# Patient Record
Sex: Male | Born: 1940 | Race: White | Hispanic: No | Smoking: Former smoker
Health system: Southern US, Community
[De-identification: ages and names within clinical notes are randomized; demographics above are authoritative.]

## PROBLEM LIST (undated history)

## (undated) DIAGNOSIS — C679 Malignant neoplasm of bladder, unspecified: Secondary | ICD-10-CM

## (undated) DIAGNOSIS — I10 Essential (primary) hypertension: Secondary | ICD-10-CM

## (undated) HISTORY — PX: BACK SURGERY: SHX140

## (undated) HISTORY — PX: CATARACT EXTRACTION: SUR2

## (undated) HISTORY — PX: HERNIA REPAIR: SHX51

## (undated) HISTORY — PX: TONSILLECTOMY: SUR1361

---

## 2011-06-16 DIAGNOSIS — C4449 Other specified malignant neoplasm of skin of scalp and neck: Secondary | ICD-10-CM | POA: Insufficient documentation

## 2012-08-08 DIAGNOSIS — M4712 Other spondylosis with myelopathy, cervical region: Secondary | ICD-10-CM | POA: Insufficient documentation

## 2017-05-18 DIAGNOSIS — N183 Chronic kidney disease, stage 3 unspecified: Secondary | ICD-10-CM | POA: Insufficient documentation

## 2017-05-18 DIAGNOSIS — Z0000 Encounter for general adult medical examination without abnormal findings: Secondary | ICD-10-CM | POA: Insufficient documentation

## 2017-05-18 DIAGNOSIS — E7800 Pure hypercholesterolemia, unspecified: Secondary | ICD-10-CM | POA: Insufficient documentation

## 2017-05-18 DIAGNOSIS — I10 Essential (primary) hypertension: Secondary | ICD-10-CM | POA: Insufficient documentation

## 2017-11-01 DIAGNOSIS — R7303 Prediabetes: Secondary | ICD-10-CM | POA: Insufficient documentation

## 2017-11-01 DIAGNOSIS — R739 Hyperglycemia, unspecified: Secondary | ICD-10-CM | POA: Insufficient documentation

## 2018-04-03 DIAGNOSIS — C4499 Other specified malignant neoplasm of skin, unspecified: Secondary | ICD-10-CM

## 2018-04-03 HISTORY — DX: Other specified malignant neoplasm of skin, unspecified: C44.99

## 2018-10-02 DIAGNOSIS — C4491 Basal cell carcinoma of skin, unspecified: Secondary | ICD-10-CM

## 2018-10-02 HISTORY — DX: Basal cell carcinoma of skin, unspecified: C44.91

## 2018-11-14 ENCOUNTER — Other Ambulatory Visit: Payer: Self-pay | Admitting: Physician Assistant

## 2018-11-14 DIAGNOSIS — R310 Gross hematuria: Secondary | ICD-10-CM

## 2018-11-23 ENCOUNTER — Ambulatory Visit
Admission: RE | Admit: 2018-11-23 | Discharge: 2018-11-23 | Disposition: A | Discharge status: Home / Self Care | Payer: Medicare Other | Source: Ambulatory Visit | Attending: Physician Assistant | Admitting: Physician Assistant

## 2018-11-23 DIAGNOSIS — R310 Gross hematuria: Secondary | ICD-10-CM | POA: Insufficient documentation

## 2018-11-23 HISTORY — DX: Essential (primary) hypertension: I10

## 2018-11-23 LAB — POCT I-STAT CREATININE: Creatinine, Ser: 0.9 mg/dL (ref 0.61–1.24)

## 2018-11-23 MED ORDER — IOPAMIDOL (ISOVUE-300) INJECTION 61%
125.0000 mL | Freq: Once | INTRAVENOUS | Status: AC | PRN
  Administered 2018-11-23: 125 mL via INTRAVENOUS

## 2018-12-01 ENCOUNTER — Encounter

## 2018-12-01 ENCOUNTER — Ambulatory Visit (INDEPENDENT_AMBULATORY_CARE_PROVIDER_SITE_OTHER): Payer: Medicare Other | Admitting: Urology

## 2018-12-01 ENCOUNTER — Ambulatory Visit
Admission: RE | Admit: 2018-12-01 | Discharge: 2018-12-01 | Disposition: A | Discharge status: Home / Self Care | Payer: Medicare Other | Source: Ambulatory Visit | Attending: Urology | Admitting: Urology

## 2018-12-01 ENCOUNTER — Other Ambulatory Visit: Payer: Self-pay

## 2018-12-01 ENCOUNTER — Ambulatory Visit
Admission: RE | Admit: 2018-12-01 | Discharge: 2018-12-01 | Disposition: A | Discharge status: Home / Self Care | Payer: Medicare Other | Attending: Urology | Admitting: Urology

## 2018-12-01 ENCOUNTER — Encounter: Payer: Self-pay | Admitting: Urology

## 2018-12-01 VITALS — BP 163/78 | HR 82 | Ht 68.0 in | Wt 191.0 lb

## 2018-12-01 DIAGNOSIS — D494 Neoplasm of unspecified behavior of bladder: Secondary | ICD-10-CM | POA: Diagnosis not present

## 2018-12-01 DIAGNOSIS — N3289 Other specified disorders of bladder: Secondary | ICD-10-CM

## 2018-12-01 LAB — URINALYSIS, COMPLETE
Bilirubin, UA: NEGATIVE
Glucose, UA: NEGATIVE
Ketones, UA: NEGATIVE
Nitrite, UA: NEGATIVE
Specific Gravity, UA: 1.025 (ref 1.005–1.030)
Urobilinogen, Ur: 1 mg/dL (ref 0.2–1.0)
pH, UA: 6.5 (ref 5.0–7.5)

## 2018-12-01 LAB — MICROSCOPIC EXAMINATION
Epithelial Cells (non renal): NONE SEEN /hpf (ref 0–10)
RBC, UA: >30 /hpf — ABNORMAL HIGH (ref 0–2)

## 2018-12-01 NOTE — Progress Notes (Signed)
12/01/2018 3:53 PM   [name] [birth year] [id number]  Referring provider: Kirk Ruths, MD [facility] [place],  [id number]  CC: Hematuria, bladder mass  HPI: I saw [name] in urology clinic today in consultation for hematuria and bladder mass from [place], [place]. he is a very healthy and active 78 year old male with past medical history notable for hypertension and hypercholesterolemia, and past surgical history notable for right inguinal hernia repair with mesh, who presents with intermittent gross hematuria since mid November.  He reports it worsened acutely in mid December which caused him to present to his primary at which time he was having eraser sized blood clots in the urine, however his urine has returned to light pink in color.  He reports some increased urinary frequency.  He has a 60-pack-year smoking history, and quit 25 years ago.  Denies any carcinogenic exposures.  He is retired, widowed, and lives alone in [place].  He denies any weight loss, chest pain, or shortness of breath.  There are no aggravating or alleviating factors.  Severity is moderate to severe.  CT urogram was performed 11/23/2018 which showed a large 7 cm anterior bladder mass.  There was no hydronephrosis or evidence of metastatic disease.  PSA 10/2018 was 1.64.  There is no family history of prostate or bladder cancer.   PMH: Past Medical History:  Diagnosis Date  . Hypertension     Surgical History: Past Surgical History:  Procedure Laterality Date  . BACK SURGERY    . CATARACT EXTRACTION    . HERNIA REPAIR    . TONSILLECTOMY      Allergies: No Known Allergies  Family History: History reviewed. No pertinent family history.  Social History:  reports that he quit smoking about 25 years ago. His smoking use included cigarettes. He quit after 35.00 years of use. He has never used smokeless tobacco. He reports current alcohol use. He  reports that he does not use drugs.  ROS: Please see flowsheet from today's date for complete review of systems.  Physical Exam: BP (!) 163/78   Pulse 82   Ht 5\' 8"  (1.727 m)   Wt 191 lb (86.6 kg)   BMI 29.04 kg/m    Constitutional:  Alert and oriented, No acute distress. Cardiovascular: Regular rate and rhythm Respiratory: Clear to auscultation bilaterally GI: Abdomen is soft, nontender, nondistended, no abdominal masses GU: No CVA tenderness Lymph: No cervical or inguinal lymphadenopathy. Skin: No rashes, bruises or suspicious lesions. Neurologic: Grossly intact, no focal deficits, moving all 4 extremities. Psychiatric: Normal mood and affect.  Laboratory Data: Reviewed  Pertinent Imaging: I have personally reviewed the CT urogram dated 11/23/2018.  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  There is no hydronephrosis, lymphadenopathy, or evidence of metastatic disease.  Chest x-ray today with no evidence of metastatic disease.  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  We had a long discussion today about these findings.  We discussed the next stage and diagnosis is a TURBT for pathology and to remove the mass.  We discussed the risks of surgery including bleeding, infection, need for temporary catheter placement up to 1 to 2 weeks, and bladder injury or perforation.  We discussed the next steps  in treatment will be based on pathology results with staging.  We discussed the difference between non-muscle invasive and muscle invasive bladder cancer, and typically endoscopic management plus or minus BCG or intravesical chemotherapy for non-muscle invasive disease, versus radical cystoprostatectomy with possible neoadjuvant chemotherapy or tri-modal therapy with  TURBT/chemotherapy/radiation for muscle invasive disease.  Schedule TURBT, plan to admit overnight  Billey Co, MD  [facility] [address], [place], [place] [address] [phone]

## 2018-12-01 NOTE — Patient Instructions (Signed)
Transurethral Resection of Bladder Tumor, Care After This sheet gives you information about how to care for yourself after your procedure. Your health care provider may also give you more specific instructions. If you have problems or questions, contact your health care provider. What can I expect after the procedure? After the procedure, it is common to have:  A small amount of blood in your urine for up to 2 weeks.  Soreness or mild pain from your catheter. After your catheter is removed, you may have mild soreness, especially when urinating.  Pain in your lower abdomen. Follow these instructions at home: Medicines   Take over-the-counter and prescription medicines only as told by your health care provider.  If you were prescribed an antibiotic medicine, take it as told by your health care provider. Do not stop taking the antibiotic even if you start to feel better.  Do not drive for 24 hours if you were given a sedative during your procedure.  Ask your health care provider if the medicine prescribed to you: ? Requires you to avoid driving or using heavy machinery. ? Can cause constipation. You may need to take these actions to prevent or treat constipation:  Take over-the-counter or prescription medicines.  Eat foods that are high in fiber, such as beans, whole grains, and fresh fruits and vegetables.  Limit foods that are high in fat and processed sugars, such as fried or sweet foods. Activity  Return to your normal activities as told by your health care provider. Ask your health care provider what activities are safe for you.  Do not lift anything that is heavier than 10 lb (4.5 kg), or the limit that you are told, until your health care provider says that it is safe.  Avoid intense physical activity for as long as told by your health care provider.  Rest as told by your health care provider.  Avoid sitting for a long time without moving. Get up to take short walks every  1-2 hours. This is important to improve blood flow and breathing. Ask for help if you feel weak or unsteady. General instructions   Do not drink alcohol for as long as told by your health care provider. This is especially important if you are taking prescription pain medicines.  Do not take baths, swim, or use a hot tub until your health care provider approves. Ask your health care provider if you may take showers. You may only be allowed to take sponge baths.  If you have a catheter, follow instructions from your health care provider about caring for your catheter and your drainage bag.  Drink enough fluid to keep your urine pale yellow.  Wear compression stockings as told by your health care provider. These stockings help to prevent blood clots and reduce swelling in your legs.  Keep all follow-up visits as told by your health care provider. This is important. ? You will need to be followed closely with regular checks of your bladder and urethra (cystoscopies) to make sure that the cancer does not come back. Contact a health care provider if:  You have pain that gets worse or does not improve with medicine.  You have blood in your urine for more than 2 weeks.  You have cloudy or bad-smelling urine.  You become constipated. Signs of constipation may include having: ? Fewer than three bowel movements in a week. ? Difficulty having a bowel movement. ? Stools that are dry, hard, or larger than normal.  You have  a fever. Get help right away if:  You have: ? Severe pain. ? Bright red blood in your urine. ? Blood clots in your urine. ? A lot of blood in your urine.  Your catheter has been removed and you are not able to urinate.  You have a catheter in place and the catheter is not draining urine. Summary  After your procedure, it is common to have a small amount of blood in your urine, soreness or mild pain from your catheter, and pain in your lower abdomen.  Take  over-the-counter and prescription medicines only as told by your health care provider.  Rest as told by your health care provider. Follow your health care provider's instructions about returning to normal activities. Ask what activities are safe for you.  If you have a catheter, follow instructions from your health care provider about caring for your catheter and your drainage bag.  Get help right away if you cannot urinate, you have severe pain, or you have bright red blood or blood clots in your urine. This information is not intended to replace advice given to you by your health care provider. Make sure you discuss any questions you have with your health care provider. Document Released: 10/27/2015 Document Revised: 06/15/2018 Document Reviewed: 06/15/2018 Elsevier Interactive Patient Education  2019 [employer] Transurethral Resection of Bladder Tumor  Transurethral resection of a bladder tumor is the removal (resection) of a cancerous growth (tumor) on the inside wall of the bladder. The bladder is the organ that holds urine. The tumor is removed through the tube that carries urine out of the body (urethra). In a transurethral resection, a thin telescope with a light, a tiny camera, and an electric cutting edge (resectoscope) is passed through the urethra. In men, the opening of the urethra is at the end of the penis. In women, it is just above the opening of the vagina. Tell a health care provider about:  Any allergies you have.  All medicines you are taking, including vitamins, herbs, eye drops, creams, and over-the-counter medicines.  Any problems you or family members have had with anesthetic medicines.  Any blood disorders you have.  Any surgeries you have had.  Any medical conditions you have.  Any recent urinary tract infections you have had.  Whether you are pregnant or may be pregnant. What are the risks? Generally, this is a safe procedure. However, problems may  occur, including:  Infection.  Bleeding.  Allergic reactions to medicines.  Damage to nearby structures or organs, such as: ? The urethra. ? The tubes that drain urine from the kidneys into the bladder (ureters).  Pain and burning during urination.  Difficulty urinating due to partial blockage of the urethra.  Inability to urinate (urinary retention). What happens before the procedure? Staying hydrated Follow instructions from your health care provider about hydration, which may include:  Up to 2 hours before the procedure - you may continue to drink clear liquids, such as water, clear fruit juice, black coffee, and plain tea.  Eating and drinking restrictions Follow instructions from your health care provider about eating and drinking, which may include:  8 hours before the procedure - stop eating heavy meals or foods, such as meat, fried foods, or fatty foods.  6 hours before the procedure - stop eating light meals or foods, such as toast or cereal.  6 hours before the procedure - stop drinking milk or drinks that contain milk.  2 hours before the procedure - stop   drinking clear liquids. Medicines Ask your health care provider about:  Changing or stopping your regular medicines. This is especially important if you are taking diabetes medicines or blood thinners.  Taking medicines such as aspirin and ibuprofen. These medicines can thin your blood. Do not take these medicines unless your health care provider tells you to take them.  Taking over-the-counter medicines, vitamins, herbs, and supplements. Tests You may have exams or tests, including:  Physical exam.  Blood tests.  Urine tests.  Electrocardiogram (ECG). This test measures the electrical activity of the heart. General instructions  Plan to have someone take you home from the hospital or clinic.  Ask your health care provider how your surgical site will be marked or identified.  Ask your health care  provider what steps will be taken to help prevent infection. These may include: ? Washing skin with a germ-killing soap. ? Taking antibiotic medicine. What happens during the procedure?  An IV will be inserted into one of your veins.  You will be given one or more of the following: ? A medicine to help you relax (sedative). ? A medicine to make you fall asleep (general anesthetic). ? A medicine that is injected into your spine to numb the area below and slightly above the injection site (spinal anesthetic).  Your legs will be placed in foot rests (stirrups) so that your legs are apart and your knees are bent.  The resectoscope will be passed through your urethra and into your bladder.  The part of your bladder that is affected by the tumor will be resected using the cutting edge of the resectoscope.  The resectoscope will be removed.  A thin, flexible tube (catheter) will be passed through your urethra and into your bladder. The catheter will drain urine into a bag outside of your body. ? Fluid may be passed through the catheter to keep the catheter open. The procedure may vary among health care providers and hospitals. What happens after the procedure?  Your blood pressure, heart rate, breathing rate, and blood oxygen level will be monitored until you leave the hospital or clinic.  You may continue to receive fluids and medicines through an IV.  You will have some pain. You will be given pain medicine to relieve pain.  You will have a catheter to drain your urine. ? You will have blood in your urine. Your catheter may be kept in until your urine is clear. ? The amount of urine will be monitored. If necessary, your bladder may be rinsed out (irrigated) by passing fluid through your catheter.  You will be encouraged to walk around as soon as possible.  You may have to wear compression stockings. These stockings help to prevent blood clots and reduce swelling in your legs.  Do  not drive for 24 hours if you were given a sedative during your procedure. Summary  Transurethral resection of a bladder tumor is the removal (resection) of a cancerous growth (tumor) on the inside wall of the bladder.  To do this procedure, your health care provider uses a thin telescope with a light, a tiny camera, and an electric cutting edge (resectoscope).  Follow your health care provider's instructions. You may need to stop or change certain medicines, and you may be told to stop eating and drinking several hours before the procedure.  Your blood pressure, heart rate, breathing rate, and blood oxygen level will be monitored until you leave the hospital or clinic.  You may have to wear   compression stockings. These stockings help to prevent blood clots and reduce swelling in your legs. This information is not intended to replace advice given to you by your health care provider. Make sure you discuss any questions you have with your health care provider. Document Released: 09/11/2009 Document Revised: 06/16/2018 Document Reviewed: 06/16/2018 Elsevier Interactive Patient Education  2019 Elsevier Inc.  

## 2018-12-04 ENCOUNTER — Other Ambulatory Visit: Payer: Self-pay | Admitting: Radiology

## 2018-12-04 ENCOUNTER — Encounter
Admission: RE | Admit: 2018-12-04 | Discharge: 2018-12-04 | Disposition: A | Discharge status: Home / Self Care | Payer: Medicare Other | Source: Ambulatory Visit | Attending: Urology | Admitting: Urology

## 2018-12-04 ENCOUNTER — Other Ambulatory Visit: Payer: Medicare Other

## 2018-12-04 ENCOUNTER — Other Ambulatory Visit: Payer: Self-pay

## 2018-12-04 DIAGNOSIS — D494 Neoplasm of unspecified behavior of bladder: Secondary | ICD-10-CM

## 2018-12-04 LAB — URINALYSIS, COMPLETE
Bilirubin, UA: NEGATIVE
GLUCOSE, UA: NEGATIVE
Ketones, UA: NEGATIVE
Nitrite, UA: NEGATIVE
Protein, UA: NEGATIVE
Specific Gravity, UA: 1.02 (ref 1.005–1.030)
Urobilinogen, Ur: 1 mg/dL (ref 0.2–1.0)
pH, UA: 7 (ref 5.0–7.5)

## 2018-12-04 LAB — MICROSCOPIC EXAMINATION: RBC, UA: >30 /hpf — ABNORMAL HIGH (ref 0–2)

## 2018-12-04 NOTE — Patient Instructions (Signed)
Your procedure is scheduled on: 12/08/18 Report to Day Surgery. [facility] SECOND FLOOR To find out your arrival time please call [phone] between 1PM - 3PM on 12/07/18.  Remember: Instructions that are not followed completely may result in serious medical risk,  up to and including death, or upon the discretion of your surgeon and anesthesiologist your  surgery may need to be rescheduled.     _X__ 1. Do not eat food after midnight the night before your procedure.                 No gum chewing or hard candies. You may drink clear liquids up to 2 hours                 before you are scheduled to arrive for your surgery- DO not drink clear                 liquids within 2 hours of the start of your surgery.                 Clear Liquids include:  water, apple juice without pulp, clear carbohydrate                 drink such as Clearfast of Gatorade, Black Coffee or Tea (Do not add                 anything to coffee or tea).  __X__2.  On the morning of surgery brush your teeth with toothpaste and water, you                may rinse your mouth with mouthwash if you wish.  Do not swallow any toothpaste of mouthwash.     _X__ 3.  No Alcohol for 24 hours before or after surgery.   _X__ 4.  Do Not Smoke or use e-cigarettes For 24 Hours Prior to Your Surgery.                 Do not use any chewable tobacco products for at least 6 hours prior to                 surgery.  ____  5.  Bring all medications with you on the day of surgery if instructed.   _X_  6.  Notify your doctor if there is any change in your medical condition      (cold, fever, infections).     Do not wear jewelry, make-up, hairpins, clips or nail polish. Do not wear lotions, powders, or perfumes. You may wear deodorant. Do not shave 48 hours prior to surgery. Men may shave face and neck. Do not bring valuables to the hospital.    [facility] is not responsible for any belongings or  valuables.  Contacts, dentures or bridgework may not be worn into surgery. Leave your suitcase in the car. After surgery it may be brought to your room. For patients admitted to the hospital, discharge time is determined by your treatment team.   Patients discharged the day of surgery will not be allowed to drive home.   _X___ Take these medicines the morning of surgery with A SIP OF WATER:    1. NONE  2.   3.   4.  5.  6.  ____ Fleet Enema (as directed)   ____ Use CHG Soap as directed  ____ Use inhalers on the day of surgery  ____ Stop metformin 2 days prior to surgery  ____ Take 1/2 of usual insulin dose the night before surgery. No insulin the morning          of surgery.   _X___ Stop Coumadin/Plavix/aspirin on  ASPIRIN ALREADY ON HOLD FOR SURGERY  ____ Stop Anti-inflammatories on   ____ Stop supplements until after surgery.    ____ Bring C-Pap to the hospital.

## 2018-12-05 ENCOUNTER — Encounter
Admission: RE | Admit: 2018-12-05 | Discharge: 2018-12-05 | Disposition: A | Discharge status: Home / Self Care | Payer: Medicare Other | Source: Ambulatory Visit | Attending: Urology | Admitting: Urology

## 2018-12-05 DIAGNOSIS — M4712 Other spondylosis with myelopathy, cervical region: Secondary | ICD-10-CM | POA: Diagnosis not present

## 2018-12-05 DIAGNOSIS — E7800 Pure hypercholesterolemia, unspecified: Secondary | ICD-10-CM | POA: Diagnosis not present

## 2018-12-05 DIAGNOSIS — Z7982 Long term (current) use of aspirin: Secondary | ICD-10-CM | POA: Diagnosis not present

## 2018-12-05 DIAGNOSIS — I10 Essential (primary) hypertension: Secondary | ICD-10-CM

## 2018-12-05 DIAGNOSIS — R9431 Abnormal electrocardiogram [ECG] [EKG]: Secondary | ICD-10-CM

## 2018-12-05 DIAGNOSIS — C679 Malignant neoplasm of bladder, unspecified: Secondary | ICD-10-CM | POA: Diagnosis present

## 2018-12-05 DIAGNOSIS — C673 Malignant neoplasm of anterior wall of bladder: Secondary | ICD-10-CM | POA: Diagnosis not present

## 2018-12-05 DIAGNOSIS — R739 Hyperglycemia, unspecified: Secondary | ICD-10-CM | POA: Diagnosis not present

## 2018-12-05 DIAGNOSIS — R310 Gross hematuria: Secondary | ICD-10-CM | POA: Diagnosis not present

## 2018-12-05 DIAGNOSIS — Z79899 Other long term (current) drug therapy: Secondary | ICD-10-CM | POA: Diagnosis not present

## 2018-12-05 NOTE — Pre-Procedure Instructions (Signed)
REQUEST FOR CLEARANCE / EKG CALLED AND FAXED TO DR Ouida Sills. ALSO MESSAGED QAMY AT DR Diamantina Providence

## 2018-12-06 LAB — CULTURE, URINE COMPREHENSIVE

## 2018-12-07 ENCOUNTER — Inpatient Hospital Stay: Admission: RE | Admit: 2018-12-07 | Payer: Medicare Other | Source: Ambulatory Visit

## 2018-12-07 MED ORDER — CEFAZOLIN SODIUM-DEXTROSE 2-4 GM/100ML-% IV SOLN
2.0000 g | Freq: Once | INTRAVENOUS | Status: AC
  Administered 2018-12-08: 2000 mg via INTRAVENOUS

## 2018-12-07 NOTE — Pre-Procedure Instructions (Signed)
REQUESTED DR ANDERSON'S OFFICE FAX CLREANCE TO DR Diamantina Providence AND TO [facility] INSTEAD OF [facility]. BEING SEEN LATE AFTERNOON

## 2018-12-08 ENCOUNTER — Ambulatory Visit: Payer: Medicare Other | Admitting: Anesthesiology

## 2018-12-08 ENCOUNTER — Encounter: Payer: Self-pay | Admitting: *Deleted

## 2018-12-08 ENCOUNTER — Observation Stay
Admission: RE | Admit: 2018-12-08 | Discharge: 2018-12-09 | Disposition: A | Discharge status: Home / Self Care | Payer: Medicare Other | Source: Ambulatory Visit | Attending: Urology | Admitting: Urology

## 2018-12-08 ENCOUNTER — Other Ambulatory Visit: Payer: Self-pay

## 2018-12-08 ENCOUNTER — Encounter
Admission: RE | Disposition: A | Discharge status: Home / Self Care | Payer: Self-pay | Source: Ambulatory Visit | Attending: Urology

## 2018-12-08 DIAGNOSIS — R310 Gross hematuria: Secondary | ICD-10-CM | POA: Insufficient documentation

## 2018-12-08 DIAGNOSIS — C673 Malignant neoplasm of anterior wall of bladder: Principal | ICD-10-CM | POA: Insufficient documentation

## 2018-12-08 DIAGNOSIS — Z7982 Long term (current) use of aspirin: Secondary | ICD-10-CM | POA: Insufficient documentation

## 2018-12-08 DIAGNOSIS — C679 Malignant neoplasm of bladder, unspecified: Secondary | ICD-10-CM | POA: Diagnosis present

## 2018-12-08 DIAGNOSIS — M4712 Other spondylosis with myelopathy, cervical region: Secondary | ICD-10-CM | POA: Insufficient documentation

## 2018-12-08 DIAGNOSIS — D494 Neoplasm of unspecified behavior of bladder: Secondary | ICD-10-CM

## 2018-12-08 DIAGNOSIS — I10 Essential (primary) hypertension: Secondary | ICD-10-CM | POA: Insufficient documentation

## 2018-12-08 DIAGNOSIS — R739 Hyperglycemia, unspecified: Secondary | ICD-10-CM | POA: Insufficient documentation

## 2018-12-08 DIAGNOSIS — E7800 Pure hypercholesterolemia, unspecified: Secondary | ICD-10-CM | POA: Insufficient documentation

## 2018-12-08 DIAGNOSIS — Z79899 Other long term (current) drug therapy: Secondary | ICD-10-CM | POA: Insufficient documentation

## 2018-12-08 HISTORY — PX: TRANSURETHRAL RESECTION OF BLADDER TUMOR: SHX2575

## 2018-12-08 LAB — TYPE AND SCREEN
ABO/RH(D): A POS
Antibody Screen: NEGATIVE

## 2018-12-08 LAB — ABO/RH: ABO/RH(D): A POS

## 2018-12-08 SURGERY — TURBT (TRANSURETHRAL RESECTION OF BLADDER TUMOR)
Anesthesia: General

## 2018-12-08 MED ORDER — LISINOPRIL-HYDROCHLOROTHIAZIDE 20-12.5 MG PO TABS: 1.0000 | ORAL_TABLET | Freq: Every day | ORAL | Status: DC

## 2018-12-08 MED ORDER — HYDROMORPHONE HCL 1 MG/ML IJ SOLN
INTRAMUSCULAR | Status: DC | PRN
  Administered 2018-12-08: 0.5 mg via INTRAVENOUS

## 2018-12-08 MED ORDER — HYDROCHLOROTHIAZIDE 12.5 MG PO CAPS
12.5000 mg | ORAL_CAPSULE | Freq: Every day | ORAL | Status: DC
  Administered 2018-12-08 – 2018-12-09 (×2): 12.5 mg via ORAL
  Filled 2018-12-08 (×2): qty 1

## 2018-12-08 MED ORDER — SODIUM CHLORIDE 0.9% FLUSH: 3.0000 mL | INTRAVENOUS | Status: DC | PRN

## 2018-12-08 MED ORDER — FENTANYL CITRATE (PF) 100 MCG/2ML IJ SOLN
INTRAMUSCULAR | Status: AC
  Filled 2018-12-08: qty 2

## 2018-12-08 MED ORDER — FENTANYL CITRATE (PF) 100 MCG/2ML IJ SOLN: 25.0000 ug | INTRAMUSCULAR | Status: DC | PRN

## 2018-12-08 MED ORDER — ONDANSETRON HCL 4 MG/2ML IJ SOLN: 4.0000 mg | INTRAMUSCULAR | Status: DC | PRN

## 2018-12-08 MED ORDER — SODIUM CHLORIDE 0.9% FLUSH
3.0000 mL | Freq: Two times a day (BID) | INTRAVENOUS | Status: DC
  Administered 2018-12-08: 3 mL via INTRAVENOUS

## 2018-12-08 MED ORDER — CEFAZOLIN SODIUM-DEXTROSE 2-4 GM/100ML-% IV SOLN
INTRAVENOUS | Status: AC
  Filled 2018-12-08: qty 100

## 2018-12-08 MED ORDER — BELLADONNA ALKALOIDS-OPIUM 16.2-60 MG RE SUPP
1.0000 | Freq: Four times a day (QID) | RECTAL | Status: DC | PRN
  Administered 2018-12-08: 1 via RECTAL

## 2018-12-08 MED ORDER — FENTANYL CITRATE (PF) 100 MCG/2ML IJ SOLN
INTRAMUSCULAR | Status: DC | PRN
  Administered 2018-12-08: 50 ug via INTRAVENOUS

## 2018-12-08 MED ORDER — LISINOPRIL 20 MG PO TABS
20.0000 mg | ORAL_TABLET | Freq: Every day | ORAL | Status: DC
  Administered 2018-12-08 – 2018-12-09 (×2): 20 mg via ORAL
  Filled 2018-12-08 (×2): qty 1

## 2018-12-08 MED ORDER — SENNOSIDES-DOCUSATE SODIUM 8.6-50 MG PO TABS: 1.0000 | ORAL_TABLET | Freq: Every evening | ORAL | Status: DC | PRN

## 2018-12-08 MED ORDER — GLYCOPYRROLATE 0.2 MG/ML IJ SOLN
INTRAMUSCULAR | Status: DC | PRN
  Administered 2018-12-08: 0.1 mg via INTRAVENOUS

## 2018-12-08 MED ORDER — ROCURONIUM BROMIDE 100 MG/10ML IV SOLN
INTRAVENOUS | Status: DC | PRN
  Administered 2018-12-08: 20 mg via INTRAVENOUS
  Administered 2018-12-08: 50 mg via INTRAVENOUS
  Administered 2018-12-08: 10 mg via INTRAVENOUS

## 2018-12-08 MED ORDER — LIDOCAINE HCL (PF) 2 % IJ SOLN
INTRAMUSCULAR | Status: AC
  Filled 2018-12-08: qty 10

## 2018-12-08 MED ORDER — SULFAMETHOXAZOLE-TRIMETHOPRIM 800-160 MG PO TABS: 1.0000 | ORAL_TABLET | Freq: Every day | ORAL | 0 refills | Status: DC

## 2018-12-08 MED ORDER — HYDROCODONE-ACETAMINOPHEN 5-325 MG PO TABS: 1.0000 | ORAL_TABLET | ORAL | 0 refills | Status: AC | PRN

## 2018-12-08 MED ORDER — FAMOTIDINE 20 MG PO TABS
20.0000 mg | ORAL_TABLET | Freq: Once | ORAL | Status: AC
  Administered 2018-12-08: 20 mg via ORAL

## 2018-12-08 MED ORDER — SUGAMMADEX SODIUM 500 MG/5ML IV SOLN
INTRAVENOUS | Status: DC | PRN
  Administered 2018-12-08: 340 mg via INTRAVENOUS

## 2018-12-08 MED ORDER — ALBUMIN HUMAN 5 % IV SOLN
INTRAVENOUS | Status: AC
  Filled 2018-12-08: qty 500

## 2018-12-08 MED ORDER — BELLADONNA ALKALOIDS-OPIUM 16.2-60 MG RE SUPP
RECTAL | Status: AC
  Administered 2018-12-08: 1 via RECTAL
  Filled 2018-12-08: qty 1

## 2018-12-08 MED ORDER — DEXAMETHASONE SODIUM PHOSPHATE 10 MG/ML IJ SOLN
INTRAMUSCULAR | Status: DC | PRN
  Administered 2018-12-08: 10 mg via INTRAVENOUS

## 2018-12-08 MED ORDER — SODIUM CHLORIDE 0.9 % IV SOLN: 250.0000 mL | INTRAVENOUS | Status: DC | PRN

## 2018-12-08 MED ORDER — EPHEDRINE SULFATE 50 MG/ML IJ SOLN
INTRAMUSCULAR | Status: AC
  Filled 2018-12-08: qty 1

## 2018-12-08 MED ORDER — HYDROMORPHONE HCL 1 MG/ML IJ SOLN
INTRAMUSCULAR | Status: AC
  Filled 2018-12-08: qty 1

## 2018-12-08 MED ORDER — PROMETHAZINE HCL 25 MG/ML IJ SOLN: 6.2500 mg | INTRAMUSCULAR | Status: DC | PRN

## 2018-12-08 MED ORDER — OXYCODONE HCL 5 MG PO TABS: 5.0000 mg | ORAL_TABLET | Freq: Once | ORAL | Status: DC | PRN

## 2018-12-08 MED ORDER — MIDAZOLAM HCL 2 MG/2ML IJ SOLN
INTRAMUSCULAR | Status: AC
  Filled 2018-12-08: qty 2

## 2018-12-08 MED ORDER — FAMOTIDINE 20 MG PO TABS
ORAL_TABLET | ORAL | Status: AC
  Administered 2018-12-08: 20 mg via ORAL
  Filled 2018-12-08: qty 1

## 2018-12-08 MED ORDER — EPHEDRINE SULFATE 50 MG/ML IJ SOLN
INTRAMUSCULAR | Status: DC | PRN
  Administered 2018-12-08 (×5): 5 mg via INTRAVENOUS

## 2018-12-08 MED ORDER — ROCURONIUM BROMIDE 50 MG/5ML IV SOLN
INTRAVENOUS | Status: AC
  Filled 2018-12-08: qty 1

## 2018-12-08 MED ORDER — MIDAZOLAM HCL 2 MG/2ML IJ SOLN
INTRAMUSCULAR | Status: DC | PRN
  Administered 2018-12-08: 2 mg via INTRAVENOUS

## 2018-12-08 MED ORDER — ACETAMINOPHEN 325 MG PO TABS: 650.0000 mg | ORAL_TABLET | ORAL | Status: DC | PRN

## 2018-12-08 MED ORDER — LACTATED RINGERS IV SOLN
INTRAVENOUS | Status: DC
  Administered 2018-12-08: 13:00:00 via INTRAVENOUS

## 2018-12-08 MED ORDER — ONDANSETRON HCL 4 MG/2ML IJ SOLN
INTRAMUSCULAR | Status: AC
  Filled 2018-12-08: qty 2

## 2018-12-08 MED ORDER — DEXAMETHASONE SODIUM PHOSPHATE 10 MG/ML IJ SOLN
INTRAMUSCULAR | Status: AC
  Filled 2018-12-08: qty 1

## 2018-12-08 MED ORDER — HYDROCODONE-ACETAMINOPHEN 5-325 MG PO TABS
1.0000 | ORAL_TABLET | ORAL | Status: DC | PRN
  Administered 2018-12-08 (×2): 2 via ORAL
  Administered 2018-12-09 (×2): 1 via ORAL
  Filled 2018-12-08: qty 2
  Filled 2018-12-08: qty 1
  Filled 2018-12-08 (×2): qty 2

## 2018-12-08 MED ORDER — ONDANSETRON HCL 4 MG/2ML IJ SOLN
INTRAMUSCULAR | Status: DC | PRN
  Administered 2018-12-08: 4 mg via INTRAVENOUS

## 2018-12-08 MED ORDER — HYDROMORPHONE HCL 1 MG/ML IJ SOLN: 0.5000 mg | INTRAMUSCULAR | Status: DC | PRN

## 2018-12-08 MED ORDER — PROPOFOL 10 MG/ML IV BOLUS
INTRAVENOUS | Status: DC | PRN
  Administered 2018-12-08: 150 mg via INTRAVENOUS

## 2018-12-08 MED ORDER — ATORVASTATIN CALCIUM 20 MG PO TABS
20.0000 mg | ORAL_TABLET | Freq: Every day | ORAL | Status: DC
  Administered 2018-12-08: 20 mg via ORAL
  Filled 2018-12-08 (×2): qty 1

## 2018-12-08 MED ORDER — MEPERIDINE HCL 50 MG/ML IJ SOLN: 6.2500 mg | INTRAMUSCULAR | Status: DC | PRN

## 2018-12-08 MED ORDER — OXYCODONE HCL 5 MG/5ML PO SOLN: 5.0000 mg | Freq: Once | ORAL | Status: DC | PRN

## 2018-12-08 MED ORDER — KETAMINE HCL 50 MG/ML IJ SOLN
INTRAMUSCULAR | Status: AC
  Filled 2018-12-08: qty 10

## 2018-12-08 MED ORDER — LIDOCAINE HCL (CARDIAC) PF 100 MG/5ML IV SOSY
PREFILLED_SYRINGE | INTRAVENOUS | Status: DC | PRN
  Administered 2018-12-08: 100 mg via INTRAVENOUS

## 2018-12-08 SURGICAL SUPPLY — 33 items
BAG DRAIN CYSTO-URO LG1000N (MISCELLANEOUS) ×3 IMPLANT
BAG URINE DRAINAGE (UROLOGICAL SUPPLIES) ×3 IMPLANT
BRUSH SCRUB EZ  4% CHG (MISCELLANEOUS) ×2
BRUSH SCRUB EZ 4% CHG (MISCELLANEOUS) ×1 IMPLANT
CATH FOL 2WAY LX 20X30 (CATHETERS) ×2 IMPLANT
CATH FOLEY 2WAY  5CC 16FR (CATHETERS)
CATH URTH 16FR FL 2W BLN LF (CATHETERS) ×1 IMPLANT
CRADLE LAMINECT ARM (MISCELLANEOUS) ×3 IMPLANT
DRAPE UTILITY 15X26 TOWEL STRL (DRAPES) ×3 IMPLANT
DRSG TELFA 4X3 1S NADH ST (GAUZE/BANDAGES/DRESSINGS) ×3 IMPLANT
ELECT BIVAP BIPO 22/24 DONUT (ELECTROSURGICAL) ×3
ELECT LOOP 22F BIPOLAR SML (ELECTROSURGICAL)
ELECT REM PT RETURN 9FT ADLT (ELECTROSURGICAL)
ELECTRD BIVAP BIPO 22/24 DONUT (ELECTROSURGICAL) IMPLANT
ELECTRODE LOOP 22F BIPOLAR SML (ELECTROSURGICAL) IMPLANT
ELECTRODE REM PT RTRN 9FT ADLT (ELECTROSURGICAL) IMPLANT
GLOVE BIOGEL PI IND STRL 7.5 (GLOVE) ×1 IMPLANT
GLOVE BIOGEL PI INDICATOR 7.5 (GLOVE) ×4
GOWN STRL REUS W/ TWL LRG LVL3 (GOWN DISPOSABLE) ×1 IMPLANT
GOWN STRL REUS W/ TWL XL LVL3 (GOWN DISPOSABLE) ×1 IMPLANT
GOWN STRL REUS W/TWL LRG LVL3 (GOWN DISPOSABLE) ×2
GOWN STRL REUS W/TWL XL LVL3 (GOWN DISPOSABLE) ×2
HOLDER FOLEY CATH W/STRAP (MISCELLANEOUS) ×2 IMPLANT
IV NS IRRIG 3000ML ARTHROMATIC (IV SOLUTION) ×20 IMPLANT
KIT TURNOVER CYSTO (KITS) ×3 IMPLANT
LOOP CUT BIPOLAR 24F LRG (ELECTROSURGICAL) ×2 IMPLANT
NDL SAFETY ECLIPSE 18X1.5 (NEEDLE) ×1 IMPLANT
NEEDLE HYPO 18GX1.5 SHARP (NEEDLE)
PACK CYSTO AR (MISCELLANEOUS) ×3 IMPLANT
SET IRRIG Y TYPE TUR BLADDER L (SET/KITS/TRAYS/PACK) ×3 IMPLANT
SURGILUBE 2OZ TUBE FLIPTOP (MISCELLANEOUS) ×3 IMPLANT
SYRINGE IRR TOOMEY STRL 70CC (SYRINGE) ×3 IMPLANT
WATER STERILE IRR 1000ML POUR (IV SOLUTION) ×3 IMPLANT

## 2018-12-08 NOTE — Anesthesia Procedure Notes (Signed)
Procedure Name: Intubation Date/Time: 12/08/2018 12:37 PM Performed by: Kelton Pillar, CRNA Pre-anesthesia Checklist: Patient identified, Emergency Drugs available, Suction available and Patient being monitored Patient Re-evaluated:Patient Re-evaluated prior to induction Oxygen Delivery Method: Circle system utilized Preoxygenation: Pre-oxygenation with 100% oxygen Induction Type: IV induction Ventilation: Oral airway inserted - appropriate to patient size and Two handed mask ventilation required Laryngoscope Size: McGraph and 3 Grade View: Grade I Tube type: Oral Tube size: 7.0 mm Number of attempts: 1 Airway Equipment and Method: Stylet Placement Confirmation: ETT inserted through vocal cords under direct vision,  positive ETCO2,  CO2 detector and breath sounds checked- equal and bilateral Secured at: 22 cm Tube secured with: Tape Dental Injury: Teeth and Oropharynx as per pre-operative assessment  Difficulty Due To: Difficult Airway- due to reduced neck mobility

## 2018-12-08 NOTE — Transfer of Care (Signed)
Immediate Anesthesia Transfer of Care Note  Patient: Philip [name]  Procedure(s) Performed: TRANSURETHRAL RESECTION OF BLADDER TUMOR (TURBT) (N/A )  Patient Location: PACU  Anesthesia Type:General  Level of Consciousness: awake, alert , oriented and drowsy  Airway & Oxygen Therapy: Patient connected to nasal cannula oxygen  Post-op Assessment: Report given to RN and Post -op Vital signs reviewed and stable  Post vital signs: Reviewed and stable  Last Vitals:  Vitals Value Taken Time  BP 144/73 12/08/2018  2:23 PM  Temp 36.3 C 12/08/2018  2:23 PM  Pulse 74 12/08/2018  2:28 PM  Resp 16 12/08/2018  2:28 PM  SpO2 100 % 12/08/2018  2:28 PM  Vitals shown include unvalidated device data.  Last Pain:  Vitals:   12/08/18 1423  TempSrc:   PainSc: 0-No pain         Complications: No apparent anesthesia complications

## 2018-12-08 NOTE — H&P (Signed)
UROLOGY H&P UPDATE  Agree with prior H&P dated one 12/01/2018.  78 year old healthy male who presented with gross hematuria and was found to have a large 8 cm anterior wall bladder mass on CT, with no evidence of metastatic disease.  Chest x-ray was also negative for metastatic disease.  Cardiac: RRR Lungs: CTA bilaterally  Laterality: N/A Procedure: Transurethral resection of bladder tumor  Urinalysis: Urine culture 1/6 no growth  Informed consent obtained, we specifically discussed the risks of bleeding, infection, need for temporary and possible prolonged Foley catheter placement, blood transfusion, bladder perforation, and need for additional procedures.  We specifically discussed the need for tissue pathology to evaluate grade and stage.  We again reviewed the differences between non-muscle invasive and muscle invasive bladder cancer, and different management pathways.  Our goal today is to completely resect the tumor, however with its location at the anterior wall and dome this may not be safely possible.  Billey Co, MD 12/08/2018

## 2018-12-08 NOTE — Anesthesia Preprocedure Evaluation (Signed)
Anesthesia Evaluation  Patient identified by MRN, date of birth, ID band Patient awake    Reviewed: Allergy & Precautions, NPO status , Patient's Chart, lab work & pertinent test results  History of Anesthesia Complications Negative for: history of anesthetic complications  Airway Mallampati: III  TM Distance: >3 FB Neck ROM: Full    Dental  (+) Partial Upper   Pulmonary neg sleep apnea, neg COPD, former smoker,    breath sounds clear to auscultation- rhonchi (-) wheezing      Cardiovascular hypertension, Pt. on medications (-) CAD, (-) Past MI, (-) Cardiac Stents and (-) CABG  Rhythm:Regular Rate:Normal - Systolic murmurs and - Diastolic murmurs    Neuro/Psych neg Seizures negative neurological ROS  negative psych ROS   GI/Hepatic negative GI ROS, Neg liver ROS,   Endo/Other  negative endocrine ROSneg diabetes  Renal/GU negative Renal ROS     Musculoskeletal negative musculoskeletal ROS (+)   Abdominal (+) - obese,   Peds  Hematology negative hematology ROS (+)   Anesthesia Other Findings Past Medical History: No date: Hypertension   Reproductive/Obstetrics                             Anesthesia Physical Anesthesia Plan  ASA: II  Anesthesia Plan: General   Post-op Pain Management:    Induction: Intravenous  PONV Risk Score and Plan: 1 and Ondansetron  Airway Management Planned: Oral ETT  Additional Equipment:   Intra-op Plan:   Post-operative Plan: Extubation in OR  Informed Consent: I have reviewed the patients History and Physical, chart, labs and discussed the procedure including the risks, benefits and alternatives for the proposed anesthesia with the patient or authorized representative who has indicated his/her understanding and acceptance.   Dental advisory given  Plan Discussed with: CRNA and Anesthesiologist  Anesthesia Plan Comments:          Anesthesia Quick Evaluation

## 2018-12-08 NOTE — Op Note (Signed)
Date of procedure: 12/08/18  Preoperative diagnosis:  1. Bladder tumor  Postoperative diagnosis:  1. Same  Procedure: 1. TURBT large, greater than 5 cm  Surgeon: Nickolas Madrid, MD  Anesthesia: General  Complications: None  Intraoperative findings:  1.  Normal urethra, moderate size prostate, ureteral orifices extremely close to bladder neck 2.  Large anterior wall bladder tumor projecting into bladder lumen, few small satellite lesions at right lateral wall 3.  All visible tumor resected down to muscle 4.  Excellent hemostasis  EBL: 50 cc  Specimens:  1.  Bladder tumor superficial resection 2.  Bladder tumor deep resection 3.  Deep cold cup muscle biopsy  Drains: 20 French two-way Foley  Indication: Philip Carducci is a 78 y.o. patient with 2 months of gross hematuria found to have a large 7 cm anterior wall bladder tumor on CT urogram.  He presents today for TURBT for tissue diagnosis and management.  We discussed the risks and benefits at length including bleeding, infection, bladder perforation, need for temporary or prolonged Foley placement, need for additional procedures, and further management based on tumor grade and stage.  We discussed at length preoperatively the differences between non-muscle invasive and muscle invasive bladder cancer and the different treatment pathways.  Description of procedure:  The patient was taken to the operating room and general anesthesia was induced.  The patient was placed in the dorsal lithotomy position, prepped and draped in the usual sterile fashion, and preoperative antibiotics were administered. A preoperative time-out was performed.   A 21 French rigid cystoscope was used to intubate the urethra.  A normal-appearing urethra was followed proximally into the bladder.  The prostate was moderate in size with lateral lobe hypertrophy.  The ureteral orifices were extremely close to the bladder neck.  There was a large 7 cm bladder tumor on  the anterior wall that projected into the bladder lumen.  This was papillary and irregular, and hypervascular.  There were also 2 small satellite tumors at the right bladder wall less than 1 cm in size.  The visual obturator was used to guide the bipolar resectoscope into the bladder.  The large resecting loop was used to methodically resect the tumor in systematic fashion.  When we had remove the bulk of the tumor, all chips were irrigated free and sent as superficial bladder tumor.  Using the resecting loop, we then resected down to the muscle, and crossing fibers could be seen.  At no point was there concern for any significant perforation.  The second specimen was sent as deep bladder tumor resection.  The cold cup biopsy forceps were then used to take deep biopsies at the anterior wall.  The bipolar button was then used to achieve excellent hemostasis in the tumor bed.  There was no visible tumor present at the conclusion of the case.  With the bladder completely decompressed, there was no active bleeding noted and urine was faint pink.  A 20 French Foley catheter passed easily into the bladder with return of clear fluid.  15 cc were placed in the balloon.  The catheter irrigated easily, and urine was clear at the conclusion of the case.  Disposition: Stable to PACU  Plan: Admit overnight, maintain Foley catheter Follow-up in clinic in 7 to 10 days for Foley removal and discuss pathology  Nickolas Madrid, MD

## 2018-12-08 NOTE — Anesthesia Post-op Follow-up Note (Signed)
Anesthesia QCDR form completed.        

## 2018-12-09 DIAGNOSIS — D494 Neoplasm of unspecified behavior of bladder: Secondary | ICD-10-CM | POA: Diagnosis not present

## 2018-12-09 DIAGNOSIS — C673 Malignant neoplasm of anterior wall of bladder: Secondary | ICD-10-CM | POA: Diagnosis not present

## 2018-12-09 NOTE — Progress Notes (Signed)
Patient and daughter were educated on care of urinary catheter at home, including how to switch from leg to overnight bag, how to empty, how to clean, and signs and symptoms of infection. Printed information was also provided. Patient was switched to a smaller urinary leg bag and was provided with a larger bag for overnight use. Patient and daughter both verbalized understanding. Patient was able to repeat information as well as provide return demonstration.

## 2018-12-09 NOTE — Discharge Summary (Signed)
Date of admission: 12/08/2018  Date of discharge: 12/09/2018  Admission diagnosis: Bladder tumor  Discharge diagnosis: Bladder tumor  Secondary diagnoses:  Patient Active Problem List   Diagnosis Date Noted  . Bladder tumor 12/08/2018  . Hyperglycemia 11/01/2017  . Essential hypertension 05/18/2017  . Healthcare maintenance 05/18/2017  . Hypercholesterolemia 05/18/2017  . 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 Briefly, Philip [name] is a 78 y.o. year old patient with large bladder tumor status post TURBT admitted for overnight observation.   Hospital Course: Patient tolerated the procedure well.  He was then transferred to the floor after an uneventful PACU stay.  His hospital course was uncomplicated.  On POD#1 he had met discharge criteria: was eating a regular diet, was up and ambulating independently,  pain was well controlled, and was ready to for discharge.  Catheter teaching was performed prior to discharge.  Exam: Physical Exam Constitutional:      Comments: Accompanied by daughter.  HENT:     Head: Normocephalic.  Abdominal:     General: Abdomen is flat. There is no distension.     Palpations: Abdomen is soft.  Genitourinary:    Comments: Foley catheter in place draining pink-tinged urine, no clots.  Secured to left thigh. Musculoskeletal:     Left lower leg: No edema.  Skin:    General: Skin is warm and dry.  Neurological:     General: No focal deficit present.     Mental Status: He is alert and oriented to person, place, and time.  Psychiatric:        Mood and Affect: Mood normal.     Disposition: Home  Discharge instruction: Routine Foley care  Discharge medications:  Allergies as of 12/09/2018   No Known Allergies     Medication List    STOP taking these medications   aspirin EC 81 MG tablet     TAKE these medications   atorvastatin 20 MG  tablet Commonly known as:  LIPITOR Take 20 mg by mouth daily.   CENTRUM WOMEN Tabs Take 1 tablet by mouth daily.   HYDROcodone-acetaminophen 5-325 MG tablet Commonly known as:  NORCO/VICODIN Take 1 tablet by mouth every 4 (four) hours as needed for up to 3 days for moderate pain.   lisinopril-hydrochlorothiazide 20-12.5 MG tablet Commonly known as:  PRINZIDE,ZESTORETIC Take 1 tablet by mouth daily.   sulfamethoxazole-trimethoprim 800-160 MG tablet Commonly known as:  BACTRIM DS,SEPTRA DS Take 1 tablet by mouth daily. Take daily to prevent infection while foley is in place   VITAMIN D PO Take 2,000 Units by mouth once a week.       Followup:  Follow-up Information    Billey Co, MD.   Specialty:  Urology Why:  10 days for VT and Foley removal as scheduled Contact information: [place] [address] [phone]

## 2018-12-09 NOTE — Care Management Obs Status (Signed)
[facility] NOTIFICATION   Patient Details  Name: Philip [name] MRN: [id number] Date of Birth: Nov 19, 1941   Medicare Observation Status Notification Given:  Yes    Nithila Sumners A Idil Maslanka, RN 12/09/2018, 12:41 PM

## 2018-12-11 ENCOUNTER — Telehealth: Payer: Self-pay | Admitting: Urology

## 2018-12-11 ENCOUNTER — Encounter: Payer: Self-pay | Admitting: Urology

## 2018-12-11 NOTE — Telephone Encounter (Signed)
Pt's daughter has a few post op questions:  Pt has catheter, there is a small amount of constant blood coming out of head of penis.  She want to know if this is normal.  The only pain pt is having is bladder spasms that is controlled by Tylenol.  She wants to know if this is normal and when/if it should go away.

## 2018-12-11 NOTE — Telephone Encounter (Signed)
Spoke with patient's daughter and confirmed that it is ok with having a catheter that some blood may be noted as long as it is draining well. She states that it is but patient is not having spasms controled with tylenol. She was instructed to stop by the office and pick up samples of myrbetriq 50mg  . Samples placed at the front

## 2018-12-12 LAB — SURGICAL PATHOLOGY

## 2018-12-18 ENCOUNTER — Encounter: Payer: Self-pay | Admitting: Urology

## 2018-12-18 ENCOUNTER — Ambulatory Visit (INDEPENDENT_AMBULATORY_CARE_PROVIDER_SITE_OTHER): Payer: Medicare Other | Admitting: Urology

## 2018-12-18 VITALS — BP 110/56 | HR 86 | Ht 68.0 in | Wt 188.0 lb

## 2018-12-18 DIAGNOSIS — C679 Malignant neoplasm of bladder, unspecified: Secondary | ICD-10-CM

## 2018-12-18 NOTE — Progress Notes (Signed)
Catheter Removal  Patient is present today for a catheter removal.  49ml of water was drained from the balloon. A 20FR foley cath was removed from the bladder no complications were noted . Patient tolerated well.  Preformed by: Alva Garnet

## 2018-12-18 NOTE — Anesthesia Postprocedure Evaluation (Signed)
Anesthesia Post Note  Patient: Travus Oren  Procedure(s) Performed: TRANSURETHRAL RESECTION OF BLADDER TUMOR (TURBT) (N/A )  Patient location during evaluation: PACU Anesthesia Type: General Level of consciousness: awake and alert Pain management: pain level controlled Vital Signs Assessment: post-procedure vital signs reviewed and stable Respiratory status: spontaneous breathing, nonlabored ventilation, respiratory function stable and patient connected to nasal cannula oxygen Cardiovascular status: blood pressure returned to baseline and stable Postop Assessment: no apparent nausea or vomiting Anesthetic complications: no     Last Vitals:  Vitals:   12/08/18 2246 12/09/18 0533  BP: (!) 102/48 123/60  Pulse: 80 74  Resp: 19 19  Temp: 36.6 C 36.4 C  SpO2: 95% 96%    Last Pain:  Vitals:   12/09/18 1225  TempSrc:   PainSc: 2                  Alphonsus Sias

## 2018-12-18 NOTE — Progress Notes (Signed)
   12/18/2018 10:18 AM   [name] Jun 25, 1941 [id number]  Reason for visit: Discuss TURBT pathology  I saw Mr. Jirak back in urology clinic to discuss pathology results from his recent TURBT.  To briefly summarize, he is an extremely healthy and active 78 year old male with a past surgical history notable for right inguinal hernia repair with mesh.  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  His Foley has been clear yellow and was removed in clinic today.  He overall is doing well and denies any gross hematuria, fevers, chills, or abdominal pain.  His CT urogram and chest x-ray on showed no evidence of metastatic disease, and his renal function is normal with a creatinine of 0.9.  He is here with his daughter today to discuss pathology results.  We had a very long conversation about his diagnosis of new muscle invasive bladder cancer.  We discussed options for treatment including standard of care being neoadjuvant chemotherapy followed by radical cystoprostatectomy with likely ileal conduit urinary diversion, versus tri-modal therapy with a bladder sparing approach.  With his high volume disease with satellite lesions and overall good health, I recommended pursuing a more aggressive approach with neoadjuvant chemotherapy and cystectomy.  We discussed this is typically a 5 to 10-day hospitalization with risks of bleeding, infection, injury to surrounding structures, ileus, prolonged recovery, and a readmission rate of approximately 30%.  We also discussed this is done both open and robotically, and is dependent on surgeon preference and experience, but have similar oncologic outcomes.  Finally, we discussed that prognosis is dependent on numerous factors including response to chemotherapy and most importantly final staging after cystectomy.  He would like to  pursue neoadjuvant chemotherapy here at [facility], and consult to medical oncology has been placed He will pursue cystectomy and diversion at [facility], and referral was placed  A total of 30 minutes were spent face-to-face with the patient, greater than 50% was spent in patient education, counseling, and coordination of care regarding new diagnosis of muscle invasive bladder cancer and treatment strategies.  Billey Co, [facility] [address] [place], [place] [address] [phone]

## 2018-12-18 NOTE — Patient Instructions (Signed)
Bladder Cancer  Bladder cancer is an abnormal growth of tissue in the bladder. The bladder is the balloon-like sac in the pelvis. It collects and stores urine that comes from the kidneys through the ureters. The bladder wall is made of layers. If cancer spreads into these layers and through the wall of the bladder, it becomes more difficult to treat. What are the causes? The cause of this condition is not known. What increases the risk? The following factors may make you more likely to develop this condition:  Smoking.  Workplace risks (occupational exposures), such as rubber, leather, textile, dyes, chemicals, and paint.  Being white.  Your age. Most people with bladder cancer are over the age of 55.  Being male.  Having chronic bladder inflammation.  Having a personal history of bladder cancer.  Having a family history of bladder cancer (heredity).  Having had chemotherapy or radiation therapy to the pelvis.  Having been exposed to arsenic. What are the signs or symptoms? Initial symptoms of this condition include:  Blood in the urine.  Painful urination.  Frequent bladder or urine infections.  Increase in urgency and frequency of urination. Advanced symptoms of this condition include:  Not being able to urinate.  Low back pain on one side.  Loss of appetite.  Weight loss.  Fatigue.  Swelling in the feet.  Bone pain. How is this diagnosed? This condition is diagnosed based on your medical history, a physical exam, urine tests, lab tests, imaging tests, and your symptoms. You may also have other tests or procedures done, such as:  A narrow tube being inserted into your bladder through your urethra (cystoscopy) in order to view the lining of your bladder for tumors.  A biopsy to sample the tumor to see if cancer is present. If cancer is present, it will then be staged to determine its severity and extent. Staging is an assessment of:  The size of the  tumor.  Whether the cancer has spread.  Where the cancer has spread. It is important to know how deeply into the bladder wall cancer has grown and whether cancer has spread to any other parts of your body. Staging may require blood tests or imaging tests, such as a CT scan, MRI, bone scan, or chest X-ray. How is this treated? Based on the stage of cancer, one treatment or a combination of treatments may be recommended. The most common forms of treatment are:  Surgery to remove the cancer. Procedures that may be done include transurethral resection and cystectomy.  Radiation therapy. This is high-energy X-rays or other particles. This is often used in combination with chemotherapy.  Chemotherapy. During this treatment, medicines are used to kill cancer cells.  Immunotherapy. This uses medicines to help your own immune system destroy cancer cells. Follow these instructions at home:  Take over-the-counter and prescription medicines only as told by your health care provider.  Maintain a healthy diet. Some of your treatments might affect your appetite.  Consider joining a support group. This may help you learn to cope with the stress of having bladder cancer.  Tell your cancer care team if you develop side effects. They may be able to recommend ways to relieve them.  Keep all follow-up visits as told by your health care provider. This is important. Where to find more information  American Cancer Society: www.cancer.org  National Cancer Institute (NCI): www.cancer.gov Contact a health care provider if:  You have symptoms of a urinary tract infection. These include: ?   Fever. ? Chills. ? Weakness. ? Muscle aches. ? Abdominal pain. ? Frequent and intense urge to urinate. ? Burning feeling in the bladder or urethra during urination. Get help right away if:  There is blood in your urine.  You cannot urinate.  You have severe pain or other symptoms that do not go  away. Summary  Bladder cancer is an abnormal growth of tissue in the bladder.  This condition is diagnosed based on your medical history, a physical exam, urine tests, lab tests, imaging tests, and your symptoms.  Based on the stage of cancer, surgery, chemotherapy, or a combination of treatments may be recommended.  Consider joining a support group. This may help you learn to cope with the stress of having bladder cancer. This information is not intended to replace advice given to you by your health care provider. Make sure you discuss any questions you have with your health care provider. Document Released: 11/18/2003 Document Revised: 10/19/2016 Document Reviewed: 10/19/2016 Elsevier Interactive Patient Education  2019 Elsevier Inc.  Radical Cystectomy Radical cystectomy is a surgical procedure to remove the bladder. The bladder is the organ in the lower belly that holds urine until it is passed out of the body (urination). You may have a radical cystectomy if you have bladder cancer that has spread into your bladder muscle or to more than one spot in your bladder. Bladder cancer cells can also spread to lymph nodes and other organs that are near your bladder. You may need to have lymph nodes or other organs removed during a radical cystectomy. These may include:  The prostate gland and the glands that make semen (seminal vesicles) in men.  A small part of the vagina and reproductive organs, such as the ovaries, fallopian tubes, and uterus, in women. This procedure also includes a type of reconstructive surgery that allows you to urinate without a bladder (urinary diversion). Tell a health care provider about:  Any allergies you have.  All medicines you are taking, including vitamins, herbs, eye drops, creams, and over-the-counter medicines.  Any problems you or family members have had with anesthetic medicines.  Any blood disorders you have.  Any surgeries you have had.  Any  medical conditions you have, including the possibility of pregnancy. What are the risks? Generally, this is a safe procedure. However, problems may occur, including:  Infection.  Bleeding.  Injury to other organs, especially the last part of your large intestine (rectum).  Blockage in your intestine (bowel obstruction). This can lead to complications, including infection.  Blood clots that can break off and travel to other parts of the body.  Leaking of urine or obstruction of urine flow.  Pelvic nerve damage.  Sexual problems. Men may have trouble keeping an erection (erectile dysfunction). Women may lose the ability to have an orgasm.  Cancer recurrence, if the procedure does not get rid of all of the cancer. What happens before the procedure?  Ask your health care provider if you should donate some of your own blood before surgery in case you need to receive blood (transfusion) during the procedure.  Ask your health care provider about: ? Changing or stopping your regular medicines. This is especially important if you are taking diabetes medicines or blood thinners. ? Taking medicines such as aspirin and ibuprofen. These medicines can thin your blood. Do not take these medicines before your procedure if your health care provider instructs you not to.  Follow instructions from your health care provider about eating or   drinking restrictions.  Do not drink alcohol or smoke cigarettes before your procedure as told by your health care provider.  You will need to clean out your bowels before surgery (bowel prep). Follow instructions from your health care provider about how and when to do this.  Ask your health care provider how your surgical site will be marked or identified.  You may be given antibiotic medicine to help prevent infection. What happens during the procedure?  To reduce your risk of infection: ? Your health care team will wash or sanitize their hands. ? Your skin  will be washed with soap.  An IV tube will be inserted into one of your veins.  You will be given a medicine to make you fall asleep (general anesthetic).  You may have a tube passed through your nose and into your stomach (NG tube).  You may also have a narrow tube (catheter) inserted into your bladder to drain urine during and after surgery.  Your surgeon will make an incision into your belly and down through the muscles to locate your bladder.  The tissues and organs that are attached to your bladder will be cut. These include: ? The two tubes that connect your kidneys to your bladder (ureters). ? The tube that leads out of your body from your bladder (urethra). ? Blood vessels.  Men may also have the prostate gland and seminal vesicles removed.  Women may also have the uterus, fallopian tubes, ovaries, and upper part of the vagina removed.  Lymph nodes that are near your bladder will be removed.  Your surgeon will do the type of urinary diversion that is best for you. You may have: ? An incontinent diversion. In this procedure, a short piece of intestine will be removed and connected to the ureters. This will create a tunnel directly out of your body (ileal conduit). Then, one end of the conduit will be pulled through your belly wall and attached to the outside of your skin. This will make an opening (stoma) to drain urine. ? A continent diversion. In this procedure, a pouch to collect urine will be made from part of your intestine. Your ureters will be connected to one end of the pouch. There will be a valve in the other end. This end will be pulled through your belly wall and attached to the outside of your skin. You will drain urine several times a day using a tube (catheter) that is passed through the stoma. ? A neobladder. This procedure will make a new bladder from part of your intestine. Your ureters will be attached to the new bladder. The neobladder will be sewn to your  urethra. This will allow urine to flow through your urethra like it did before surgery.  After making the urinary diversion, your health care provider will close your belly incision with stitches (sutures) or staples.  A bandage (dressing) will be placed over your incision.  If you have a stoma, a bag may be placed over it to collect urine (urostomy bag).  If you have a neobladder, a catheter will be placed through your urethra to drain urine from your new bladder.  You may have a small plastic tube (surgical drain) placed so that fluid can drain away from the surgery sitewhile it heals. The procedure may vary among health care providers and hospitals. What happens after the procedure?  Your blood pressure, heart rate, breathing rate and blood oxygen level will be monitored often until the medicines you were   given have worn off.  You will be given pain medicine as needed. You may also get antibiotics and fluids through your IV tube.  You will be urged to get up and walk around as soon as you can.  Your IV tube and NG tube can be taken out after you can eat and drink on your own.  If you have a neobladder, you may be sent home with a catheter. Your health care provider will give you instructions for caring for your catheter at home.  You will be shown how to care for the type of urinary diversion that you have. Make sure that you understand these instructions. Follow them carefully.  You may be sent home with a surgical drain. Your health care provider will give you instructions for caring for your drain at home. This information is not intended to replace advice given to you by your health care provider. Make sure you discuss any questions you have with your health care provider. Document Released: 04/01/2015 Document Revised: 04/22/2016 Document Reviewed: 01/20/2015 Elsevier Interactive Patient Education  2019 Elsevier Inc.   

## 2018-12-19 ENCOUNTER — Telehealth: Payer: Self-pay | Admitting: Radiology

## 2018-12-19 ENCOUNTER — Ambulatory Visit: Payer: Self-pay | Admitting: Urology

## 2018-12-19 NOTE — Telephone Encounter (Signed)
Patient reports swelling of the glans after foley catheter removal yesterday. Discussed this with Fonnie Jarvis who removed the catheter who states swelling was present at his appointment. Relayed Sarah's advise to patient that it is likely caused by irritation from the catheter & will go down with time. Patient denies difficulty urinating. Advised patient to call back if swelling doesn't improve or if urination becomes difficult. Questions answered. Patient expresses understanding of conversation.

## 2018-12-21 ENCOUNTER — Ambulatory Visit: Payer: Medicare Other | Admitting: Oncology

## 2018-12-24 NOTE — Progress Notes (Signed)
[place]  [phone] [phone]) [phone]  ID: Philip [name] OB: [birth year]  MR#: [id number]  VZC#:[id number]  Patient Care Team: Kirk Ruths, MD as PCP - General (Internal Medicine)  CHIEF COMPLAINT: Stage II invasive bladder cancer.  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  He is referred to clinic for consideration of neoadjuvant chemotherapy which will then be followed with cystectomy at [facility].  Currently, he feels well and is asymptomatic.  He has no neurologic complaints.  He denies any recent fevers or illnesses.  He has a good appetite and denies weight loss.  He has no chest pain or shortness of breath.  He denies any nausea, vomiting, constipation, or diarrhea.  He has no urinary complaints.  Patient feels that his baseline offers no specific complaints today.  REVIEW OF SYSTEMS:   Review of Systems  Constitutional: Negative.  Negative for fever, malaise/fatigue and weight loss.  Respiratory: Negative.  Negative for cough, hemoptysis and shortness of breath.   Cardiovascular: Negative.  Negative for chest pain and leg swelling.  Gastrointestinal: Negative.  Negative for abdominal pain.  Genitourinary: Negative.  Negative for dysuria and hematuria.  Musculoskeletal: Negative.   Skin: Negative.  Negative for rash.  Neurological: Negative.  Negative for focal weakness, weakness and headaches.  Psychiatric/Behavioral: Negative.  The patient is not nervous/anxious.     As per HPI. Otherwise, a complete review of systems is negative.  PAST MEDICAL HISTORY: Past Medical History:  Diagnosis Date  . Hypertension     PAST SURGICAL HISTORY: Past Surgical History:  Procedure Laterality Date  . BACK SURGERY    . CATARACT EXTRACTION    . HERNIA REPAIR    . TONSILLECTOMY    . TRANSURETHRAL RESECTION OF BLADDER TUMOR N/A 12/08/2018   Procedure: TRANSURETHRAL RESECTION OF  BLADDER TUMOR (TURBT);  Surgeon: Billey Co, MD;  Location: [facility] ORS;  Service: Urology;  Laterality: N/A;    FAMILY HISTORY: No family history on file.  ADVANCED DIRECTIVES (Y/N):  N  HEALTH MAINTENANCE: Social History   Tobacco Use  . Smoking status: Former Smoker    Years: 35.00    Types: Cigarettes    Last attempt to quit: 11/29/1993    Years since quitting: 25.0  . Smokeless tobacco: Never Used  Substance Use Topics  . Alcohol use: Yes    Comment: a couple of drinks a day all the above  . Drug use: Never     Colonoscopy:  PAP:  Bone density:  Lipid panel:  No Known Allergies  Current Outpatient Medications  Medication Sig Dispense Refill  . aspirin EC 81 MG tablet Take by mouth.    Marland Kitchen atorvastatin (LIPITOR) 20 MG tablet Take 20 mg by mouth daily.    Marland Kitchen lisinopril-hydrochlorothiazide (PRINZIDE,ZESTORETIC) 20-12.5 MG tablet Take 1 tablet by mouth daily.     . Multiple Vitamins-Minerals (CENTRUM WOMEN) TABS Take 1 tablet by mouth daily.    Marland Kitchen VITAMIN D PO Take 2,000 Units by mouth once a week.     . Vitamin D, Ergocalciferol, (DRISDOL) 1.25 MG (50000 UT) CAPS capsule Take by mouth.     No current facility-administered medications for this visit.     OBJECTIVE: Vitals:   12/25/18 1456  BP: (!) 155/73  Pulse: 70  Temp: (!) 97.1 F (36.2 C)     Body mass index is 28.94 kg/m.    ECOG FS:0 - Asymptomatic  General: Well-developed, well-nourished, no  acute distress. Eyes: Pink conjunctiva, anicteric sclera. HEENT: Normocephalic, moist mucous membranes, clear oropharnyx. Lungs: Clear to auscultation bilaterally. Heart: Regular rate and rhythm. No rubs, murmurs, or gallops. Abdomen: Soft, nontender, nondistended. No organomegaly noted, normoactive bowel sounds. Musculoskeletal: No edema, cyanosis, or clubbing. Neuro: Alert, answering all questions appropriately. Cranial nerves grossly intact. Skin: No rashes or petechiae noted. Psych: Normal  affect. Lymphatics: No cervical, calvicular, axillary or inguinal LAD.   LAB RESULTS:  Lab Results  Component Value Date   CREATININE 0.90 11/23/2018    No results found for: WBC, NEUTROABS, HGB, HCT, MCV, PLT   STUDIES: Chest 1 View  Result Date: 12/01/2018 CLINICAL DATA:  Bladder mass. EXAM: CHEST  1 VIEW COMPARISON:  None. FINDINGS: The heart size and mediastinal contours are within normal limits. Both lungs are clear. The visualized skeletal structures are unremarkable. IMPRESSION: No active disease. Electronically Signed   By: Marijo Conception, M.D.   On: 12/01/2018 15:45    ASSESSMENT: Stage II invasive bladder cancer  PLAN:   1.  Stage II invasive bladder cancer: Pathology and imaging results reviewed independently.  We will get a PET scan to complete the staging work-up.  Patient will also require port placement prior to initiating treatment.  Patient is agreed to neoadjuvant chemotherapy using cisplatin and gemcitabine for approximately 3 months followed by cystectomy which by report is going to be completed at [facility].  He received cisplatin and gemcitabine on day 1 followed by gemcitabine only on day 8 and day 15.  This will be a 28-day cycle.  Patient will return to clinic on January 11, 2021 initiate cycle 1, day 1 of cisplatin and gemcitabine.  I spent a total of 60 minutes face-to-face with the patient of which greater than 50% of the visit was spent in counseling and coordination of care as detailed above.   Patient expressed understanding and was in agreement with this plan. He also understands that He can call clinic at any time with any questions, concerns, or complaints.   Cancer Staging Bladder cancer [facility]) Staging form: Urinary Bladder, AJCC 8th Edition - Clinical stage from 12/25/2018: Stage II (cT2, cN0, cM0) - Signed by Lloyd Huger, MD on 12/25/2018   Lloyd Huger, MD   12/26/2018 12:46 PM

## 2018-12-25 ENCOUNTER — Other Ambulatory Visit: Payer: Self-pay

## 2018-12-25 ENCOUNTER — Inpatient Hospital Stay: Payer: Medicare Other | Attending: Oncology | Admitting: Oncology

## 2018-12-25 VITALS — BP 155/73 | HR 70 | Temp 97.1°F | Ht 68.0 in | Wt 190.3 lb

## 2018-12-25 DIAGNOSIS — I10 Essential (primary) hypertension: Secondary | ICD-10-CM | POA: Insufficient documentation

## 2018-12-25 DIAGNOSIS — C679 Malignant neoplasm of bladder, unspecified: Secondary | ICD-10-CM | POA: Diagnosis present

## 2018-12-25 DIAGNOSIS — Z79899 Other long term (current) drug therapy: Secondary | ICD-10-CM | POA: Diagnosis not present

## 2018-12-25 DIAGNOSIS — C678 Malignant neoplasm of overlapping sites of bladder: Secondary | ICD-10-CM

## 2018-12-25 DIAGNOSIS — Z7982 Long term (current) use of aspirin: Secondary | ICD-10-CM | POA: Diagnosis not present

## 2018-12-25 DIAGNOSIS — Z87891 Personal history of nicotine dependence: Secondary | ICD-10-CM

## 2018-12-25 NOTE — Progress Notes (Signed)
Patient is here today to establish care. Patient stated that he started to notice blood in his urine. He then had a CT Scan and saw that he had a tumor in his bladder. Patient saw the urologist and had part of the tumor removed and now he was told that he would need treatment to treat it. Patient declined any pain at this time.

## 2018-12-25 NOTE — Progress Notes (Signed)
START ON PATHWAY REGIMEN - Bladder     A cycle is every 21 days:     Gemcitabine      Cisplatin   **Always confirm dose/schedule in your pharmacy ordering system**  Patient Characteristics: Pre Cystectomy, Clinical T2-T4a, N0-1, M0, Cystectomy Eligible, Cisplatin-Based Chemotherapy Indicated (CrCl ? 50 mL/min and Minimal or No Symptoms) AJCC M Category: M0 AJCC N Category: N0 AJCC T Category: T2 Current evidence of distant metastases<= No AJCC 8 Stage Grouping: II Intent of Therapy: Curative Intent, Discussed with Patient 

## 2018-12-26 ENCOUNTER — Telehealth (INDEPENDENT_AMBULATORY_CARE_PROVIDER_SITE_OTHER): Payer: Self-pay

## 2018-12-26 ENCOUNTER — Encounter (INDEPENDENT_AMBULATORY_CARE_PROVIDER_SITE_OTHER): Payer: Self-pay

## 2018-12-26 MED ORDER — ONDANSETRON HCL 8 MG PO TABS: 8.0000 mg | ORAL_TABLET | Freq: Two times a day (BID) | ORAL | 2 refills | Status: DC | PRN

## 2018-12-26 MED ORDER — PROCHLORPERAZINE MALEATE 10 MG PO TABS: 10.0000 mg | ORAL_TABLET | Freq: Four times a day (QID) | ORAL | 2 refills | Status: DC | PRN

## 2018-12-26 MED ORDER — LIDOCAINE-PRILOCAINE 2.5-2.5 % EX CREA: TOPICAL_CREAM | CUTANEOUS | 3 refills | Status: DC

## 2018-12-26 NOTE — Telephone Encounter (Signed)
Spoke with the patient and gave him his pre-procedure instructions as well as the day and time of his procedure with Dr. Lucky Cowboy. Patient is scheduled for 01/01/2019 with a 9:30 am arrival time. This information will be mailed out to the patient.

## 2018-12-29 ENCOUNTER — Other Ambulatory Visit (INDEPENDENT_AMBULATORY_CARE_PROVIDER_SITE_OTHER): Payer: Self-pay | Admitting: Nurse Practitioner

## 2018-12-31 MED ORDER — DEXTROSE 5 % IV SOLN
2.0000 g | Freq: Once | INTRAVENOUS | Status: AC
  Administered 2019-01-01: 2 g via INTRAVENOUS
  Filled 2018-12-31: qty 20

## 2019-01-01 ENCOUNTER — Other Ambulatory Visit: Payer: Self-pay

## 2019-01-01 ENCOUNTER — Encounter
Admission: RE | Disposition: A | Discharge status: Home / Self Care | Payer: Self-pay | Source: Home / Self Care | Attending: Vascular Surgery

## 2019-01-01 ENCOUNTER — Ambulatory Visit
Admission: RE | Admit: 2019-01-01 | Discharge: 2019-01-01 | Disposition: A | Discharge status: Home / Self Care | Payer: Medicare Other | Attending: Vascular Surgery | Admitting: Vascular Surgery

## 2019-01-01 DIAGNOSIS — C679 Malignant neoplasm of bladder, unspecified: Secondary | ICD-10-CM | POA: Insufficient documentation

## 2019-01-01 HISTORY — PX: PORTA CATH INSERTION: CATH118285

## 2019-01-01 SURGERY — PORTA CATH INSERTION
Anesthesia: Moderate Sedation

## 2019-01-01 MED ORDER — SODIUM CHLORIDE 0.9 % IV SOLN
INTRAVENOUS | Status: DC
  Administered 2019-01-01: 10:00:00 via INTRAVENOUS

## 2019-01-01 MED ORDER — LIDOCAINE-EPINEPHRINE (PF) 1 %-1:200000 IJ SOLN
INTRAMUSCULAR | Status: AC
  Filled 2019-01-01: qty 30

## 2019-01-01 MED ORDER — MIDAZOLAM HCL 5 MG/5ML IJ SOLN
INTRAMUSCULAR | Status: AC
  Filled 2019-01-01: qty 5

## 2019-01-01 MED ORDER — FENTANYL CITRATE (PF) 100 MCG/2ML IJ SOLN
INTRAMUSCULAR | Status: AC
  Filled 2019-01-01: qty 2

## 2019-01-01 MED ORDER — FENTANYL CITRATE (PF) 100 MCG/2ML IJ SOLN
INTRAMUSCULAR | Status: DC | PRN
  Administered 2019-01-01: 50 ug via INTRAVENOUS

## 2019-01-01 MED ORDER — FAMOTIDINE 20 MG PO TABS: 40.0000 mg | ORAL_TABLET | Freq: Once | ORAL | Status: DC | PRN

## 2019-01-01 MED ORDER — ONDANSETRON HCL 4 MG/2ML IJ SOLN: 4.0000 mg | Freq: Four times a day (QID) | INTRAMUSCULAR | Status: DC | PRN

## 2019-01-01 MED ORDER — MIDAZOLAM HCL 2 MG/ML PO SYRP: 8.0000 mg | ORAL_SOLUTION | Freq: Once | ORAL | Status: DC | PRN

## 2019-01-01 MED ORDER — HYDROMORPHONE HCL 1 MG/ML IJ SOLN: 1.0000 mg | Freq: Once | INTRAMUSCULAR | Status: DC | PRN

## 2019-01-01 MED ORDER — HEPARIN (PORCINE) IN NACL 1000-0.9 UT/500ML-% IV SOLN
INTRAVENOUS | Status: AC
  Filled 2019-01-01: qty 500

## 2019-01-01 MED ORDER — DIPHENHYDRAMINE HCL 50 MG/ML IJ SOLN: 50.0000 mg | Freq: Once | INTRAMUSCULAR | Status: DC | PRN

## 2019-01-01 MED ORDER — METHYLPREDNISOLONE SODIUM SUCC 125 MG IJ SOLR: 125.0000 mg | Freq: Once | INTRAMUSCULAR | Status: DC | PRN

## 2019-01-01 MED ORDER — SODIUM CHLORIDE 0.9 % IV SOLN
Freq: Once | INTRAVENOUS | Status: AC
  Administered 2019-01-01: 13:00:00
  Filled 2019-01-01: qty 80

## 2019-01-01 MED ORDER — MIDAZOLAM HCL 2 MG/2ML IJ SOLN
INTRAMUSCULAR | Status: DC | PRN
  Administered 2019-01-01: 2 mg via INTRAVENOUS

## 2019-01-01 SURGICAL SUPPLY — 8 items
KIT PORT POWER 8FR ISP CVUE (Port) ×3 IMPLANT
PACK ANGIOGRAPHY (CUSTOM PROCEDURE TRAY) ×3 IMPLANT
PAD GROUND ADULT SPLIT (MISCELLANEOUS) ×3 IMPLANT
PENCIL ELECTRO HAND CTR (MISCELLANEOUS) ×3 IMPLANT
SUT MNCRL AB 4-0 PS2 18 (SUTURE) ×3 IMPLANT
SUT PROLENE 0 CT 1 30 (SUTURE) ×3 IMPLANT
SUT VIC AB 3-0 SH 27 (SUTURE) ×2
SUT VIC AB 3-0 SH 27X BRD (SUTURE) ×1 IMPLANT

## 2019-01-01 NOTE — H&P (Signed)
 [facility] History & Physical Update  The patient was interviewed and re-examined.  The patient's previous History and Physical has been reviewed and is unchanged.  There is no change in the plan of care. We plan to proceed with the scheduled procedure.  Leotis Pain, MD  01/01/2019, 12:06 PM

## 2019-01-01 NOTE — Op Note (Signed)
      [facility]       Operative Note  Date: 01/01/2019  Preoperative diagnosis:  1. Bladder cancer  Postoperative diagnosis:  Same as above  Procedures: #1. Ultrasound guidance for vascular access to the right internal jugular vein. #2. Fluoroscopic guidance for placement of catheter. #3. Placement of CT compatible Port-A-Cath, right internal jugular vein.  Surgeon: Leotis Pain, MD.   Anesthesia: Local with moderate conscious sedation for approximately 15  minutes using 2 mg of Versed and 50 mcg of Fentanyl  Fluoroscopy time: less than 1 minute  Contrast used: 0  Estimated blood loss: 5 cc  Indication for the procedure:  The patient is a 78 y.o.male with bladder cancer.  The patient needs a Port-A-Cath for durable venous access, chemotherapy, lab draws, and CT scans. We are asked to place this. Risks and benefits were discussed and informed consent was obtained.  Description of procedure: The patient was brought to the vascular and interventional radiology suite.  Moderate conscious sedation was administered throughout the procedure during a face to face encounter with the patient with my supervision of the RN administering medicines and monitoring the patient's vital signs, pulse oximetry, telemetry and mental status throughout from the start of the procedure until the patient was taken to the recovery room. The right neck chest and shoulder were sterilely prepped and draped, and a sterile surgical field was created. Ultrasound was used to help visualize a patent right internal jugular vein. This was then accessed under direct ultrasound guidance without difficulty with the Seldinger needle and a permanent image was recorded. A J-wire was placed. After skin nick and dilatation, the peel-away sheath was then placed over the wire. I then anesthetized an area under the clavicle approximately 1-2 fingerbreadths. A transverse incision was created and an inferior pocket was  created with electrocautery and blunt dissection. The port was then brought onto the field, placed into the pocket and secured to the chest wall with 2 Prolene sutures. The catheter was connected to the port and tunneled from the subclavicular incision to the access site. Fluoroscopic guidance was then used to cut the catheter to an appropriate length. The catheter was then placed through the peel-away sheath and the peel-away sheath was removed. The catheter tip was parked in excellent location under fluorocoscopic guidance in the SVC just above the right atrium. The pocket was then irrigated with antibiotic impregnated saline and the wound was closed with a running 3-0 Vicryl and a 4-0 Monocryl. The access incision was closed with a single 4-0 Monocryl. The Huber needle was used to withdraw blood and flush the port with heparinized saline. Dermabond was then placed as a dressing. The patient tolerated the procedure well and was taken to the recovery room in stable condition.   Leotis Pain 01/01/2019 1:01 PM   This note was created with Dragon Medical transcription system. Any errors in dictation are purely unintentional.

## 2019-01-02 ENCOUNTER — Ambulatory Visit
Admission: RE | Admit: 2019-01-02 | Discharge: 2019-01-02 | Disposition: A | Discharge status: Home / Self Care | Payer: Medicare Other | Source: Ambulatory Visit | Attending: Oncology | Admitting: Oncology

## 2019-01-02 DIAGNOSIS — C678 Malignant neoplasm of overlapping sites of bladder: Secondary | ICD-10-CM | POA: Insufficient documentation

## 2019-01-02 LAB — GLUCOSE, CAPILLARY: Glucose-Capillary: 101 mg/dL — ABNORMAL HIGH (ref 70–99)

## 2019-01-02 MED ORDER — FLUDEOXYGLUCOSE F - 18 (FDG) INJECTION
9.8000 | Freq: Once | INTRAVENOUS | Status: AC | PRN
  Administered 2019-01-02: 10.87 via INTRAVENOUS

## 2019-01-03 NOTE — Patient Instructions (Signed)
Cisplatin injection  What is this medicine?  CISPLATIN (SIS pla tin) is a chemotherapy drug. It targets fast dividing cells, like cancer cells, and causes these cells to die. This medicine is used to treat many types of cancer like bladder, ovarian, and testicular cancers.  This medicine may be used for other purposes; ask your health care provider or pharmacist if you have questions.  COMMON BRAND NAME(S): Platinol, Platinol -AQ  What should I tell my health care provider before I take this medicine?  They need to know if you have any of these conditions:  -blood disorders  -hearing problems  -kidney disease  -recent or ongoing radiation therapy  -an unusual or allergic reaction to cisplatin, carboplatin, other chemotherapy, other medicines, foods, dyes, or preservatives  -pregnant or trying to get pregnant  -breast-feeding  How should I use this medicine?  This drug is given as an infusion into a vein. It is administered in a hospital or clinic by a specially trained health care professional.  Talk to your pediatrician regarding the use of this medicine in children. Special care may be needed.  Overdosage: If you think you have taken too much of this medicine contact a poison control center or emergency room at once.  NOTE: This medicine is only for you. Do not share this medicine with others.  What if I miss a dose?  It is important not to miss a dose. Call your doctor or health care professional if you are unable to keep an appointment.  What may interact with this medicine?  -dofetilide  -foscarnet  -medicines for seizures  -medicines to increase blood counts like filgrastim, pegfilgrastim, sargramostim  -probenecid  -pyridoxine used with altretamine  -rituximab  -some antibiotics like amikacin, gentamicin, neomycin, polymyxin B, streptomycin, tobramycin  -sulfinpyrazone  -vaccines  -zalcitabine  Talk to your doctor or health care professional before taking any of these  medicines:  -acetaminophen  -aspirin  -ibuprofen  -ketoprofen  -naproxen  This list may not describe all possible interactions. Give your health care provider a list of all the medicines, herbs, non-prescription drugs, or dietary supplements you use. Also tell them if you smoke, drink alcohol, or use illegal drugs. Some items may interact with your medicine.  What should I watch for while using this medicine?  Your condition will be monitored carefully while you are receiving this medicine. You will need important blood work done while you are taking this medicine.  This drug may make you feel generally unwell. This is not uncommon, as chemotherapy can affect healthy cells as well as cancer cells. Report any side effects. Continue your course of treatment even though you feel ill unless your doctor tells you to stop.  In some cases, you may be given additional medicines to help with side effects. Follow all directions for their use.  Call your doctor or health care professional for advice if you get a fever, chills or sore throat, or other symptoms of a cold or flu. Do not treat yourself. This drug decreases your body's ability to fight infections. Try to avoid being around people who are sick.  This medicine may increase your risk to bruise or bleed. Call your doctor or health care professional if you notice any unusual bleeding.  Be careful brushing and flossing your teeth or using a toothpick because you may get an infection or bleed more easily. If you have any dental work done, tell your dentist you are receiving this medicine.  Avoid taking products   that contain aspirin, acetaminophen, ibuprofen, naproxen, or ketoprofen unless instructed by your doctor. These medicines may hide a fever.  Do not become pregnant while taking this medicine. Women should inform their doctor if they wish to become pregnant or think they might be pregnant. There is a potential for serious side effects to an unborn child. Talk to  your health care professional or pharmacist for more information. Do not breast-feed an infant while taking this medicine.  Drink fluids as directed while you are taking this medicine. This will help protect your kidneys.  Call your doctor or health care professional if you get diarrhea. Do not treat yourself.  What side effects may I notice from receiving this medicine?  Side effects that you should report to your doctor or health care professional as soon as possible:  -allergic reactions like skin rash, itching or hives, swelling of the face, lips, or tongue  -signs of infection - fever or chills, cough, sore throat, pain or difficulty passing urine  -signs of decreased platelets or bleeding - bruising, pinpoint red spots on the skin, black, tarry stools, nosebleeds  -signs of decreased red blood cells - unusually weak or tired, fainting spells, lightheadedness  -breathing problems  -changes in hearing  -gout pain  -low blood counts - This drug may decrease the number of white blood cells, red blood cells and platelets. You may be at increased risk for infections and bleeding.  -nausea and vomiting  -pain, swelling, redness or irritation at the injection site  -pain, tingling, numbness in the hands or feet  -problems with balance, movement  -trouble passing urine or change in the amount of urine  Side effects that usually do not require medical attention (report to your doctor or health care professional if they continue or are bothersome):  -changes in vision  -loss of appetite  -metallic taste in the mouth or changes in taste  This list may not describe all possible side effects. Call your doctor for medical advice about side effects. You may report side effects to FDA at 1-800-FDA-1088.  Where should I keep my medicine?  This drug is given in a hospital or clinic and will not be stored at home.  NOTE: This sheet is a summary. It may not cover all possible information. If you have questions about this medicine,  talk to your doctor, pharmacist, or health care provider.   2019 Elsevier/Gold Standard (2008-02-20 14:40:54)    Gemcitabine injection  What is this medicine?  GEMCITABINE (jem SYE ta been) is a chemotherapy drug. This medicine is used to treat many types of cancer like breast cancer, lung cancer, pancreatic cancer, and ovarian cancer.  This medicine may be used for other purposes; ask your health care provider or pharmacist if you have questions.  COMMON BRAND NAME(S): Gemzar, Infugem  What should I tell my health care provider before I take this medicine?  They need to know if you have any of these conditions:  -blood disorders  -infection  -kidney disease  -liver disease  -lung or breathing disease, like asthma  -recent or ongoing radiation therapy  -an unusual or allergic reaction to gemcitabine, other chemotherapy, other medicines, foods, dyes, or preservatives  -pregnant or trying to get pregnant  -breast-feeding  How should I use this medicine?  This drug is given as an infusion into a vein. It is administered in a hospital or clinic by a specially trained health care professional.  Talk to your pediatrician regarding the use of   this medicine in children. Special care may be needed.  Overdosage: If you think you have taken too much of this medicine contact a poison control center or emergency room at once.  NOTE: This medicine is only for you. Do not share this medicine with others.  What if I miss a dose?  It is important not to miss your dose. Call your doctor or health care professional if you are unable to keep an appointment.  What may interact with this medicine?  -medicines to increase blood counts like filgrastim, pegfilgrastim, sargramostim  -some other chemotherapy drugs like cisplatin  -vaccines  Talk to your doctor or health care professional before taking any of these medicines:  -acetaminophen  -aspirin  -ibuprofen  -ketoprofen  -naproxen  This list may not describe all possible interactions.  Give your health care provider a list of all the medicines, herbs, non-prescription drugs, or dietary supplements you use. Also tell them if you smoke, drink alcohol, or use illegal drugs. Some items may interact with your medicine.  What should I watch for while using this medicine?  Visit your doctor for checks on your progress. This drug may make you feel generally unwell. This is not uncommon, as chemotherapy can affect healthy cells as well as cancer cells. Report any side effects. Continue your course of treatment even though you feel ill unless your doctor tells you to stop.  In some cases, you may be given additional medicines to help with side effects. Follow all directions for their use.  Call your doctor or health care professional for advice if you get a fever, chills or sore throat, or other symptoms of a cold or flu. Do not treat yourself. This drug decreases your body's ability to fight infections. Try to avoid being around people who are sick.  This medicine may increase your risk to bruise or bleed. Call your doctor or health care professional if you notice any unusual bleeding.  Be careful brushing and flossing your teeth or using a toothpick because you may get an infection or bleed more easily. If you have any dental work done, tell your dentist you are receiving this medicine.  Avoid taking products that contain aspirin, acetaminophen, ibuprofen, naproxen, or ketoprofen unless instructed by your doctor. These medicines may hide a fever.  Do not become pregnant while taking this medicine or for 6 months after stopping it. Women should inform their doctor if they wish to become pregnant or think they might be pregnant. Men should not father a child while taking this medicine and for 3 months after stopping it. There is a potential for serious side effects to an unborn child. Talk to your health care professional or pharmacist for more information. Do not breast-feed an infant while taking this  medicine or for at least 1 week after stopping it.  Men should inform their doctors if they wish to father a child. This medicine may lower sperm counts. Talk with your doctor or health care professional if you are concerned about your fertility.  What side effects may I notice from receiving this medicine?  Side effects that you should report to your doctor or health care professional as soon as possible:  -allergic reactions like skin rash, itching or hives, swelling of the face, lips, or tongue  -breathing problems  -pain, redness, or irritation at site where injected  -signs and symptoms of a dangerous change in heartbeat or heart rhythm like chest pain; dizziness; fast or irregular heartbeat; palpitations; feeling   faint or lightheaded, falls; breathing problems  -signs of decreased platelets or bleeding - bruising, pinpoint red spots on the skin, black, tarry stools, blood in the urine  -signs of decreased red blood cells - unusually weak or tired, feeling faint or lightheaded, falls  -signs of infection - fever or chills, cough, sore throat, pain or difficulty passing urine  -signs and symptoms of kidney injury like trouble passing urine or change in the amount of urine  -signs and symptoms of liver injury like dark yellow or brown urine; general ill feeling or flu-like symptoms; light-colored stools; loss of appetite; nausea; right upper belly pain; unusually weak or tired; yellowing of the eyes or skin  -swelling of ankles, feet, hands  Side effects that usually do not require medical attention (report to your doctor or health care professional if they continue or are bothersome):  -constipation  -diarrhea  -hair loss  -loss of appetite  -nausea  -rash  -vomiting  This list may not describe all possible side effects. Call your doctor for medical advice about side effects. You may report side effects to FDA at 1-800-FDA-1088.  Where should I keep my medicine?  This drug is given in a hospital or clinic and  will not be stored at home.  NOTE: This sheet is a summary. It may not cover all possible information. If you have questions about this medicine, talk to your doctor, pharmacist, or health care provider.   2019 Elsevier/Gold Standard (2018-02-08 18:06:11)

## 2019-01-04 ENCOUNTER — Inpatient Hospital Stay: Payer: Medicare Other | Attending: Oncology

## 2019-01-04 DIAGNOSIS — Z7982 Long term (current) use of aspirin: Secondary | ICD-10-CM | POA: Insufficient documentation

## 2019-01-04 DIAGNOSIS — Z87891 Personal history of nicotine dependence: Secondary | ICD-10-CM | POA: Insufficient documentation

## 2019-01-04 DIAGNOSIS — N400 Enlarged prostate without lower urinary tract symptoms: Secondary | ICD-10-CM | POA: Insufficient documentation

## 2019-01-04 DIAGNOSIS — D72819 Decreased white blood cell count, unspecified: Secondary | ICD-10-CM | POA: Insufficient documentation

## 2019-01-04 DIAGNOSIS — I10 Essential (primary) hypertension: Secondary | ICD-10-CM | POA: Insufficient documentation

## 2019-01-04 DIAGNOSIS — Z79899 Other long term (current) drug therapy: Secondary | ICD-10-CM | POA: Insufficient documentation

## 2019-01-04 DIAGNOSIS — Z5111 Encounter for antineoplastic chemotherapy: Secondary | ICD-10-CM | POA: Insufficient documentation

## 2019-01-04 DIAGNOSIS — E871 Hypo-osmolality and hyponatremia: Secondary | ICD-10-CM | POA: Insufficient documentation

## 2019-01-04 DIAGNOSIS — C679 Malignant neoplasm of bladder, unspecified: Secondary | ICD-10-CM | POA: Insufficient documentation

## 2019-01-04 DIAGNOSIS — D696 Thrombocytopenia, unspecified: Secondary | ICD-10-CM | POA: Insufficient documentation

## 2019-01-05 NOTE — Progress Notes (Signed)
[name]  [phone] Fax:(336) [phone]  ID: [name] OB: [birth year]  MR#: [id number]  TIW#:[id number]  Patient Care Team: Kirk Ruths, MD as PCP - General (Internal Medicine)  CHIEF COMPLAINT: Stage II invasive bladder cancer.  INTERVAL HISTORY: Patient returns to clinic today for further evaluation and initiation of cycle 1, day 1 of neoadjuvant cisplatin plus gemcitabine.  He continues to feel well and remains asymptomatic. He has no neurologic complaints.  He denies any recent fevers or illnesses.  He has a good appetite and denies weight loss.  He has no chest pain or shortness of breath.  He denies any nausea, vomiting, constipation, or diarrhea.  He has no urinary complaints.  Patient feels that his baseline offers no specific complaints today.  REVIEW OF SYSTEMS:   Review of Systems  Constitutional: Negative.  Negative for fever, malaise/fatigue and weight loss.  Respiratory: Negative.  Negative for cough, hemoptysis and shortness of breath.   Cardiovascular: Negative.  Negative for chest pain and leg swelling.  Gastrointestinal: Negative.  Negative for abdominal pain.  Genitourinary: Negative.  Negative for dysuria and hematuria.  Musculoskeletal: Negative.   Skin: Negative.  Negative for rash.  Neurological: Negative.  Negative for focal weakness, weakness and headaches.  Psychiatric/Behavioral: Negative.  The patient is not nervous/anxious.     As per HPI. Otherwise, a complete review of systems is negative.  PAST MEDICAL HISTORY: Past Medical History:  Diagnosis Date  . Hypertension     PAST SURGICAL HISTORY: Past Surgical History:  Procedure Laterality Date  . BACK SURGERY    . CATARACT EXTRACTION    . HERNIA REPAIR    . PORTA CATH INSERTION N/A 01/01/2019   Procedure: PORTA CATH INSERTION;  Surgeon: Algernon Huxley, MD;  Location: [facility];  Service: Cardiovascular;  Laterality: N/A;  . TONSILLECTOMY    .  TRANSURETHRAL RESECTION OF BLADDER TUMOR N/A 12/08/2018   Procedure: TRANSURETHRAL RESECTION OF BLADDER TUMOR (TURBT);  Surgeon: Billey Co, MD;  Location: [facility];  Service: Urology;  Laterality: N/A;    FAMILY HISTORY: No family history on file.  ADVANCED DIRECTIVES (Y/N):  N  HEALTH MAINTENANCE: Social History   Tobacco Use  . Smoking status: Former Smoker    Years: 35.00    Types: Cigarettes    Last attempt to quit: 11/29/1993    Years since quitting: 25.1  . Smokeless tobacco: Never Used  Substance Use Topics  . Alcohol use: Yes    Comment: a couple of drinks a day all the above  . Drug use: Never     Colonoscopy:  PAP:  Bone density:  Lipid panel:  No Known Allergies  Current Outpatient Medications  Medication Sig Dispense Refill  . aspirin EC 81 MG tablet Take by mouth.    Marland Kitchen atorvastatin (LIPITOR) 20 MG tablet Take 20 mg by mouth daily.    Marland Kitchen lidocaine-prilocaine (EMLA) cream Apply to affected area once    . lisinopril-hydrochlorothiazide (PRINZIDE,ZESTORETIC) 20-12.5 MG tablet Take 1 tablet by mouth daily.     . Multiple Vitamins-Minerals (CENTRUM WOMEN) TABS Take 1 tablet by mouth daily.    . ondansetron (ZOFRAN) 8 MG tablet Take 1 tablet (8 mg total) by mouth 2 (two) times daily as needed. Start on the third day after cisplatin chemotherapy. 30 tablet 2  . prochlorperazine (COMPAZINE) 10 MG tablet Take 1 tablet (10 mg total) by mouth every 6 (six) hours as needed (Nausea or vomiting). 60 tablet 2  .  sodium fluoride (DENTAGEL) 1.1 % GEL dental gel 1 ampule.    Marland Kitchen VITAMIN D PO Take 2,000 Units by mouth once a week.     . Vitamin D, Ergocalciferol, (DRISDOL) 1.25 MG (50000 UT) CAPS capsule Take by mouth.     No current facility-administered medications for this visit.     OBJECTIVE: Vitals:   01/11/19 0928  BP: (!) 155/69  Pulse: 80  Temp: (!) 97.3 F (36.3 C)     Body mass index is 28.94 kg/m.    ECOG FS:0 - Asymptomatic  General: Well-developed,  well-nourished, no acute distress. Eyes: Pink conjunctiva, anicteric sclera. HEENT: Normocephalic, moist mucous membranes. Lungs: Clear to auscultation bilaterally. Heart: Regular rate and rhythm. No rubs, murmurs, or gallops. Abdomen: Soft, nontender, nondistended. No organomegaly noted, normoactive bowel sounds. Musculoskeletal: No edema, cyanosis, or clubbing. Neuro: Alert, answering all questions appropriately. Cranial nerves grossly intact. Skin: No rashes or petechiae noted. Psych: Normal affect.  LAB RESULTS:  Lab Results  Component Value Date   NA 131 (L) 01/11/2019   K 4.2 01/11/2019   CL 96 (L) 01/11/2019   CO2 28 01/11/2019   GLUCOSE 109 (H) 01/11/2019   BUN 19 01/11/2019   CREATININE 0.74 01/11/2019   CALCIUM 9.3 01/11/2019   PROT 7.3 01/11/2019   ALBUMIN 4.0 01/11/2019   AST 24 01/11/2019   ALT 21 01/11/2019   ALKPHOS 63 01/11/2019   BILITOT 0.8 01/11/2019   GFRNONAA >60 01/11/2019   GFRAA >60 01/11/2019    Lab Results  Component Value Date   WBC 5.9 01/11/2019   NEUTROABS 3.3 01/11/2019   HGB 12.8 (L) 01/11/2019   HCT 38.1 (L) 01/11/2019   MCV 96.0 01/11/2019   PLT 251 01/11/2019     STUDIES: Nm Pet Image Initial (pi) Skull Base To Thigh  Result Date: 01/02/2019 CLINICAL DATA:  Initial treatment strategy for bladder carcinoma. EXAM: NUCLEAR MEDICINE PET SKULL BASE TO THIGH TECHNIQUE: 10.9 mCi F-18 FDG was injected intravenously. Full-ring PET imaging was performed from the skull base to thigh after the radiotracer. CT data was obtained and used for attenuation correction and anatomic localization. Fasting blood glucose: 101 mg/dl COMPARISON:  CT 11/23/2018 FINDINGS: Mediastinal blood pool activity: SUV max 1.71 NECK: No hypermetabolic lymph nodes in the neck. Incidental CT findings: none CHEST: No hypermetabolic mediastinal or hilar nodes. No suspicious pulmonary nodules on the CT scan. Incidental CT findings: Port in the anterior chest wall with tip in  distal SVC. ABDOMEN/PELVIS: No abnormal hypermetabolic activity within the liver, pancreas, adrenal glands, or spleen. No hypermetabolic lymph nodes in the abdomen or pelvis. Bladder not evaluated adequately with excreted FDG within the urinary bladder. Incidental CT findings: Prostate enlarged. Atherosclerotic calcification of the aorta. SKELETON: No focal hypermetabolic activity to suggest skeletal metastasis. Incidental CT findings: none IMPRESSION: No evidence of bladder cancer metastasis by FDG PET imaging. Electronically Signed   By: Suzy Bouchard M.D.   On: 01/02/2019 12:37    ASSESSMENT: Stage II invasive bladder cancer  PLAN:   1.  Stage II invasive bladder cancer: Pathology and imaging results reviewed independently.  PET scan results from January 02, 2019 reviewed independently and reported as above with no obvious metastatic disease.  Patient will receive neoadjuvant cisplatin and gemcitabine on day 1 with gemcitabine only on day 8.  This will be a 21-day cycle.  Proceed with cycle 1, day 1 of cisplatin and gemcitabine.  Return to clinic in 1 week for further evaluation and consideration of cycle  1, day 8 which will be gemcitabine only.  Plan to do a total of 4 cycles.    I spent a total of 30 minutes face-to-face with the patient of which greater than 50% of the visit was spent in counseling and coordination of care as detailed above.    Patient expressed understanding and was in agreement with this plan. He also understands that He can call clinic at any time with any questions, concerns, or complaints.   Cancer Staging Bladder cancer [facility]) Staging form: Urinary Bladder, AJCC 8th Edition - Clinical stage from 12/25/2018: Stage II (cT2, cN0, cM0) - Signed by Lloyd Huger, MD on 12/25/2018   Lloyd Huger, MD   01/12/2019 9:48 AM

## 2019-01-08 ENCOUNTER — Other Ambulatory Visit: Payer: Self-pay | Admitting: Oncology

## 2019-01-11 ENCOUNTER — Other Ambulatory Visit: Payer: Self-pay

## 2019-01-11 ENCOUNTER — Inpatient Hospital Stay: Payer: Medicare Other

## 2019-01-11 ENCOUNTER — Inpatient Hospital Stay: Payer: Medicare Other | Attending: Oncology

## 2019-01-11 ENCOUNTER — Inpatient Hospital Stay (HOSPITAL_BASED_OUTPATIENT_CLINIC_OR_DEPARTMENT_OTHER): Payer: Medicare Other | Admitting: Oncology

## 2019-01-11 VITALS — BP 155/69 | HR 80 | Temp 97.3°F | Ht 68.0 in | Wt 190.3 lb

## 2019-01-11 DIAGNOSIS — D72819 Decreased white blood cell count, unspecified: Secondary | ICD-10-CM | POA: Diagnosis not present

## 2019-01-11 DIAGNOSIS — Z7982 Long term (current) use of aspirin: Secondary | ICD-10-CM | POA: Diagnosis not present

## 2019-01-11 DIAGNOSIS — C678 Malignant neoplasm of overlapping sites of bladder: Secondary | ICD-10-CM

## 2019-01-11 DIAGNOSIS — N400 Enlarged prostate without lower urinary tract symptoms: Secondary | ICD-10-CM | POA: Diagnosis not present

## 2019-01-11 DIAGNOSIS — Z87891 Personal history of nicotine dependence: Secondary | ICD-10-CM | POA: Diagnosis not present

## 2019-01-11 DIAGNOSIS — C679 Malignant neoplasm of bladder, unspecified: Secondary | ICD-10-CM

## 2019-01-11 DIAGNOSIS — I10 Essential (primary) hypertension: Secondary | ICD-10-CM

## 2019-01-11 DIAGNOSIS — E871 Hypo-osmolality and hyponatremia: Secondary | ICD-10-CM | POA: Diagnosis not present

## 2019-01-11 DIAGNOSIS — Z79899 Other long term (current) drug therapy: Secondary | ICD-10-CM | POA: Diagnosis not present

## 2019-01-11 DIAGNOSIS — Z5111 Encounter for antineoplastic chemotherapy: Secondary | ICD-10-CM | POA: Diagnosis present

## 2019-01-11 DIAGNOSIS — D696 Thrombocytopenia, unspecified: Secondary | ICD-10-CM | POA: Diagnosis not present

## 2019-01-11 LAB — COMPREHENSIVE METABOLIC PANEL
ALBUMIN: 4 g/dL (ref 3.5–5.0)
ALT: 21 U/L (ref 0–44)
AST: 24 U/L (ref 15–41)
Alkaline Phosphatase: 63 U/L (ref 38–126)
Anion gap: 7 (ref 5–15)
BILIRUBIN TOTAL: 0.8 mg/dL (ref 0.3–1.2)
BUN: 19 mg/dL (ref 8–23)
CO2: 28 mmol/L (ref 22–32)
Calcium: 9.3 mg/dL (ref 8.9–10.3)
Chloride: 96 mmol/L — ABNORMAL LOW (ref 98–111)
Creatinine, Ser: 0.74 mg/dL (ref 0.61–1.24)
GFR calc Af Amer: >60 mL/min (ref >60)
GFR calc non Af Amer: >60 mL/min (ref >60)
GLUCOSE: 109 mg/dL — AB (ref 70–99)
Potassium: 4.2 mmol/L (ref 3.5–5.1)
Sodium: 131 mmol/L — ABNORMAL LOW (ref 135–145)
TOTAL PROTEIN: 7.3 g/dL (ref 6.5–8.1)

## 2019-01-11 LAB — CBC WITH DIFFERENTIAL/PLATELET
Abs Immature Granulocytes: 0.05 10*3/uL (ref 0.00–0.07)
Basophils Absolute: 0 10*3/uL (ref 0.0–0.1)
Basophils Relative: 1 %
Eosinophils Absolute: 0.4 10*3/uL (ref 0.0–0.5)
Eosinophils Relative: 6 %
HCT: 38.1 % — ABNORMAL LOW (ref 39.0–52.0)
Hemoglobin: 12.8 g/dL — ABNORMAL LOW (ref 13.0–17.0)
Immature Granulocytes: 1 %
Lymphocytes Relative: 25 %
Lymphs Abs: 1.5 10*3/uL (ref 0.7–4.0)
MCH: 32.2 pg (ref 26.0–34.0)
MCHC: 33.6 g/dL (ref 30.0–36.0)
MCV: 96 fL (ref 80.0–100.0)
Monocytes Absolute: 0.7 10*3/uL (ref 0.1–1.0)
Monocytes Relative: 11 %
Neutro Abs: 3.3 10*3/uL (ref 1.7–7.7)
Neutrophils Relative %: 56 %
PLATELETS: 251 10*3/uL (ref 150–400)
RBC: 3.97 MIL/uL — ABNORMAL LOW (ref 4.22–5.81)
RDW: 13.2 % (ref 11.5–15.5)
WBC: 5.9 10*3/uL (ref 4.0–10.5)
nRBC: 0 % (ref 0.0–0.2)

## 2019-01-11 MED ORDER — SODIUM CHLORIDE 0.9% FLUSH
10.0000 mL | Freq: Once | INTRAVENOUS | Status: AC
  Administered 2019-01-11: 10 mL via INTRAVENOUS
  Filled 2019-01-11: qty 10

## 2019-01-11 MED ORDER — SODIUM CHLORIDE 0.9 % IV SOLN
Freq: Once | INTRAVENOUS | Status: AC
  Administered 2019-01-11: 13:00:00 via INTRAVENOUS
  Filled 2019-01-11: qty 5

## 2019-01-11 MED ORDER — POTASSIUM CHLORIDE 2 MEQ/ML IV SOLN
Freq: Once | INTRAVENOUS | Status: AC
  Administered 2019-01-11: 11:00:00 via INTRAVENOUS
  Filled 2019-01-11: qty 10

## 2019-01-11 MED ORDER — SODIUM CHLORIDE 0.9 % IV SOLN
70.0000 mg/m2 | Freq: Once | INTRAVENOUS | Status: AC
  Administered 2019-01-11: 142 mg via INTRAVENOUS
  Filled 2019-01-11: qty 50

## 2019-01-11 MED ORDER — SODIUM CHLORIDE 0.9 % IV SOLN
Freq: Once | INTRAVENOUS | Status: AC
  Filled 2019-01-11: qty 250

## 2019-01-11 MED ORDER — SODIUM CHLORIDE 0.9 % IV SOLN
2000.0000 mg | Freq: Once | INTRAVENOUS | Status: AC
  Administered 2019-01-11: 2000 mg via INTRAVENOUS
  Filled 2019-01-11: qty 52.6

## 2019-01-11 MED ORDER — HEPARIN SOD (PORK) LOCK FLUSH 100 UNIT/ML IV SOLN
500.0000 [IU] | Freq: Once | INTRAVENOUS | Status: AC
  Administered 2019-01-11: 500 [IU] via INTRAVENOUS
  Filled 2019-01-11: qty 5

## 2019-01-11 MED ORDER — PALONOSETRON HCL INJECTION 0.25 MG/5ML
0.2500 mg | Freq: Once | INTRAVENOUS | Status: AC
  Administered 2019-01-11: 0.25 mg via INTRAVENOUS
  Filled 2019-01-11: qty 5

## 2019-01-11 MED ORDER — SODIUM CHLORIDE 0.9 % IV SOLN
Freq: Once | INTRAVENOUS | Status: AC
  Administered 2019-01-11: 10:00:00 via INTRAVENOUS
  Filled 2019-01-11: qty 250

## 2019-01-11 NOTE — Progress Notes (Signed)
Patient is here today to follow up on his malignant neoplasm of overlapping sites of bladder. Patient stated that he is doing well with no complaints.

## 2019-01-14 NOTE — Progress Notes (Signed)
[place]  Telephone:(336) [phone] [phone]) [phone]  ID: Philip [name] OB: Mar 31, 1941  MR#: [id number]  FXT#:[id number]  Patient Care Team: Kirk Ruths, MD as PCP - General (Internal Medicine)  CHIEF COMPLAINT: Stage II invasive bladder cancer.  INTERVAL HISTORY: Patient returns to clinic today for further evaluation and consideration of cycle 1, day 8 of neoadjuvant cisplatin and gemcitabine.  Gemcitabine only.  Patient tolerated his first infusion well without significant side effects.  He continues to feel well and remains asymptomatic. He has no neurologic complaints.  He denies any recent fevers or illnesses.  He has a good appetite and denies weight loss.  He has no chest pain or shortness of breath.  He denies any nausea, vomiting, constipation, or diarrhea.  He has no urinary complaints.  Patient offers no specific complaints today.  REVIEW OF SYSTEMS:   Review of Systems  Constitutional: Negative.  Negative for fever, malaise/fatigue and weight loss.  Respiratory: Negative.  Negative for cough, hemoptysis and shortness of breath.   Cardiovascular: Negative.  Negative for chest pain and leg swelling.  Gastrointestinal: Positive for constipation. Negative for abdominal pain.  Genitourinary: Negative.  Negative for dysuria and hematuria.  Musculoskeletal: Negative.  Negative for back pain.  Skin: Negative.  Negative for rash.  Neurological: Negative.  Negative for focal weakness, weakness and headaches.  Psychiatric/Behavioral: Negative.  The patient is not nervous/anxious.     As per HPI. Otherwise, a complete review of systems is negative.  PAST MEDICAL HISTORY: Past Medical History:  Diagnosis Date  . Hypertension     PAST SURGICAL HISTORY: Past Surgical History:  Procedure Laterality Date  . BACK SURGERY    . CATARACT EXTRACTION    . HERNIA REPAIR    . PORTA CATH INSERTION N/A 01/01/2019   Procedure: PORTA CATH INSERTION;  Surgeon: Algernon Huxley, MD;  Location: [place];  Service: Cardiovascular;  Laterality: N/A;  . TONSILLECTOMY    . TRANSURETHRAL RESECTION OF BLADDER TUMOR N/A 12/08/2018   Procedure: TRANSURETHRAL RESECTION OF BLADDER TUMOR (TURBT);  Surgeon: Billey Co, MD;  Location: [facility];  Service: Urology;  Laterality: N/A;    FAMILY HISTORY: No family history on file.  ADVANCED DIRECTIVES (Y/N):  N  HEALTH MAINTENANCE: Social History   Tobacco Use  . Smoking status: Former Smoker    Years: 35.00    Types: Cigarettes    Last attempt to quit: 11/29/1993    Years since quitting: 25.1  . Smokeless tobacco: Never Used  Substance Use Topics  . Alcohol use: Yes    Comment: a couple of drinks a day all the above  . Drug use: Never     Colonoscopy:  PAP:  Bone density:  Lipid panel:  No Known Allergies  Current Outpatient Medications  Medication Sig Dispense Refill  . aspirin EC 81 MG tablet Take by mouth.    Marland Kitchen atorvastatin (LIPITOR) 20 MG tablet Take 20 mg by mouth daily.    Marland Kitchen lidocaine-prilocaine (EMLA) cream Apply to affected area once    . lisinopril-hydrochlorothiazide (PRINZIDE,ZESTORETIC) 20-12.5 MG tablet Take 1 tablet by mouth daily.     . Multiple Vitamins-Minerals (CENTRUM WOMEN) TABS Take 1 tablet by mouth daily.    . ondansetron (ZOFRAN) 8 MG tablet Take 1 tablet (8 mg total) by mouth 2 (two) times daily as needed. Start on the third day after cisplatin chemotherapy. 30 tablet 2  . prochlorperazine (COMPAZINE) 10 MG tablet Take 1 tablet (10 mg  total) by mouth every 6 (six) hours as needed (Nausea or vomiting). 60 tablet 2  . sodium fluoride (DENTAGEL) 1.1 % GEL dental gel 1 ampule.    Marland Kitchen VITAMIN D PO Take 2,000 Units by mouth once a week.     . Vitamin D, Ergocalciferol, (DRISDOL) 1.25 MG (50000 UT) CAPS capsule Take by mouth.     No current facility-administered medications for this visit.    Facility-Administered Medications Ordered in Other Visits  Medication Dose  Route Frequency Provider Last Rate Last Dose  . prochlorperazine (COMPAZINE) tablet 10 mg  10 mg Oral Once Lloyd Huger, MD      . sodium chloride flush (NS) 0.9 % injection 10 mL  10 mL Intracatheter PRN Lloyd Huger, MD        OBJECTIVE: Vitals:   01/18/19 0858  BP: 132/77  Pulse: 74  Temp: 97.6 F (36.4 C)     Body mass index is 28.54 kg/m.    ECOG FS:0 - Asymptomatic  General: Well-developed, well-nourished, no acute distress. Eyes: Pink conjunctiva, anicteric sclera. HEENT: Normocephalic, moist mucous membranes. Lungs: Clear to auscultation bilaterally. Heart: Regular rate and rhythm. No rubs, murmurs, or gallops. Abdomen: Soft, nontender, nondistended. No organomegaly noted, normoactive bowel sounds. Musculoskeletal: No edema, cyanosis, or clubbing. Neuro: Alert, answering all questions appropriately. Cranial nerves grossly intact. Skin: No rashes or petechiae noted. Psych: Normal affect.  LAB RESULTS:  Lab Results  Component Value Date   NA 129 (L) 01/18/2019   K 4.0 01/18/2019   CL 94 (L) 01/18/2019   CO2 28 01/18/2019   GLUCOSE 96 01/18/2019   BUN 21 01/18/2019   CREATININE 0.88 01/18/2019   CALCIUM 9.0 01/18/2019   PROT 7.0 01/18/2019   ALBUMIN 3.9 01/18/2019   AST 26 01/18/2019   ALT 31 01/18/2019   ALKPHOS 56 01/18/2019   BILITOT 0.9 01/18/2019   GFRNONAA >60 01/18/2019   GFRAA >60 01/18/2019    Lab Results  Component Value Date   WBC 3.1 (L) 01/18/2019   NEUTROABS 1.8 01/18/2019   HGB 12.5 (L) 01/18/2019   HCT 35.8 (L) 01/18/2019   MCV 95.2 01/18/2019   PLT 139 (L) 01/18/2019     STUDIES: Nm Pet Image Initial (pi) Skull Base To Thigh  Result Date: 01/02/2019 CLINICAL DATA:  Initial treatment strategy for bladder carcinoma. EXAM: NUCLEAR MEDICINE PET SKULL BASE TO THIGH TECHNIQUE: 10.9 mCi F-18 FDG was injected intravenously. Full-ring PET imaging was performed from the skull base to thigh after the radiotracer. CT data was  obtained and used for attenuation correction and anatomic localization. Fasting blood glucose: 101 mg/dl COMPARISON:  CT 11/23/2018 FINDINGS: Mediastinal blood pool activity: SUV max 1.71 NECK: No hypermetabolic lymph nodes in the neck. Incidental CT findings: none CHEST: No hypermetabolic mediastinal or hilar nodes. No suspicious pulmonary nodules on the CT scan. Incidental CT findings: Port in the anterior chest wall with tip in distal SVC. ABDOMEN/PELVIS: No abnormal hypermetabolic activity within the liver, pancreas, adrenal glands, or spleen. No hypermetabolic lymph nodes in the abdomen or pelvis. Bladder not evaluated adequately with excreted FDG within the urinary bladder. Incidental CT findings: Prostate enlarged. Atherosclerotic calcification of the aorta. SKELETON: No focal hypermetabolic activity to suggest skeletal metastasis. Incidental CT findings: none IMPRESSION: No evidence of bladder cancer metastasis by FDG PET imaging. Electronically Signed   By: Suzy Bouchard M.D.   On: 01/02/2019 12:37    ASSESSMENT: Stage II invasive bladder cancer  PLAN:   1.  Stage  II invasive bladder cancer: Pathology and imaging results reviewed independently.  PET scan results from January 02, 2019 reviewed independently with no obvious metastatic disease.  Patient will receive neoadjuvant cisplatin and gemcitabine on day 1 with gemcitabine only on day 8.  This will be a 21-day cycle.  Proceed with cycle 1, day 8 of treatment today which is gemcitabine only.  Return to clinic in 2 weeks for further evaluation and consideration of cycle 2, day 1. Plan to do a total of 4 cycles. 2.  Leukopenia: Mild, monitor. 3.  Thrombocytopenia: Mild, monitor. 4.  Hyponatremia: Monitor.  Patient expressed understanding and was in agreement with this plan. He also understands that He can call clinic at any time with any questions, concerns, or complaints.   Cancer Staging Bladder cancer [facility]) Staging form: Urinary  Bladder, AJCC 8th Edition - Clinical stage from 12/25/2018: Stage II (cT2, cN0, cM0) - Signed by Lloyd Huger, MD on 12/25/2018   Lloyd Huger, MD   01/18/2019 10:56 AM

## 2019-01-18 ENCOUNTER — Other Ambulatory Visit: Payer: Self-pay

## 2019-01-18 ENCOUNTER — Inpatient Hospital Stay: Payer: Medicare Other

## 2019-01-18 ENCOUNTER — Inpatient Hospital Stay (HOSPITAL_BASED_OUTPATIENT_CLINIC_OR_DEPARTMENT_OTHER): Payer: Medicare Other | Admitting: Oncology

## 2019-01-18 VITALS — BP 132/77 | HR 74 | Temp 97.6°F | Ht 68.0 in | Wt 187.7 lb

## 2019-01-18 DIAGNOSIS — C679 Malignant neoplasm of bladder, unspecified: Secondary | ICD-10-CM

## 2019-01-18 DIAGNOSIS — C678 Malignant neoplasm of overlapping sites of bladder: Secondary | ICD-10-CM

## 2019-01-18 DIAGNOSIS — Z7982 Long term (current) use of aspirin: Secondary | ICD-10-CM

## 2019-01-18 DIAGNOSIS — N400 Enlarged prostate without lower urinary tract symptoms: Secondary | ICD-10-CM

## 2019-01-18 DIAGNOSIS — Z87891 Personal history of nicotine dependence: Secondary | ICD-10-CM

## 2019-01-18 DIAGNOSIS — Z5111 Encounter for antineoplastic chemotherapy: Secondary | ICD-10-CM | POA: Diagnosis not present

## 2019-01-18 DIAGNOSIS — Z79899 Other long term (current) drug therapy: Secondary | ICD-10-CM

## 2019-01-18 DIAGNOSIS — I10 Essential (primary) hypertension: Secondary | ICD-10-CM

## 2019-01-18 LAB — COMPREHENSIVE METABOLIC PANEL
ALT: 31 U/L (ref 0–44)
ANION GAP: 7 (ref 5–15)
AST: 26 U/L (ref 15–41)
Albumin: 3.9 g/dL (ref 3.5–5.0)
Alkaline Phosphatase: 56 U/L (ref 38–126)
BUN: 21 mg/dL (ref 8–23)
CO2: 28 mmol/L (ref 22–32)
Calcium: 9 mg/dL (ref 8.9–10.3)
Chloride: 94 mmol/L — ABNORMAL LOW (ref 98–111)
Creatinine, Ser: 0.88 mg/dL (ref 0.61–1.24)
GFR calc Af Amer: >60 mL/min (ref >60)
GFR calc non Af Amer: >60 mL/min (ref >60)
Glucose, Bld: 96 mg/dL (ref 70–99)
Potassium: 4 mmol/L (ref 3.5–5.1)
Sodium: 129 mmol/L — ABNORMAL LOW (ref 135–145)
TOTAL PROTEIN: 7 g/dL (ref 6.5–8.1)
Total Bilirubin: 0.9 mg/dL (ref 0.3–1.2)

## 2019-01-18 LAB — CBC WITH DIFFERENTIAL/PLATELET
Abs Immature Granulocytes: 0.03 10*3/uL (ref 0.00–0.07)
Basophils Absolute: 0 10*3/uL (ref 0.0–0.1)
Basophils Relative: 1 %
Eosinophils Absolute: 0.1 10*3/uL (ref 0.0–0.5)
Eosinophils Relative: 3 %
HCT: 35.8 % — ABNORMAL LOW (ref 39.0–52.0)
Hemoglobin: 12.5 g/dL — ABNORMAL LOW (ref 13.0–17.0)
Immature Granulocytes: 1 %
Lymphocytes Relative: 32 %
Lymphs Abs: 1 10*3/uL (ref 0.7–4.0)
MCH: 33.2 pg (ref 26.0–34.0)
MCHC: 34.9 g/dL (ref 30.0–36.0)
MCV: 95.2 fL (ref 80.0–100.0)
Monocytes Absolute: 0.2 10*3/uL (ref 0.1–1.0)
Monocytes Relative: 5 %
NEUTROS ABS: 1.8 10*3/uL (ref 1.7–7.7)
Neutrophils Relative %: 58 %
Platelets: 139 10*3/uL — ABNORMAL LOW (ref 150–400)
RBC: 3.76 MIL/uL — ABNORMAL LOW (ref 4.22–5.81)
RDW: 12.7 % (ref 11.5–15.5)
WBC: 3.1 10*3/uL — ABNORMAL LOW (ref 4.0–10.5)
nRBC: 0 % (ref 0.0–0.2)

## 2019-01-18 MED ORDER — SODIUM CHLORIDE 0.9% FLUSH
10.0000 mL | INTRAVENOUS | Status: DC | PRN
  Filled 2019-01-18: qty 10

## 2019-01-18 MED ORDER — PROCHLORPERAZINE MALEATE 10 MG PO TABS
10.0000 mg | ORAL_TABLET | Freq: Once | ORAL | Status: DC
  Filled 2019-01-18: qty 1

## 2019-01-18 MED ORDER — SODIUM CHLORIDE 0.9 % IV SOLN
Freq: Once | INTRAVENOUS | Status: AC
  Administered 2019-01-18: 10:00:00 via INTRAVENOUS
  Filled 2019-01-18: qty 250

## 2019-01-18 MED ORDER — HEPARIN SOD (PORK) LOCK FLUSH 100 UNIT/ML IV SOLN
500.0000 [IU] | Freq: Once | INTRAVENOUS | Status: AC | PRN
  Administered 2019-01-18: 500 [IU]
  Filled 2019-01-18 (×2): qty 5

## 2019-01-18 MED ORDER — SODIUM CHLORIDE 0.9 % IV SOLN
2000.0000 mg | Freq: Once | INTRAVENOUS | Status: AC
  Administered 2019-01-18: 2000 mg via INTRAVENOUS
  Filled 2019-01-18: qty 52.6

## 2019-01-18 NOTE — Progress Notes (Signed)
Patient is here today to follow up on his bladder cancer. Patient denied fever, chills, nausea, vomiting, diarrhea, poor appetite. However, he's been suffering from constipation but takes daily stool softeners that tend to help with his bowel movements.

## 2019-01-19 ENCOUNTER — Telehealth: Payer: Self-pay | Admitting: Urology

## 2019-01-19 NOTE — Telephone Encounter (Signed)
Patient called the office this morning to talk to Dr. Diamantina Providence.  He stated that he has some questions that he would like to ask.    I explained that Dr. Diamantina Providence is in the OR this morning.  He is requesting a call back this afternoon after 2pm.

## 2019-01-29 NOTE — Progress Notes (Signed)
[place]  [phone]) [phone] Fax:(336) [phone]  ID: Philip [name] OB: [birth year]  MR#: [id number]  PXT#:[id number]  Patient Care Team: Kirk Ruths, MD as PCP - General (Internal Medicine)  CHIEF COMPLAINT: Stage II invasive bladder cancer.  INTERVAL HISTORY: Patient returns to clinic today for further evaluation and consideration of cycle 2, day 1 of cisplatin and gemcitabine.  He continues to tolerate his treatments well without significant side effects.  He currently feels well and is asymptomatic. He has no neurologic complaints.  He denies any recent fevers or illnesses.  He has a good appetite and denies weight loss.  He has no chest pain or shortness of breath.  He denies any nausea, vomiting, constipation, or diarrhea.  He has no urinary complaints.  Patient feels at his baseline offers no specific complaints today.  REVIEW OF SYSTEMS:   Review of Systems  Constitutional: Negative.  Negative for fever, malaise/fatigue and weight loss.  Respiratory: Negative.  Negative for cough, hemoptysis and shortness of breath.   Cardiovascular: Negative.  Negative for chest pain and leg swelling.  Gastrointestinal: Negative.  Negative for abdominal pain and constipation.  Genitourinary: Negative.  Negative for dysuria and hematuria.  Musculoskeletal: Negative.  Negative for back pain.  Skin: Negative.  Negative for rash.  Neurological: Negative.  Negative for focal weakness, weakness and headaches.  Psychiatric/Behavioral: Negative.  The patient is not nervous/anxious.     As per HPI. Otherwise, a complete review of systems is negative.  PAST MEDICAL HISTORY: Past Medical History:  Diagnosis Date  . Hypertension     PAST SURGICAL HISTORY: Past Surgical History:  Procedure Laterality Date  . BACK SURGERY    . CATARACT EXTRACTION    . HERNIA REPAIR    . PORTA CATH INSERTION N/A 01/01/2019   Procedure: PORTA CATH INSERTION;  Surgeon: Algernon Huxley, MD;   Location: [place] [facility];  Service: Cardiovascular;  Laterality: N/A;  . TONSILLECTOMY    . TRANSURETHRAL RESECTION OF BLADDER TUMOR N/A 12/08/2018   Procedure: TRANSURETHRAL RESECTION OF BLADDER TUMOR (TURBT);  Surgeon: Billey Co, MD;  Location: [facility];  Service: Urology;  Laterality: N/A;    FAMILY HISTORY: History reviewed. No pertinent family history.  ADVANCED DIRECTIVES (Y/N):  N  HEALTH MAINTENANCE: Social History   Tobacco Use  . Smoking status: Former Smoker    Years: 35.00    Types: Cigarettes    Last attempt to quit: 11/29/1993    Years since quitting: 25.1  . Smokeless tobacco: Never Used  Substance Use Topics  . Alcohol use: Yes    Comment: a couple of drinks a day all the above  . Drug use: Never     Colonoscopy:  PAP:  Bone density:  Lipid panel:  No Known Allergies  Current Outpatient Medications  Medication Sig Dispense Refill  . aspirin EC 81 MG tablet Take by mouth.    Marland Kitchen atorvastatin (LIPITOR) 20 MG tablet Take 20 mg by mouth daily.    Marland Kitchen lidocaine-prilocaine (EMLA) cream Apply to affected area once    . lisinopril-hydrochlorothiazide (PRINZIDE,ZESTORETIC) 20-12.5 MG tablet Take 1 tablet by mouth daily.     . Multiple Vitamins-Minerals (CENTRUM WOMEN) TABS Take 1 tablet by mouth daily.    . ondansetron (ZOFRAN) 8 MG tablet Take 1 tablet (8 mg total) by mouth 2 (two) times daily as needed. Start on the third day after cisplatin chemotherapy. 30 tablet 2  . prochlorperazine (COMPAZINE) 10 MG tablet Take 1 tablet (  10 mg total) by mouth every 6 (six) hours as needed (Nausea or vomiting). 60 tablet 2  . sodium fluoride (DENTAGEL) 1.1 % GEL dental gel 1 ampule.    Marland Kitchen VITAMIN D PO Take 2,000 Units by mouth once a week.     . Vitamin D, Ergocalciferol, (DRISDOL) 1.25 MG (50000 UT) CAPS capsule Take by mouth.     No current facility-administered medications for this visit.     OBJECTIVE: Vitals:   02/01/19 0903  BP: (!) 151/63  Pulse: 81    Temp: 98.6 F (37 C)     Body mass index is 29.86 kg/m.    ECOG FS:0 - Asymptomatic  General: Well-developed, well-nourished, no acute distress. Eyes: Pink conjunctiva, anicteric sclera. HEENT: Normocephalic, moist mucous membranes. Lungs: Clear to auscultation bilaterally. Heart: Regular rate and rhythm. No rubs, murmurs, or gallops. Abdomen: Soft, nontender, nondistended. No organomegaly noted, normoactive bowel sounds. Musculoskeletal: No edema, cyanosis, or clubbing. Neuro: Alert, answering all questions appropriately. Cranial nerves grossly intact. Skin: No rashes or petechiae noted. Psych: Normal affect.  LAB RESULTS:  Lab Results  Component Value Date   NA 130 (L) 02/01/2019   K 4.1 02/01/2019   CL 97 (L) 02/01/2019   CO2 26 02/01/2019   GLUCOSE 104 (H) 02/01/2019   BUN 20 02/01/2019   CREATININE 0.78 02/01/2019   CALCIUM 8.9 02/01/2019   PROT 6.8 02/01/2019   ALBUMIN 3.8 02/01/2019   AST 20 02/01/2019   ALT 20 02/01/2019   ALKPHOS 65 02/01/2019   BILITOT 0.5 02/01/2019   GFRNONAA >60 02/01/2019   GFRAA >60 02/01/2019    Lab Results  Component Value Date   WBC 4.0 02/01/2019   NEUTROABS 1.5 (L) 02/01/2019   HGB 11.0 (L) 02/01/2019   HCT 32.2 (L) 02/01/2019   MCV 96.7 02/01/2019   PLT 309 02/01/2019     STUDIES: No results found.  ASSESSMENT: Stage II invasive bladder cancer  PLAN:   1.  Stage II invasive bladder cancer: Pathology and imaging results reviewed independently.  PET scan results from January 02, 2019 reviewed independently with no obvious metastatic disease.  Patient will receive neoadjuvant cisplatin and gemcitabine on day 1 with gemcitabine only on day 8.  This will be a 21-day cycle.  Proceed with cycle 2, day 1 of treatment today.  Return to clinic in 1 week for further evaluation and consideration of cycle 2, day 8. Plan to do a total of 4 cycles. 2.  Leukopenia: Resolved. 3.  Thrombocytopenia: Resolved. 4.  Anemia: Patient's  hemoglobin has trended down slightly to 11.0, monitor. 5.  Hyponatremia: Chronic and unchanged.  Patient expressed understanding and was in agreement with this plan. He also understands that He can call clinic at any time with any questions, concerns, or complaints.   Cancer Staging Bladder cancer [facility]) Staging form: Urinary Bladder, AJCC 8th Edition - Clinical stage from 12/25/2018: Stage II (cT2, cN0, cM0) - Signed by Lloyd Huger, MD on 12/25/2018   Lloyd Huger, MD   02/02/2019 9:45 AM

## 2019-02-01 ENCOUNTER — Inpatient Hospital Stay (HOSPITAL_BASED_OUTPATIENT_CLINIC_OR_DEPARTMENT_OTHER): Payer: Medicare Other | Admitting: Oncology

## 2019-02-01 ENCOUNTER — Other Ambulatory Visit: Payer: Medicare Other

## 2019-02-01 ENCOUNTER — Inpatient Hospital Stay: Payer: Medicare Other

## 2019-02-01 ENCOUNTER — Inpatient Hospital Stay: Payer: Medicare Other | Attending: Oncology

## 2019-02-01 ENCOUNTER — Encounter (INDEPENDENT_AMBULATORY_CARE_PROVIDER_SITE_OTHER): Payer: Self-pay

## 2019-02-01 ENCOUNTER — Encounter: Payer: Self-pay | Admitting: Oncology

## 2019-02-01 ENCOUNTER — Ambulatory Visit: Payer: Medicare Other

## 2019-02-01 ENCOUNTER — Ambulatory Visit: Payer: Medicare Other | Admitting: Oncology

## 2019-02-01 ENCOUNTER — Other Ambulatory Visit: Payer: Self-pay

## 2019-02-01 VITALS — BP 151/63 | HR 81 | Temp 98.6°F | Ht 68.0 in | Wt 196.4 lb

## 2019-02-01 DIAGNOSIS — Z79899 Other long term (current) drug therapy: Secondary | ICD-10-CM

## 2019-02-01 DIAGNOSIS — C678 Malignant neoplasm of overlapping sites of bladder: Secondary | ICD-10-CM

## 2019-02-01 DIAGNOSIS — E871 Hypo-osmolality and hyponatremia: Secondary | ICD-10-CM | POA: Insufficient documentation

## 2019-02-01 DIAGNOSIS — D649 Anemia, unspecified: Secondary | ICD-10-CM | POA: Diagnosis not present

## 2019-02-01 DIAGNOSIS — C679 Malignant neoplasm of bladder, unspecified: Secondary | ICD-10-CM

## 2019-02-01 DIAGNOSIS — Z5111 Encounter for antineoplastic chemotherapy: Secondary | ICD-10-CM | POA: Insufficient documentation

## 2019-02-01 LAB — COMPREHENSIVE METABOLIC PANEL
ALK PHOS: 65 U/L (ref 38–126)
ALT: 20 U/L (ref 0–44)
AST: 20 U/L (ref 15–41)
Albumin: 3.8 g/dL (ref 3.5–5.0)
Anion gap: 7 (ref 5–15)
BUN: 20 mg/dL (ref 8–23)
CALCIUM: 8.9 mg/dL (ref 8.9–10.3)
CO2: 26 mmol/L (ref 22–32)
Chloride: 97 mmol/L — ABNORMAL LOW (ref 98–111)
Creatinine, Ser: 0.78 mg/dL (ref 0.61–1.24)
GFR calc Af Amer: >60 mL/min (ref >60)
GFR calc non Af Amer: >60 mL/min (ref >60)
Glucose, Bld: 104 mg/dL — ABNORMAL HIGH (ref 70–99)
Potassium: 4.1 mmol/L (ref 3.5–5.1)
Sodium: 130 mmol/L — ABNORMAL LOW (ref 135–145)
TOTAL PROTEIN: 6.8 g/dL (ref 6.5–8.1)
Total Bilirubin: 0.5 mg/dL (ref 0.3–1.2)

## 2019-02-01 LAB — CBC WITH DIFFERENTIAL/PLATELET
Abs Immature Granulocytes: 0.06 10*3/uL (ref 0.00–0.07)
BASOS PCT: 1 %
Basophils Absolute: 0 10*3/uL (ref 0.0–0.1)
Eosinophils Absolute: 0.2 10*3/uL (ref 0.0–0.5)
Eosinophils Relative: 4 %
HEMATOCRIT: 32.2 % — AB (ref 39.0–52.0)
Hemoglobin: 11 g/dL — ABNORMAL LOW (ref 13.0–17.0)
Immature Granulocytes: 2 %
Lymphocytes Relative: 35 %
Lymphs Abs: 1.4 10*3/uL (ref 0.7–4.0)
MCH: 33 pg (ref 26.0–34.0)
MCHC: 34.2 g/dL (ref 30.0–36.0)
MCV: 96.7 fL (ref 80.0–100.0)
Monocytes Absolute: 0.9 10*3/uL (ref 0.1–1.0)
Monocytes Relative: 22 %
Neutro Abs: 1.5 10*3/uL — ABNORMAL LOW (ref 1.7–7.7)
Neutrophils Relative %: 36 %
Platelets: 309 10*3/uL (ref 150–400)
RBC: 3.33 MIL/uL — AB (ref 4.22–5.81)
RDW: 13.5 % (ref 11.5–15.5)
WBC: 4 10*3/uL (ref 4.0–10.5)
nRBC: 0 % (ref 0.0–0.2)

## 2019-02-01 MED ORDER — SODIUM CHLORIDE 0.9% FLUSH
10.0000 mL | Freq: Once | INTRAVENOUS | Status: AC
  Administered 2019-02-01: 10 mL via INTRAVENOUS
  Filled 2019-02-01: qty 10

## 2019-02-01 MED ORDER — SODIUM CHLORIDE 0.9 % IV SOLN
Freq: Once | INTRAVENOUS | Status: AC
  Administered 2019-02-01: 10:00:00 via INTRAVENOUS
  Filled 2019-02-01: qty 250

## 2019-02-01 MED ORDER — HEPARIN SOD (PORK) LOCK FLUSH 100 UNIT/ML IV SOLN
500.0000 [IU] | Freq: Once | INTRAVENOUS | Status: AC
  Administered 2019-02-01: 500 [IU] via INTRAVENOUS
  Filled 2019-02-01: qty 5

## 2019-02-01 MED ORDER — HEPARIN SOD (PORK) LOCK FLUSH 100 UNIT/ML IV SOLN: 500.0000 [IU] | Freq: Once | INTRAVENOUS | Status: DC | PRN

## 2019-02-01 MED ORDER — SODIUM CHLORIDE 0.9 % IV SOLN
2000.0000 mg | Freq: Once | INTRAVENOUS | Status: AC
  Administered 2019-02-01: 2000 mg via INTRAVENOUS
  Filled 2019-02-01: qty 52.6

## 2019-02-01 MED ORDER — SODIUM CHLORIDE 0.9 % IV SOLN
70.0000 mg/m2 | Freq: Once | INTRAVENOUS | Status: AC
  Administered 2019-02-01: 142 mg via INTRAVENOUS
  Filled 2019-02-01: qty 142

## 2019-02-01 MED ORDER — SODIUM CHLORIDE 0.9 % IV SOLN
Freq: Once | INTRAVENOUS | Status: AC
  Administered 2019-02-01: 12:00:00 via INTRAVENOUS
  Filled 2019-02-01: qty 5

## 2019-02-01 MED ORDER — POTASSIUM CHLORIDE 2 MEQ/ML IV SOLN
Freq: Once | INTRAVENOUS | Status: AC
  Administered 2019-02-01: 10:00:00 via INTRAVENOUS
  Filled 2019-02-01: qty 1000

## 2019-02-01 MED ORDER — PALONOSETRON HCL INJECTION 0.25 MG/5ML
0.2500 mg | Freq: Once | INTRAVENOUS | Status: AC
  Administered 2019-02-01: 0.25 mg via INTRAVENOUS
  Filled 2019-02-01: qty 5

## 2019-02-01 NOTE — Progress Notes (Signed)
Patient is here today to follow up on her malignant neoplasm of overlapping sites of bladder. Patient stated that he had been doing well with no complaints.

## 2019-02-04 NOTE — Progress Notes (Signed)
[place]  Telephone:(336) [phone] Fax:(336) [phone]  ID: Philip [name] OB: July 24, 1941  MR#: [id number]  OQH#:[id number]  Patient Care Team: Kirk Ruths, MD as PCP - General (Internal Medicine)  CHIEF COMPLAINT: Stage II invasive bladder cancer.  INTERVAL HISTORY: Patient returns to clinic today for further evaluation and consideration of cycle 2, day 8 of cisplatin and gemcitabine.  Gemcitabine only.  He continues to tolerate his treatments well without significant side effects. He currently feels well and is asymptomatic. He has no neurologic complaints.  He denies any recent fevers or illnesses.  He has a good appetite and denies weight loss.  He has no chest pain or shortness of breath.  He denies any nausea, vomiting, constipation, or diarrhea.  He has no urinary complaints.  Patient feels at his baseline offers no specific complaints today.  REVIEW OF SYSTEMS:   Review of Systems  Constitutional: Negative.  Negative for fever, malaise/fatigue and weight loss.  Respiratory: Negative.  Negative for cough, hemoptysis and shortness of breath.   Cardiovascular: Negative.  Negative for chest pain and leg swelling.  Gastrointestinal: Negative.  Negative for abdominal pain and constipation.  Genitourinary: Negative.  Negative for dysuria and hematuria.  Musculoskeletal: Negative.  Negative for back pain.  Skin: Negative.  Negative for rash.  Neurological: Negative.  Negative for focal weakness, weakness and headaches.  Psychiatric/Behavioral: Negative.  The patient is not nervous/anxious.     As per HPI. Otherwise, a complete review of systems is negative.  PAST MEDICAL HISTORY: Past Medical History:  Diagnosis Date  . Hypertension     PAST SURGICAL HISTORY: Past Surgical History:  Procedure Laterality Date  . BACK SURGERY    . CATARACT EXTRACTION    . HERNIA REPAIR    . PORTA CATH INSERTION N/A 01/01/2019   Procedure: PORTA CATH INSERTION;  Surgeon:  Algernon Huxley, MD;  Location: [place] [facility];  Service: Cardiovascular;  Laterality: N/A;  . TONSILLECTOMY    . TRANSURETHRAL RESECTION OF BLADDER TUMOR N/A 12/08/2018   Procedure: TRANSURETHRAL RESECTION OF BLADDER TUMOR (TURBT);  Surgeon: Billey Co, MD;  Location: [facility];  Service: Urology;  Laterality: N/A;    FAMILY HISTORY: History reviewed. No pertinent family history.  ADVANCED DIRECTIVES (Y/N):  N  HEALTH MAINTENANCE: Social History   Tobacco Use  . Smoking status: Former Smoker    Years: 35.00    Types: Cigarettes    Last attempt to quit: 11/29/1993    Years since quitting: 25.2  . Smokeless tobacco: Never Used  Substance Use Topics  . Alcohol use: Yes    Comment: a couple of drinks a day all the above  . Drug use: Never     Colonoscopy:  PAP:  Bone density:  Lipid panel:  No Known Allergies  Current Outpatient Medications  Medication Sig Dispense Refill  . aspirin EC 81 MG tablet Take by mouth.    Marland Kitchen atorvastatin (LIPITOR) 20 MG tablet Take 20 mg by mouth daily.    Marland Kitchen lidocaine-prilocaine (EMLA) cream Apply to affected area once    . lisinopril-hydrochlorothiazide (PRINZIDE,ZESTORETIC) 20-12.5 MG tablet Take 1 tablet by mouth daily.     . Multiple Vitamins-Minerals (CENTRUM WOMEN) TABS Take 1 tablet by mouth daily.    . ondansetron (ZOFRAN) 8 MG tablet Take 1 tablet (8 mg total) by mouth 2 (two) times daily as needed. Start on the third day after cisplatin chemotherapy. 30 tablet 2  . prochlorperazine (COMPAZINE) 10 MG tablet Take  1 tablet (10 mg total) by mouth every 6 (six) hours as needed (Nausea or vomiting). 60 tablet 2  . sodium fluoride (DENTAGEL) 1.1 % GEL dental gel 1 ampule.    Marland Kitchen VITAMIN D PO Take 2,000 Units by mouth once a week.     . Vitamin D, Ergocalciferol, (DRISDOL) 1.25 MG (50000 UT) CAPS capsule Take by mouth.     No current facility-administered medications for this visit.     OBJECTIVE: Vitals:   02/08/19 1013  BP: (!)  151/77  Pulse: 73  Resp: 18  Temp: 98.1 F (36.7 C)     Body mass index is 29.04 kg/m.    ECOG FS:0 - Asymptomatic  General: Well-developed, well-nourished, no acute distress. Eyes: Pink conjunctiva, anicteric sclera. HEENT: Normocephalic, moist mucous membranes. Lungs: Clear to auscultation bilaterally. Heart: Regular rate and rhythm. No rubs, murmurs, or gallops. Abdomen: Soft, nontender, nondistended. No organomegaly noted, normoactive bowel sounds. Musculoskeletal: No edema, cyanosis, or clubbing. Neuro: Alert, answering all questions appropriately. Cranial nerves grossly intact. Skin: No rashes or petechiae noted. Psych: Normal affect.  LAB RESULTS:  Lab Results  Component Value Date   NA 130 (L) 02/08/2019   K 3.9 02/08/2019   CL 96 (L) 02/08/2019   CO2 26 02/08/2019   GLUCOSE 113 (H) 02/08/2019   BUN 21 02/08/2019   CREATININE 1.01 02/08/2019   CALCIUM 8.7 (L) 02/08/2019   PROT 6.9 02/08/2019   ALBUMIN 3.7 02/08/2019   AST 28 02/08/2019   ALT 31 02/08/2019   ALKPHOS 64 02/08/2019   BILITOT 0.5 02/08/2019   GFRNONAA >60 02/08/2019   GFRAA >60 02/08/2019    Lab Results  Component Value Date   WBC 2.8 (L) 02/08/2019   NEUTROABS 1.1 (L) 02/08/2019   HGB 10.6 (L) 02/08/2019   HCT 30.4 (L) 02/08/2019   MCV 95.9 02/08/2019   PLT 203 02/08/2019     STUDIES: No results found.  ASSESSMENT: Stage II invasive bladder cancer  PLAN:   1.  Stage II invasive bladder cancer: Pathology and imaging results reviewed independently.  PET scan results from January 02, 2019 reviewed independently with no obvious metastatic disease.  Patient will receive neoadjuvant cisplatin and gemcitabine on day 1 with gemcitabine only on day 8.  This will be a 21-day cycle.  Proceed with cycle 2, day 8 of treatment today.  Gemcitabine only.  Return to clinic in 2 weeks for further evaluation and consideration of cycle 3, day 1.  Plan to do a total of 4 cycles. 2.  Neutropenia: Proceed  with treatment as above.  Monitor. 3.  Thrombocytopenia: Resolved. 4.  Anemia: Patient's hemoglobin is slowly trending down is now 10.6.  Monitor. 5.  Hyponatremia: Chronic and unchanged.  Sodium is 130 today.  Patient expressed understanding and was in agreement with this plan. He also understands that He can call clinic at any time with any questions, concerns, or complaints.   Cancer Staging Bladder cancer [facility]) Staging form: Urinary Bladder, AJCC 8th Edition - Clinical stage from 12/25/2018: Stage II (cT2, cN0, cM0) - Signed by Lloyd Huger, MD on 12/25/2018   Lloyd Huger, MD   02/09/2019 1:22 PM

## 2019-02-08 ENCOUNTER — Inpatient Hospital Stay: Payer: Medicare Other

## 2019-02-08 ENCOUNTER — Inpatient Hospital Stay (HOSPITAL_BASED_OUTPATIENT_CLINIC_OR_DEPARTMENT_OTHER): Payer: Medicare Other | Admitting: Oncology

## 2019-02-08 ENCOUNTER — Other Ambulatory Visit: Payer: Self-pay

## 2019-02-08 ENCOUNTER — Encounter: Payer: Self-pay | Admitting: Oncology

## 2019-02-08 VITALS — BP 151/77 | HR 73 | Temp 98.1°F | Resp 18 | Wt 191.0 lb

## 2019-02-08 DIAGNOSIS — E871 Hypo-osmolality and hyponatremia: Secondary | ICD-10-CM

## 2019-02-08 DIAGNOSIS — Z95828 Presence of other vascular implants and grafts: Secondary | ICD-10-CM

## 2019-02-08 DIAGNOSIS — C678 Malignant neoplasm of overlapping sites of bladder: Secondary | ICD-10-CM

## 2019-02-08 DIAGNOSIS — D649 Anemia, unspecified: Secondary | ICD-10-CM

## 2019-02-08 DIAGNOSIS — Z79899 Other long term (current) drug therapy: Secondary | ICD-10-CM | POA: Diagnosis not present

## 2019-02-08 DIAGNOSIS — Z5111 Encounter for antineoplastic chemotherapy: Secondary | ICD-10-CM | POA: Diagnosis not present

## 2019-02-08 DIAGNOSIS — C679 Malignant neoplasm of bladder, unspecified: Secondary | ICD-10-CM

## 2019-02-08 LAB — COMPREHENSIVE METABOLIC PANEL
ALT: 31 U/L (ref 0–44)
AST: 28 U/L (ref 15–41)
Albumin: 3.7 g/dL (ref 3.5–5.0)
Alkaline Phosphatase: 64 U/L (ref 38–126)
Anion gap: 8 (ref 5–15)
BUN: 21 mg/dL (ref 8–23)
CO2: 26 mmol/L (ref 22–32)
Calcium: 8.7 mg/dL — ABNORMAL LOW (ref 8.9–10.3)
Chloride: 96 mmol/L — ABNORMAL LOW (ref 98–111)
Creatinine, Ser: 1.01 mg/dL (ref 0.61–1.24)
GFR calc Af Amer: >60 mL/min (ref >60)
GFR calc non Af Amer: >60 mL/min (ref >60)
GLUCOSE: 113 mg/dL — AB (ref 70–99)
Potassium: 3.9 mmol/L (ref 3.5–5.1)
Sodium: 130 mmol/L — ABNORMAL LOW (ref 135–145)
Total Bilirubin: 0.5 mg/dL (ref 0.3–1.2)
Total Protein: 6.9 g/dL (ref 6.5–8.1)

## 2019-02-08 LAB — CBC WITH DIFFERENTIAL/PLATELET
ABS IMMATURE GRANULOCYTES: 0.12 10*3/uL — AB (ref 0.00–0.07)
Basophils Absolute: 0.1 10*3/uL (ref 0.0–0.1)
Basophils Relative: 2 %
Eosinophils Absolute: 0.1 10*3/uL (ref 0.0–0.5)
Eosinophils Relative: 2 %
HEMATOCRIT: 30.4 % — AB (ref 39.0–52.0)
Hemoglobin: 10.6 g/dL — ABNORMAL LOW (ref 13.0–17.0)
Immature Granulocytes: 4 %
Lymphocytes Relative: 38 %
Lymphs Abs: 1.1 10*3/uL (ref 0.7–4.0)
MCH: 33.4 pg (ref 26.0–34.0)
MCHC: 34.9 g/dL (ref 30.0–36.0)
MCV: 95.9 fL (ref 80.0–100.0)
Monocytes Absolute: 0.3 10*3/uL (ref 0.1–1.0)
Monocytes Relative: 12 %
NEUTROS ABS: 1.1 10*3/uL — AB (ref 1.7–7.7)
Neutrophils Relative %: 42 %
Platelets: 203 10*3/uL (ref 150–400)
RBC: 3.17 MIL/uL — ABNORMAL LOW (ref 4.22–5.81)
RDW: 13.3 % (ref 11.5–15.5)
WBC: 2.8 10*3/uL — ABNORMAL LOW (ref 4.0–10.5)
nRBC: 0 % (ref 0.0–0.2)

## 2019-02-08 MED ORDER — SODIUM CHLORIDE 0.9 % IV SOLN
Freq: Once | INTRAVENOUS | Status: AC
  Administered 2019-02-08: 11:00:00 via INTRAVENOUS
  Filled 2019-02-08: qty 250

## 2019-02-08 MED ORDER — HEPARIN SOD (PORK) LOCK FLUSH 100 UNIT/ML IV SOLN
500.0000 [IU] | Freq: Once | INTRAVENOUS | Status: AC | PRN
  Administered 2019-02-08: 500 [IU]
  Filled 2019-02-08: qty 5

## 2019-02-08 MED ORDER — SODIUM CHLORIDE 0.9 % IV SOLN
2000.0000 mg | Freq: Once | INTRAVENOUS | Status: AC
  Administered 2019-02-08: 2000 mg via INTRAVENOUS
  Filled 2019-02-08: qty 52.6

## 2019-02-08 MED ORDER — PROCHLORPERAZINE MALEATE 10 MG PO TABS
10.0000 mg | ORAL_TABLET | Freq: Once | ORAL | Status: AC
  Administered 2019-02-08: 10 mg via ORAL
  Filled 2019-02-08: qty 1

## 2019-02-08 MED ORDER — SODIUM CHLORIDE 0.9% FLUSH
10.0000 mL | Freq: Once | INTRAVENOUS | Status: AC
  Administered 2019-02-08: 10 mL via INTRAVENOUS
  Filled 2019-02-08: qty 10

## 2019-02-08 NOTE — Progress Notes (Signed)
Patient denies any concerns today.  

## 2019-02-14 ENCOUNTER — Telehealth: Payer: Self-pay | Admitting: *Deleted

## 2019-02-14 ENCOUNTER — Inpatient Hospital Stay: Payer: Medicare Other | Admitting: Oncology

## 2019-02-14 ENCOUNTER — Other Ambulatory Visit: Payer: Self-pay

## 2019-02-14 NOTE — Telephone Encounter (Signed)
Per Dr [name] patient to see [facility] NP, Call returned to daughter who states the vein is above his port insertion site. She will have him head this way now.

## 2019-02-14 NOTE — Progress Notes (Unsigned)
Symptom Management Consult note [facility]  [phone] [phone]  Patient Care Team: Kirk Ruths, MD as PCP - General (Internal Medicine)   Name of the patient: Philip [name]  [id number]  Jul 06, 1941   Date of visit: 02/14/2019   Diagnosis- Stage   Chief complaint/ Reason for visit- ***  Heme/Onc history: ***  Interval history- ***   Denies any neurologic complaints. Denies recent fevers or illnesses. Denies any easy bleeding or bruising. Reports good appetite and denies weight loss. Denies chest pain. Denies any nausea, vomiting, constipation, or diarrhea. Denies urinary complaints. Patient offers no further specific complaints today.   ECOG FS:{CHL ONC OA:[id number]}  Review of systems- ROS   Current treatment- ***  No Known Allergies   Past Medical History:  Diagnosis Date  . Hypertension      Past Surgical History:  Procedure Laterality Date  . BACK SURGERY    . CATARACT EXTRACTION    . HERNIA REPAIR    . PORTA CATH INSERTION N/A 01/01/2019   Procedure: PORTA CATH INSERTION;  Surgeon: Algernon Huxley, MD;  Location: [place] [facility];  Service: Cardiovascular;  Laterality: N/A;  . TONSILLECTOMY    . TRANSURETHRAL RESECTION OF BLADDER TUMOR N/A 12/08/2018   Procedure: TRANSURETHRAL RESECTION OF BLADDER TUMOR (TURBT);  Surgeon: Billey Co, MD;  Location: [facility];  Service: Urology;  Laterality: N/A;    Social History   Socioeconomic History  . Marital status: Widowed    Spouse name: Not on file  . Number of children: 4  . Years of education: Not on file  . Highest education level: Not on file  Occupational History  . Not on file  Social Needs  . Financial resource strain: Not on file  . Food insecurity:    Worry: Not on file    Inability: Not on file  . Transportation needs:    Medical: Not on file    Non-medical: Not on file  Tobacco Use  . Smoking status: Former Smoker    Years:  35.00    Types: Cigarettes    Last attempt to quit: 11/29/1993    Years since quitting: 25.2  . Smokeless tobacco: Never Used  Substance and Sexual Activity  . Alcohol use: Yes    Comment: a couple of drinks a day all the above  . Drug use: Never  . Sexual activity: Not Currently    Partners: Female  Lifestyle  . Physical activity:    Days per week: Not on file    Minutes per session: Not on file  . Stress: Not on file  Relationships  . Social connections:    Talks on phone: Not on file    Gets together: Not on file    Attends religious service: Not on file    Active member of club or organization: Not on file    Attends meetings of clubs or organizations: Not on file    Relationship status: Not on file  . Intimate partner violence:    Fear of current or ex partner: Not on file    Emotionally abused: Not on file    Physically abused: Not on file    Forced sexual activity: Not on file  Other Topics Concern  . Not on file  Social History Narrative  . Not on file    No family history on file.   Current Outpatient Medications:  .  aspirin EC 81 MG tablet, Take by mouth.,  Disp: , Rfl:  .  atorvastatin (LIPITOR) 20 MG tablet, Take 20 mg by mouth daily., Disp: , Rfl:  .  lidocaine-prilocaine (EMLA) cream, Apply to affected area once, Disp: , Rfl:  .  lisinopril-hydrochlorothiazide (PRINZIDE,ZESTORETIC) 20-12.5 MG tablet, Take 1 tablet by mouth daily. , Disp: , Rfl:  .  Multiple Vitamins-Minerals (CENTRUM WOMEN) TABS, Take 1 tablet by mouth daily., Disp: , Rfl:  .  ondansetron (ZOFRAN) 8 MG tablet, Take 1 tablet (8 mg total) by mouth 2 (two) times daily as needed. Start on the third day after cisplatin chemotherapy., Disp: 30 tablet, Rfl: 2 .  prochlorperazine (COMPAZINE) 10 MG tablet, Take 1 tablet (10 mg total) by mouth every 6 (six) hours as needed (Nausea or vomiting)., Disp: 60 tablet, Rfl: 2 .  sodium fluoride (DENTAGEL) 1.1 % GEL dental gel, 1 ampule., Disp: , Rfl:  .   VITAMIN D PO, Take 2,000 Units by mouth once a week. , Disp: , Rfl:  .  Vitamin D, Ergocalciferol, (DRISDOL) 1.25 MG (50000 UT) CAPS capsule, Take by mouth., Disp: , Rfl:   Physical exam: There were no vitals filed for this visit. Physical Exam   CMP Latest Ref Rng & Units 02/08/2019  Glucose 70 - 99 mg/dL 113(H)  BUN 8 - 23 mg/dL 21  Creatinine 0.61 - 1.24 mg/dL 1.01  Sodium 135 - 145 mmol/L 130(L)  Potassium 3.5 - 5.1 mmol/L 3.9  Chloride 98 - 111 mmol/L 96(L)  CO2 22 - 32 mmol/L 26  Calcium 8.9 - 10.3 mg/dL 8.7(L)  Total Protein 6.5 - 8.1 g/dL 6.9  Total Bilirubin 0.3 - 1.2 mg/dL 0.5  Alkaline Phos 38 - 126 U/L 64  AST 15 - 41 U/L 28  ALT 0 - 44 U/L 31   CBC Latest Ref Rng & Units 02/08/2019  WBC 4.0 - 10.5 K/uL 2.8(L)  Hemoglobin 13.0 - 17.0 g/dL 10.6(L)  Hematocrit 39.0 - 52.0 % 30.4(L)  Platelets 150 - 400 K/uL 203    No images are attached to the encounter.  No results found.   Assessment and plan- Patient is a 78 y.o. male ***   Visit Diagnosis No diagnosis found.  Patient expressed understanding and was in agreement with this plan. He also understands that He can call clinic at any time with any questions, concerns, or complaints.   Greater than 50% was spent in counseling and coordination of care with this patient including but not limited to discussion of the relevant topics above (See A&P) including, but not limited to diagnosis and management of acute and chronic medical conditions.   Thank you for allowing me to participate in the care of this very pleasant patient.    Jacquelin Hawking, NP [place] [facility] Cell - [phone] Pager- [phone] 02/14/2019 2:32 PM

## 2019-02-14 NOTE — Telephone Encounter (Signed)
Call reporting that patient vein is swollen and sore. He had treatment on 3/12. Please advise

## 2019-02-21 ENCOUNTER — Other Ambulatory Visit: Payer: Self-pay

## 2019-02-22 ENCOUNTER — Encounter: Payer: Self-pay | Admitting: Oncology

## 2019-02-22 ENCOUNTER — Inpatient Hospital Stay: Payer: Medicare Other

## 2019-02-22 ENCOUNTER — Inpatient Hospital Stay (HOSPITAL_BASED_OUTPATIENT_CLINIC_OR_DEPARTMENT_OTHER): Payer: Medicare Other | Admitting: Oncology

## 2019-02-22 ENCOUNTER — Other Ambulatory Visit: Payer: Self-pay

## 2019-02-22 VITALS — BP 128/74 | Temp 97.9°F | Resp 16 | Wt 185.6 lb

## 2019-02-22 VITALS — BP 143/73 | HR 71 | Resp 20

## 2019-02-22 DIAGNOSIS — D649 Anemia, unspecified: Secondary | ICD-10-CM

## 2019-02-22 DIAGNOSIS — E871 Hypo-osmolality and hyponatremia: Secondary | ICD-10-CM

## 2019-02-22 DIAGNOSIS — C678 Malignant neoplasm of overlapping sites of bladder: Secondary | ICD-10-CM

## 2019-02-22 DIAGNOSIS — C679 Malignant neoplasm of bladder, unspecified: Secondary | ICD-10-CM

## 2019-02-22 DIAGNOSIS — Z79899 Other long term (current) drug therapy: Secondary | ICD-10-CM

## 2019-02-22 DIAGNOSIS — Z5111 Encounter for antineoplastic chemotherapy: Secondary | ICD-10-CM | POA: Diagnosis not present

## 2019-02-22 LAB — COMPREHENSIVE METABOLIC PANEL
ALT: 17 U/L (ref 0–44)
AST: 19 U/L (ref 15–41)
Albumin: 3.3 g/dL — ABNORMAL LOW (ref 3.5–5.0)
Alkaline Phosphatase: 66 U/L (ref 38–126)
Anion gap: 7 (ref 5–15)
BILIRUBIN TOTAL: 0.5 mg/dL (ref 0.3–1.2)
BUN: 16 mg/dL (ref 8–23)
CO2: 26 mmol/L (ref 22–32)
Calcium: 8.9 mg/dL (ref 8.9–10.3)
Chloride: 98 mmol/L (ref 98–111)
Creatinine, Ser: 0.88 mg/dL (ref 0.61–1.24)
GFR calc Af Amer: >60 mL/min (ref >60)
GFR calc non Af Amer: >60 mL/min (ref >60)
Glucose, Bld: 105 mg/dL — ABNORMAL HIGH (ref 70–99)
Potassium: 4.3 mmol/L (ref 3.5–5.1)
Sodium: 131 mmol/L — ABNORMAL LOW (ref 135–145)
TOTAL PROTEIN: 6.9 g/dL (ref 6.5–8.1)

## 2019-02-22 LAB — CBC WITH DIFFERENTIAL/PLATELET
Abs Immature Granulocytes: 0.27 10*3/uL — ABNORMAL HIGH (ref 0.00–0.07)
BASOS PCT: 1 %
Basophils Absolute: 0 10*3/uL (ref 0.0–0.1)
EOS ABS: 0.1 10*3/uL (ref 0.0–0.5)
Eosinophils Relative: 2 %
HCT: 28.6 % — ABNORMAL LOW (ref 39.0–52.0)
Hemoglobin: 9.8 g/dL — ABNORMAL LOW (ref 13.0–17.0)
Immature Granulocytes: 5 %
Lymphocytes Relative: 26 %
Lymphs Abs: 1.5 10*3/uL (ref 0.7–4.0)
MCH: 32.9 pg (ref 26.0–34.0)
MCHC: 34.3 g/dL (ref 30.0–36.0)
MCV: 96 fL (ref 80.0–100.0)
Monocytes Absolute: 1.3 10*3/uL — ABNORMAL HIGH (ref 0.1–1.0)
Monocytes Relative: 23 %
Neutro Abs: 2.6 10*3/uL (ref 1.7–7.7)
Neutrophils Relative %: 43 %
Platelets: 289 10*3/uL (ref 150–400)
RBC: 2.98 MIL/uL — ABNORMAL LOW (ref 4.22–5.81)
RDW: 14.1 % (ref 11.5–15.5)
WBC: 5.9 10*3/uL (ref 4.0–10.5)
nRBC: 0 % (ref 0.0–0.2)

## 2019-02-22 MED ORDER — HEPARIN SOD (PORK) LOCK FLUSH 100 UNIT/ML IV SOLN
500.0000 [IU] | Freq: Once | INTRAVENOUS | Status: AC | PRN
  Administered 2019-02-22: 500 [IU]

## 2019-02-22 MED ORDER — SODIUM CHLORIDE 0.9 % IV SOLN
70.0000 mg/m2 | Freq: Once | INTRAVENOUS | Status: AC
  Administered 2019-02-22: 142 mg via INTRAVENOUS
  Filled 2019-02-22: qty 100

## 2019-02-22 MED ORDER — SODIUM CHLORIDE 0.9 % IV SOLN
Freq: Once | INTRAVENOUS | Status: AC
  Administered 2019-02-22: 10:00:00 via INTRAVENOUS
  Filled 2019-02-22: qty 250

## 2019-02-22 MED ORDER — POTASSIUM CHLORIDE 2 MEQ/ML IV SOLN
Freq: Once | INTRAVENOUS | Status: AC
  Administered 2019-02-22: 10:00:00 via INTRAVENOUS
  Filled 2019-02-22: qty 1000

## 2019-02-22 MED ORDER — PALONOSETRON HCL INJECTION 0.25 MG/5ML
0.2500 mg | Freq: Once | INTRAVENOUS | Status: AC
  Administered 2019-02-22: 0.25 mg via INTRAVENOUS
  Filled 2019-02-22: qty 5

## 2019-02-22 MED ORDER — SODIUM CHLORIDE 0.9 % IV SOLN
Freq: Once | INTRAVENOUS | Status: AC
  Administered 2019-02-22: 12:00:00 via INTRAVENOUS
  Filled 2019-02-22: qty 5

## 2019-02-22 MED ORDER — SODIUM CHLORIDE 0.9 % IV SOLN
2000.0000 mg | Freq: Once | INTRAVENOUS | Status: AC
  Administered 2019-02-22: 2000 mg via INTRAVENOUS
  Filled 2019-02-22: qty 52.6

## 2019-02-22 NOTE — Progress Notes (Signed)
Patient here today for follow up and chemotherapy.  Patient states no new concerns today  

## 2019-02-22 NOTE — Progress Notes (Signed)
[place]  Telephone:(336) [phone] Fax:(336) [phone]  ID: Philip [name] OB: October 14, 1941  MR#: [id number]  JHE#:[id number]  Patient Care Team: Kirk Ruths, MD as PCP - General (Internal Medicine)  CHIEF COMPLAINT: Stage II invasive bladder cancer.  INTERVAL HISTORY: Patient returns to clinic today for further evaluation and consideration of cycle 3, day 1 of cisplatin and gemcitabine.  He currently feels well and is asymptomatic.  He is tolerating his treatments well without significant side effects. He has no neurologic complaints.  He denies any recent fevers or illnesses.  He has a good appetite and denies weight loss.  He has no chest pain or shortness of breath.  He denies any nausea, vomiting, constipation, or diarrhea.  He has no urinary complaints.  Patient offers no specific complaints today.  REVIEW OF SYSTEMS:   Review of Systems  Constitutional: Negative.  Negative for fever, malaise/fatigue and weight loss.  Respiratory: Negative.  Negative for cough, hemoptysis and shortness of breath.   Cardiovascular: Negative.  Negative for chest pain and leg swelling.  Gastrointestinal: Negative.  Negative for abdominal pain and constipation.  Genitourinary: Negative.  Negative for dysuria and hematuria.  Musculoskeletal: Negative.  Negative for back pain.  Skin: Negative.  Negative for rash.  Neurological: Negative.  Negative for focal weakness, weakness and headaches.  Psychiatric/Behavioral: Negative.  The patient is not nervous/anxious.     As per HPI. Otherwise, a complete review of systems is negative.  PAST MEDICAL HISTORY: Past Medical History:  Diagnosis Date  . Hypertension     PAST SURGICAL HISTORY: Past Surgical History:  Procedure Laterality Date  . BACK SURGERY    . CATARACT EXTRACTION    . HERNIA REPAIR    . PORTA CATH INSERTION N/A 01/01/2019   Procedure: PORTA CATH INSERTION;  Surgeon: Algernon Huxley, MD;  Location: [place] [facility];  Service: Cardiovascular;  Laterality: N/A;  . TONSILLECTOMY    . TRANSURETHRAL RESECTION OF BLADDER TUMOR N/A 12/08/2018   Procedure: TRANSURETHRAL RESECTION OF BLADDER TUMOR (TURBT);  Surgeon: Billey Co, MD;  Location: [facility];  Service: Urology;  Laterality: N/A;    FAMILY HISTORY: History reviewed. No pertinent family history.  ADVANCED DIRECTIVES (Y/N):  N  HEALTH MAINTENANCE: Social History   Tobacco Use  . Smoking status: Former Smoker    Years: 35.00    Types: Cigarettes    Last attempt to quit: 11/29/1993    Years since quitting: 25.2  . Smokeless tobacco: Never Used  Substance Use Topics  . Alcohol use: Yes    Comment: a couple of drinks a day all the above  . Drug use: Never     Colonoscopy:  PAP:  Bone density:  Lipid panel:  No Known Allergies  Current Outpatient Medications  Medication Sig Dispense Refill  . aspirin EC 81 MG tablet Take by mouth.    Marland Kitchen atorvastatin (LIPITOR) 20 MG tablet Take 20 mg by mouth daily.    Marland Kitchen lidocaine-prilocaine (EMLA) cream Apply to affected area once    . lisinopril-hydrochlorothiazide (PRINZIDE,ZESTORETIC) 20-12.5 MG tablet Take 1 tablet by mouth daily.     . Multiple Vitamins-Minerals (CENTRUM WOMEN) TABS Take 1 tablet by mouth daily.    . ondansetron (ZOFRAN) 8 MG tablet Take 1 tablet (8 mg total) by mouth 2 (two) times daily as needed. Start on the third day after cisplatin chemotherapy. 30 tablet 2  . prochlorperazine (COMPAZINE) 10 MG tablet Take 1 tablet (10 mg total) by mouth  every 6 (six) hours as needed (Nausea or vomiting). 60 tablet 2  . sodium fluoride (DENTAGEL) 1.1 % GEL dental gel 1 ampule.    Marland Kitchen VITAMIN D PO Take 2,000 Units by mouth once a week.     . Vitamin D, Ergocalciferol, (DRISDOL) 1.25 MG (50000 UT) CAPS capsule Take by mouth.     No current facility-administered medications for this visit.    Facility-Administered Medications Ordered in Other Visits  Medication Dose Route Frequency Provider  Last Rate Last Dose  . CISplatin (PLATINOL) 142 mg in sodium chloride 0.9 % 500 mL chemo infusion  70 mg/m2 (Treatment Plan Recorded) Intravenous Once Lloyd Huger, MD 642 mL/hr at 02/22/19 1507 142 mg at 02/22/19 1507  . heparin lock flush 100 unit/mL  500 Units Intracatheter Once PRN Lloyd Huger, MD        OBJECTIVE: Vitals:   02/22/19 0917  BP: 128/74  Resp: 16  Temp: 97.9 F (36.6 C)     Body mass index is 28.21 kg/m.    ECOG FS:0 - Asymptomatic  General: Well-developed, well-nourished, no acute distress. Eyes: Pink conjunctiva, anicteric sclera. HEENT: Normocephalic, moist mucous membranes. Lungs: Clear to auscultation bilaterally. Heart: Regular rate and rhythm. No rubs, murmurs, or gallops. Abdomen: Soft, nontender, nondistended. No organomegaly noted, normoactive bowel sounds. Musculoskeletal: No edema, cyanosis, or clubbing. Neuro: Alert, answering all questions appropriately. Cranial nerves grossly intact. Skin: No rashes or petechiae noted. Psych: Normal affect.  LAB RESULTS:  Lab Results  Component Value Date   NA 131 (L) 02/22/2019   K 4.3 02/22/2019   CL 98 02/22/2019   CO2 26 02/22/2019   GLUCOSE 105 (H) 02/22/2019   BUN 16 02/22/2019   CREATININE 0.88 02/22/2019   CALCIUM 8.9 02/22/2019   PROT 6.9 02/22/2019   ALBUMIN 3.3 (L) 02/22/2019   AST 19 02/22/2019   ALT 17 02/22/2019   ALKPHOS 66 02/22/2019   BILITOT 0.5 02/22/2019   GFRNONAA >60 02/22/2019   GFRAA >60 02/22/2019    Lab Results  Component Value Date   WBC 5.9 02/22/2019   NEUTROABS 2.6 02/22/2019   HGB 9.8 (L) 02/22/2019   HCT 28.6 (L) 02/22/2019   MCV 96.0 02/22/2019   PLT 289 02/22/2019     STUDIES: No results found.  ASSESSMENT: Stage II invasive bladder cancer  PLAN:   1.  Stage II invasive bladder cancer: Pathology and imaging results reviewed independently.  PET scan results from January 02, 2019 reviewed independently with no obvious metastatic disease.   Patient will receive neoadjuvant cisplatin and gemcitabine on day 1 with gemcitabine only on day 8.  This will be a 21-day cycle.  Proceed with cycle 3, day 1 of treatment today.  Return to clinic in 1 week for further evaluation and consideration of cycle 3, day 8 which is gemcitabine only.  Plan to do 4 cycles and then refer back to [facility] for consideration of surgery.  Patient states he has an appointment at [facility] approximately in June. 2.  Neutropenia: Resolved, monitor.   3.  Thrombocytopenia: Resolved. 4.  Anemia: Patient's hemoglobin continues to slowly trend down is now 9.8.  Monitor. 5.  Hyponatremia: Chronic and unchanged.  Sodium is 131 today.  Patient expressed understanding and was in agreement with this plan. He also understands that He can call clinic at any time with any questions, concerns, or complaints.   Cancer Staging Bladder cancer [facility]) Staging form: Urinary Bladder, AJCC 8th Edition - Clinical stage from 12/25/2018:  Stage II (cT2, cN0, cM0) - Signed by Lloyd Huger, MD on 12/25/2018   Lloyd Huger, MD   02/22/2019 3:31 PM

## 2019-02-28 ENCOUNTER — Other Ambulatory Visit: Payer: Self-pay

## 2019-03-01 ENCOUNTER — Inpatient Hospital Stay: Payer: Medicare Other | Attending: Oncology

## 2019-03-01 ENCOUNTER — Inpatient Hospital Stay (HOSPITAL_BASED_OUTPATIENT_CLINIC_OR_DEPARTMENT_OTHER): Payer: Medicare Other | Admitting: Oncology

## 2019-03-01 ENCOUNTER — Encounter: Payer: Self-pay | Admitting: Oncology

## 2019-03-01 ENCOUNTER — Other Ambulatory Visit: Payer: Self-pay

## 2019-03-01 ENCOUNTER — Inpatient Hospital Stay: Payer: Medicare Other

## 2019-03-01 VITALS — BP 150/87 | HR 61 | Temp 97.9°F | Ht 68.0 in | Wt 186.0 lb

## 2019-03-01 DIAGNOSIS — E871 Hypo-osmolality and hyponatremia: Secondary | ICD-10-CM | POA: Insufficient documentation

## 2019-03-01 DIAGNOSIS — Z5111 Encounter for antineoplastic chemotherapy: Secondary | ICD-10-CM | POA: Insufficient documentation

## 2019-03-01 DIAGNOSIS — Z87891 Personal history of nicotine dependence: Secondary | ICD-10-CM | POA: Insufficient documentation

## 2019-03-01 DIAGNOSIS — N179 Acute kidney failure, unspecified: Secondary | ICD-10-CM | POA: Diagnosis not present

## 2019-03-01 DIAGNOSIS — T451X5A Adverse effect of antineoplastic and immunosuppressive drugs, initial encounter: Secondary | ICD-10-CM | POA: Insufficient documentation

## 2019-03-01 DIAGNOSIS — C679 Malignant neoplasm of bladder, unspecified: Secondary | ICD-10-CM

## 2019-03-01 DIAGNOSIS — Z7982 Long term (current) use of aspirin: Secondary | ICD-10-CM | POA: Insufficient documentation

## 2019-03-01 DIAGNOSIS — D649 Anemia, unspecified: Secondary | ICD-10-CM | POA: Diagnosis not present

## 2019-03-01 DIAGNOSIS — C678 Malignant neoplasm of overlapping sites of bladder: Secondary | ICD-10-CM | POA: Diagnosis not present

## 2019-03-01 DIAGNOSIS — Z79899 Other long term (current) drug therapy: Secondary | ICD-10-CM | POA: Diagnosis not present

## 2019-03-01 DIAGNOSIS — D701 Agranulocytosis secondary to cancer chemotherapy: Secondary | ICD-10-CM | POA: Diagnosis not present

## 2019-03-01 DIAGNOSIS — I10 Essential (primary) hypertension: Secondary | ICD-10-CM | POA: Insufficient documentation

## 2019-03-01 DIAGNOSIS — Z95828 Presence of other vascular implants and grafts: Secondary | ICD-10-CM

## 2019-03-01 LAB — CBC WITH DIFFERENTIAL/PLATELET
Abs Immature Granulocytes: 0.08 10*3/uL — ABNORMAL HIGH (ref 0.00–0.07)
Basophils Absolute: 0 10*3/uL (ref 0.0–0.1)
Basophils Relative: 1 %
Eosinophils Absolute: 0 10*3/uL (ref 0.0–0.5)
Eosinophils Relative: 1 %
HCT: 27.1 % — ABNORMAL LOW (ref 39.0–52.0)
Hemoglobin: 9.3 g/dL — ABNORMAL LOW (ref 13.0–17.0)
Immature Granulocytes: 3 %
Lymphocytes Relative: 35 %
Lymphs Abs: 1.1 10*3/uL (ref 0.7–4.0)
MCH: 32.9 pg (ref 26.0–34.0)
MCHC: 34.3 g/dL (ref 30.0–36.0)
MCV: 95.8 fL (ref 80.0–100.0)
Monocytes Absolute: 0.3 10*3/uL (ref 0.1–1.0)
Monocytes Relative: 10 %
Neutro Abs: 1.5 10*3/uL — ABNORMAL LOW (ref 1.7–7.7)
Neutrophils Relative %: 50 %
Platelets: 214 10*3/uL (ref 150–400)
RBC: 2.83 MIL/uL — ABNORMAL LOW (ref 4.22–5.81)
RDW: 13.6 % (ref 11.5–15.5)
WBC: 3.1 10*3/uL — ABNORMAL LOW (ref 4.0–10.5)
nRBC: 0 % (ref 0.0–0.2)

## 2019-03-01 LAB — COMPREHENSIVE METABOLIC PANEL
ALT: 23 U/L (ref 0–44)
AST: 28 U/L (ref 15–41)
Albumin: 3.4 g/dL — ABNORMAL LOW (ref 3.5–5.0)
Alkaline Phosphatase: 67 U/L (ref 38–126)
Anion gap: 8 (ref 5–15)
BUN: 24 mg/dL — ABNORMAL HIGH (ref 8–23)
CO2: 27 mmol/L (ref 22–32)
Calcium: 8.5 mg/dL — ABNORMAL LOW (ref 8.9–10.3)
Chloride: 94 mmol/L — ABNORMAL LOW (ref 98–111)
Creatinine, Ser: 1.18 mg/dL (ref 0.61–1.24)
GFR calc Af Amer: >60 mL/min (ref >60)
GFR calc non Af Amer: 58 mL/min — ABNORMAL LOW (ref >60)
Glucose, Bld: 106 mg/dL — ABNORMAL HIGH (ref 70–99)
Potassium: 3.8 mmol/L (ref 3.5–5.1)
Sodium: 129 mmol/L — ABNORMAL LOW (ref 135–145)
Total Bilirubin: 0.5 mg/dL (ref 0.3–1.2)
Total Protein: 6.8 g/dL (ref 6.5–8.1)

## 2019-03-01 MED ORDER — SODIUM CHLORIDE 0.9% FLUSH
10.0000 mL | Freq: Once | INTRAVENOUS | Status: AC
  Administered 2019-03-01: 10 mL via INTRAVENOUS
  Filled 2019-03-01: qty 10

## 2019-03-01 MED ORDER — HEPARIN SOD (PORK) LOCK FLUSH 100 UNIT/ML IV SOLN
500.0000 [IU] | Freq: Once | INTRAVENOUS | Status: AC
  Administered 2019-03-01: 11:00:00 500 [IU] via INTRAVENOUS
  Filled 2019-03-01: qty 5

## 2019-03-01 MED ORDER — SODIUM CHLORIDE 0.9 % IV SOLN
Freq: Once | INTRAVENOUS | Status: AC
  Administered 2019-03-01: 10:00:00 via INTRAVENOUS
  Filled 2019-03-01: qty 250

## 2019-03-01 MED ORDER — PROCHLORPERAZINE MALEATE 10 MG PO TABS
10.0000 mg | ORAL_TABLET | Freq: Once | ORAL | Status: AC
  Administered 2019-03-01: 10:00:00 10 mg via ORAL
  Filled 2019-03-01: qty 1

## 2019-03-01 MED ORDER — SODIUM CHLORIDE 0.9 % IV SOLN
2000.0000 mg | Freq: Once | INTRAVENOUS | Status: AC
  Administered 2019-03-01: 10:00:00 2000 mg via INTRAVENOUS
  Filled 2019-03-01: qty 52.6

## 2019-03-01 NOTE — Progress Notes (Signed)
[place]  [phone] [phone]  ID: [name] OB: 08/06/1941  MR#: [id number]  OBS#:[id number]  Patient Care Team: Kirk Ruths, MD as PCP - General (Internal Medicine)  CHIEF COMPLAINT: Stage II invasive bladder cancer.  INTERVAL HISTORY: Patient returns to clinic today for further evaluation and consideration of cycle 3, day 8 of cisplatin and gemcitabine.  Gemcitabine only.  He continues to feel well and remains asymptomatic. He is tolerating his treatments well without significant side effects. He has no neurologic complaints.  He denies any recent fevers or illnesses.  He has a good appetite and denies weight loss.  He denies any chest pain, cough, shortness of breath, or hemoptysis.  He denies any nausea, vomiting, constipation, or diarrhea.  He has no urinary complaints.  Patient feels at his baseline offers no specific complaints today.    REVIEW OF SYSTEMS:   Review of Systems  Constitutional: Negative.  Negative for fever, malaise/fatigue and weight loss.  Respiratory: Negative.  Negative for cough, hemoptysis and shortness of breath.   Cardiovascular: Negative.  Negative for chest pain and leg swelling.  Gastrointestinal: Negative.  Negative for abdominal pain and constipation.  Genitourinary: Negative.  Negative for dysuria and hematuria.  Musculoskeletal: Negative.  Negative for back pain.  Skin: Negative.  Negative for rash.  Neurological: Negative.  Negative for focal weakness, weakness and headaches.  Psychiatric/Behavioral: Negative.  The patient is not nervous/anxious.     As per HPI. Otherwise, a complete review of systems is negative.  PAST MEDICAL HISTORY: Past Medical History:  Diagnosis Date  . Hypertension     PAST SURGICAL HISTORY: Past Surgical History:  Procedure Laterality Date  . BACK SURGERY    . CATARACT EXTRACTION    . HERNIA REPAIR    . PORTA CATH INSERTION N/A 01/01/2019   Procedure: PORTA  CATH INSERTION;  Surgeon: Algernon Huxley, MD;  Location: [place] [facility];  Service: Cardiovascular;  Laterality: N/A;  . TONSILLECTOMY    . TRANSURETHRAL RESECTION OF BLADDER TUMOR N/A 12/08/2018   Procedure: TRANSURETHRAL RESECTION OF BLADDER TUMOR (TURBT);  Surgeon: Billey Co, MD;  Location: [facility];  Service: Urology;  Laterality: N/A;    FAMILY HISTORY: History reviewed. No pertinent family history.  ADVANCED DIRECTIVES (Y/N):  N  HEALTH MAINTENANCE: Social History   Tobacco Use  . Smoking status: Former Smoker    Years: 35.00    Types: Cigarettes    Last attempt to quit: 11/29/1993    Years since quitting: 25.2  . Smokeless tobacco: Never Used  Substance Use Topics  . Alcohol use: Yes    Comment: a couple of drinks a day all the above  . Drug use: Never     Colonoscopy:  PAP:  Bone density:  Lipid panel:  No Known Allergies  Current Outpatient Medications  Medication Sig Dispense Refill  . aspirin EC 81 MG tablet Take by mouth.    Marland Kitchen atorvastatin (LIPITOR) 20 MG tablet Take 20 mg by mouth daily.    Marland Kitchen lidocaine-prilocaine (EMLA) cream Apply to affected area once    . lisinopril-hydrochlorothiazide (PRINZIDE,ZESTORETIC) 20-12.5 MG tablet Take 1 tablet by mouth daily.     . Multiple Vitamins-Minerals (CENTRUM WOMEN) TABS Take 1 tablet by mouth daily.    . ondansetron (ZOFRAN) 8 MG tablet Take 1 tablet (8 mg total) by mouth 2 (two) times daily as needed. Start on the third day after cisplatin chemotherapy. 30 tablet 2  . prochlorperazine (COMPAZINE)  10 MG tablet Take 1 tablet (10 mg total) by mouth every 6 (six) hours as needed (Nausea or vomiting). 60 tablet 2  . sodium fluoride (DENTAGEL) 1.1 % GEL dental gel 1 ampule.    Marland Kitchen VITAMIN D PO Take 2,000 Units by mouth once a week.     . Vitamin D, Ergocalciferol, (DRISDOL) 1.25 MG (50000 UT) CAPS capsule Take by mouth.     No current facility-administered medications for this visit.     OBJECTIVE: Vitals:    03/01/19 0926  BP: (!) 150/87  Pulse: 61  Temp: 97.9 F (36.6 C)     Body mass index is 28.28 kg/m.    ECOG FS:0 - Asymptomatic  General: Well-developed, well-nourished, no acute distress. Eyes: Pink conjunctiva, anicteric sclera. HEENT: Normocephalic, moist mucous membranes. Lungs: Clear to auscultation bilaterally. Heart: Regular rate and rhythm. No rubs, murmurs, or gallops. Abdomen: Soft, nontender, nondistended. No organomegaly noted, normoactive bowel sounds. Musculoskeletal: No edema, cyanosis, or clubbing. Neuro: Alert, answering all questions appropriately. Cranial nerves grossly intact. Skin: No rashes or petechiae noted. Psych: Normal affect.  LAB RESULTS:  Lab Results  Component Value Date   NA 129 (L) 03/01/2019   K 3.8 03/01/2019   CL 94 (L) 03/01/2019   CO2 27 03/01/2019   GLUCOSE 106 (H) 03/01/2019   BUN 24 (H) 03/01/2019   CREATININE 1.18 03/01/2019   CALCIUM 8.5 (L) 03/01/2019   PROT 6.8 03/01/2019   ALBUMIN 3.4 (L) 03/01/2019   AST 28 03/01/2019   ALT 23 03/01/2019   ALKPHOS 67 03/01/2019   BILITOT 0.5 03/01/2019   GFRNONAA 58 (L) 03/01/2019   GFRAA >60 03/01/2019    Lab Results  Component Value Date   WBC 3.1 (L) 03/01/2019   NEUTROABS 1.5 (L) 03/01/2019   HGB 9.3 (L) 03/01/2019   HCT 27.1 (L) 03/01/2019   MCV 95.8 03/01/2019   PLT 214 03/01/2019     STUDIES: No results found.  ASSESSMENT: Stage II invasive bladder cancer  PLAN:   1.  Stage II invasive bladder cancer: Pathology and imaging results reviewed independently.  PET scan results from January 02, 2019 reviewed independently with no obvious metastatic disease.  Patient will receive neoadjuvant cisplatin and gemcitabine on day 1 with gemcitabine only on day 8.  This will be a 21-day cycle.  Proceed with cycle 3, day 8 today, gemcitabine only. Return to clinic in 2 weeks for further evaluation and consideration of cycle 4, day 1.  Plan to do 4 cycles and then refer back to [facility] for  consideration of surgery.  Patient states he has an appointment at [facility] approximately in June. 2.  Neutropenia: Mild, monitor.   3.  Thrombocytopenia: Resolved. 4.  Anemia: Patient's hemoglobin continues to slowly trend down is now 9.3.  Monitor. 5.  Hyponatremia: Chronic and unchanged.  Sodium is 129 today.  Patient expressed understanding and was in agreement with this plan. He also understands that He can call clinic at any time with any questions, concerns, or complaints.   Cancer Staging Bladder cancer [facility]) Staging form: Urinary Bladder, AJCC 8th Edition - Clinical stage from 12/25/2018: Stage II (cT2, cN0, cM0) - Signed by Lloyd Huger, MD on 12/25/2018   Lloyd Huger, MD   03/02/2019 1:02 PM

## 2019-03-01 NOTE — Progress Notes (Signed)
Patient stated that he had been doing well with no complaints. 

## 2019-03-14 ENCOUNTER — Other Ambulatory Visit: Payer: Self-pay

## 2019-03-15 ENCOUNTER — Inpatient Hospital Stay: Payer: Medicare Other

## 2019-03-15 ENCOUNTER — Encounter (INDEPENDENT_AMBULATORY_CARE_PROVIDER_SITE_OTHER): Payer: Self-pay

## 2019-03-15 ENCOUNTER — Other Ambulatory Visit: Payer: Self-pay

## 2019-03-15 ENCOUNTER — Inpatient Hospital Stay (HOSPITAL_BASED_OUTPATIENT_CLINIC_OR_DEPARTMENT_OTHER): Payer: Medicare Other | Admitting: Oncology

## 2019-03-15 ENCOUNTER — Encounter: Payer: Self-pay | Admitting: Oncology

## 2019-03-15 VITALS — BP 145/72 | HR 82 | Temp 98.1°F | Ht 68.0 in | Wt 188.0 lb

## 2019-03-15 DIAGNOSIS — D649 Anemia, unspecified: Secondary | ICD-10-CM

## 2019-03-15 DIAGNOSIS — E871 Hypo-osmolality and hyponatremia: Secondary | ICD-10-CM

## 2019-03-15 DIAGNOSIS — Z5111 Encounter for antineoplastic chemotherapy: Secondary | ICD-10-CM | POA: Diagnosis not present

## 2019-03-15 DIAGNOSIS — C678 Malignant neoplasm of overlapping sites of bladder: Secondary | ICD-10-CM

## 2019-03-15 DIAGNOSIS — Z79899 Other long term (current) drug therapy: Secondary | ICD-10-CM

## 2019-03-15 DIAGNOSIS — C679 Malignant neoplasm of bladder, unspecified: Secondary | ICD-10-CM

## 2019-03-15 LAB — CBC WITH DIFFERENTIAL/PLATELET
Abs Immature Granulocytes: 0.06 10*3/uL (ref 0.00–0.07)
Basophils Absolute: 0 10*3/uL (ref 0.0–0.1)
Basophils Relative: 1 %
Eosinophils Absolute: 0.2 10*3/uL (ref 0.0–0.5)
Eosinophils Relative: 5 %
HCT: 25.1 % — ABNORMAL LOW (ref 39.0–52.0)
Hemoglobin: 8.6 g/dL — ABNORMAL LOW (ref 13.0–17.0)
Immature Granulocytes: 1 %
Lymphocytes Relative: 31 %
Lymphs Abs: 1.3 10*3/uL (ref 0.7–4.0)
MCH: 33.9 pg (ref 26.0–34.0)
MCHC: 34.3 g/dL (ref 30.0–36.0)
MCV: 98.8 fL (ref 80.0–100.0)
Monocytes Absolute: 1 10*3/uL (ref 0.1–1.0)
Monocytes Relative: 24 %
Neutro Abs: 1.6 10*3/uL — ABNORMAL LOW (ref 1.7–7.7)
Neutrophils Relative %: 38 %
Platelets: 332 10*3/uL (ref 150–400)
RBC: 2.54 MIL/uL — ABNORMAL LOW (ref 4.22–5.81)
RDW: 16.6 % — ABNORMAL HIGH (ref 11.5–15.5)
WBC: 4.2 10*3/uL (ref 4.0–10.5)
nRBC: 0 % (ref 0.0–0.2)

## 2019-03-15 LAB — COMPREHENSIVE METABOLIC PANEL
ALT: 15 U/L (ref 0–44)
AST: 19 U/L (ref 15–41)
Albumin: 3.6 g/dL (ref 3.5–5.0)
Alkaline Phosphatase: 68 U/L (ref 38–126)
Anion gap: 5 (ref 5–15)
BUN: 21 mg/dL (ref 8–23)
CO2: 27 mmol/L (ref 22–32)
Calcium: 8.8 mg/dL — ABNORMAL LOW (ref 8.9–10.3)
Chloride: 99 mmol/L (ref 98–111)
Creatinine, Ser: 1.07 mg/dL (ref 0.61–1.24)
GFR calc Af Amer: >60 mL/min (ref >60)
GFR calc non Af Amer: >60 mL/min (ref >60)
Glucose, Bld: 102 mg/dL — ABNORMAL HIGH (ref 70–99)
Potassium: 4.1 mmol/L (ref 3.5–5.1)
Sodium: 131 mmol/L — ABNORMAL LOW (ref 135–145)
Total Bilirubin: 0.3 mg/dL (ref 0.3–1.2)
Total Protein: 6.8 g/dL (ref 6.5–8.1)

## 2019-03-15 MED ORDER — SODIUM CHLORIDE 0.9 % IV SOLN
2000.0000 mg | Freq: Once | INTRAVENOUS | Status: AC
  Administered 2019-03-15: 2000 mg via INTRAVENOUS
  Filled 2019-03-15: qty 52.6

## 2019-03-15 MED ORDER — SODIUM CHLORIDE 0.9 % IV SOLN
Freq: Once | INTRAVENOUS | Status: AC
  Administered 2019-03-15: 10:00:00 via INTRAVENOUS
  Filled 2019-03-15: qty 250

## 2019-03-15 MED ORDER — POTASSIUM CHLORIDE 2 MEQ/ML IV SOLN
Freq: Once | INTRAVENOUS | Status: AC
  Administered 2019-03-15: 10:00:00 via INTRAVENOUS
  Filled 2019-03-15: qty 10

## 2019-03-15 MED ORDER — SODIUM CHLORIDE 0.9 % IV SOLN
Freq: Once | INTRAVENOUS | Status: AC
  Administered 2019-03-15: 12:00:00 via INTRAVENOUS
  Filled 2019-03-15: qty 5

## 2019-03-15 MED ORDER — SODIUM CHLORIDE 0.9% FLUSH
10.0000 mL | INTRAVENOUS | Status: DC | PRN
  Administered 2019-03-15: 09:00:00 10 mL via INTRAVENOUS
  Filled 2019-03-15: qty 10

## 2019-03-15 MED ORDER — HEPARIN SOD (PORK) LOCK FLUSH 100 UNIT/ML IV SOLN
500.0000 [IU] | Freq: Once | INTRAVENOUS | Status: AC
  Administered 2019-03-15: 500 [IU] via INTRAVENOUS
  Filled 2019-03-15: qty 5

## 2019-03-15 MED ORDER — PALONOSETRON HCL INJECTION 0.25 MG/5ML
0.2500 mg | Freq: Once | INTRAVENOUS | Status: AC
  Administered 2019-03-15: 12:00:00 0.25 mg via INTRAVENOUS
  Filled 2019-03-15: qty 5

## 2019-03-15 MED ORDER — SODIUM CHLORIDE 0.9 % IV SOLN
70.0000 mg/m2 | Freq: Once | INTRAVENOUS | Status: AC
  Administered 2019-03-15: 14:00:00 142 mg via INTRAVENOUS
  Filled 2019-03-15: qty 100

## 2019-03-15 NOTE — Progress Notes (Signed)
Patient stated that he had been doing well with no complaints. 

## 2019-03-15 NOTE — Progress Notes (Signed)
Pt unable to urinate more than 100cc at this time. Dr. [name] aware and per Dr. [name] okay to proceed with treatment, pt to continue to measure urine output, pt aware and verbalizes understanding.

## 2019-03-15 NOTE — Progress Notes (Signed)
[place]  [phone]) [phone] [phone]) [phone]  ID: Philip [name] OB: Oct 02, 1941  MR#: [id number]  EHU#:[id number]  Patient Care Team: Kirk Ruths, MD as PCP - General (Internal Medicine)  CHIEF COMPLAINT: Stage II invasive bladder cancer.  INTERVAL HISTORY: Patient returns to clinic today for further evaluation and consideration of cycle 4, day 1 of cisplatin and gemcitabine.  He continues to tolerate his treatments well without significant side effects.  He currently feels well and is asymptomatic. He has no neurologic complaints.  He denies any recent fevers or illnesses.  He has a good appetite and denies weight loss.  He denies any chest pain, cough, shortness of breath, or hemoptysis.  He denies any nausea, vomiting, constipation, or diarrhea.  He has no urinary complaints.  Patient offers no specific complaints today.  REVIEW OF SYSTEMS:   Review of Systems  Constitutional: Negative.  Negative for fever, malaise/fatigue and weight loss.  Respiratory: Negative.  Negative for cough, hemoptysis and shortness of breath.   Cardiovascular: Negative.  Negative for chest pain and leg swelling.  Gastrointestinal: Negative.  Negative for abdominal pain and constipation.  Genitourinary: Negative.  Negative for dysuria and hematuria.  Musculoskeletal: Negative.  Negative for back pain.  Skin: Negative.  Negative for rash.  Neurological: Negative.  Negative for focal weakness, weakness and headaches.  Psychiatric/Behavioral: Negative.  The patient is not nervous/anxious.     As per HPI. Otherwise, a complete review of systems is negative.  PAST MEDICAL HISTORY: Past Medical History:  Diagnosis Date  . Hypertension     PAST SURGICAL HISTORY: Past Surgical History:  Procedure Laterality Date  . BACK SURGERY    . CATARACT EXTRACTION    . HERNIA REPAIR    . PORTA CATH INSERTION N/A 01/01/2019   Procedure: PORTA CATH INSERTION;  Surgeon: Algernon Huxley,  MD;  Location: [place] [facility];  Service: Cardiovascular;  Laterality: N/A;  . TONSILLECTOMY    . TRANSURETHRAL RESECTION OF BLADDER TUMOR N/A 12/08/2018   Procedure: TRANSURETHRAL RESECTION OF BLADDER TUMOR (TURBT);  Surgeon: Billey Co, MD;  Location: [facility];  Service: Urology;  Laterality: N/A;    FAMILY HISTORY: History reviewed. No pertinent family history.  ADVANCED DIRECTIVES (Y/N):  N  HEALTH MAINTENANCE: Social History   Tobacco Use  . Smoking status: Former Smoker    Years: 35.00    Types: Cigarettes    Last attempt to quit: 11/29/1993    Years since quitting: 25.3  . Smokeless tobacco: Never Used  Substance Use Topics  . Alcohol use: Yes    Comment: a couple of drinks a day all the above  . Drug use: Never     Colonoscopy:  PAP:  Bone density:  Lipid panel:  No Known Allergies  Current Outpatient Medications  Medication Sig Dispense Refill  . aspirin EC 81 MG tablet Take by mouth.    Marland Kitchen atorvastatin (LIPITOR) 20 MG tablet Take 20 mg by mouth daily.    Marland Kitchen lidocaine-prilocaine (EMLA) cream Apply to affected area once    . lisinopril-hydrochlorothiazide (PRINZIDE,ZESTORETIC) 20-12.5 MG tablet Take 1 tablet by mouth daily.     . Multiple Vitamins-Minerals (CENTRUM WOMEN) TABS Take 1 tablet by mouth daily.    . ondansetron (ZOFRAN) 8 MG tablet Take 1 tablet (8 mg total) by mouth 2 (two) times daily as needed. Start on the third day after cisplatin chemotherapy. 30 tablet 2  . prochlorperazine (COMPAZINE) 10 MG tablet Take 1 tablet (10 mg  total) by mouth every 6 (six) hours as needed (Nausea or vomiting). 60 tablet 2  . sodium fluoride (DENTAGEL) 1.1 % GEL dental gel 1 ampule.    Marland Kitchen VITAMIN D PO Take 2,000 Units by mouth once a week.     . Vitamin D, Ergocalciferol, (DRISDOL) 1.25 MG (50000 UT) CAPS capsule Take by mouth.     No current facility-administered medications for this visit.    Facility-Administered Medications Ordered in Other Visits   Medication Dose Route Frequency Provider Last Rate Last Dose  . CISplatin (PLATINOL) 142 mg in sodium chloride 0.9 % 500 mL chemo infusion  70 mg/m2 (Treatment Plan Recorded) Intravenous Once Lloyd Huger, MD      . fosaprepitant (EMEND) 150 mg, dexamethasone (DECADRON) 12 mg in sodium chloride 0.9 % 145 mL IVPB   Intravenous Once Lloyd Huger, MD      . gemcitabine (GEMZAR) 2,000 mg in sodium chloride 0.9 % 250 mL chemo infusion  2,000 mg Intravenous Once Lloyd Huger, MD      . heparin lock flush 100 unit/mL  500 Units Intravenous Once Lloyd Huger, MD      . palonosetron (ALOXI) injection 0.25 mg  0.25 mg Intravenous Once Lloyd Huger, MD      . sodium chloride flush (NS) 0.9 % injection 10 mL  10 mL Intravenous PRN Lloyd Huger, MD   10 mL at 03/15/19 0844    OBJECTIVE: Vitals:   03/15/19 0906  BP: (!) 145/72  Pulse: 82  Temp: 98.1 F (36.7 C)     Body mass index is 28.59 kg/m.    ECOG FS:0 - Asymptomatic  General: Well-developed, well-nourished, no acute distress. Eyes: Pink conjunctiva, anicteric sclera. HEENT: Normocephalic, moist mucous membranes. Lungs: Clear to auscultation bilaterally. Heart: Regular rate and rhythm. No rubs, murmurs, or gallops. Abdomen: Soft, nontender, nondistended. No organomegaly noted, normoactive bowel sounds. Musculoskeletal: No edema, cyanosis, or clubbing. Neuro: Alert, answering all questions appropriately. Cranial nerves grossly intact. Skin: No rashes or petechiae noted. Psych: Normal affect.  LAB RESULTS:  Lab Results  Component Value Date   NA 131 (L) 03/15/2019   K 4.1 03/15/2019   CL 99 03/15/2019   CO2 27 03/15/2019   GLUCOSE 102 (H) 03/15/2019   BUN 21 03/15/2019   CREATININE 1.07 03/15/2019   CALCIUM 8.8 (L) 03/15/2019   PROT 6.8 03/15/2019   ALBUMIN 3.6 03/15/2019   AST 19 03/15/2019   ALT 15 03/15/2019   ALKPHOS 68 03/15/2019   BILITOT 0.3 03/15/2019   GFRNONAA >60 03/15/2019    GFRAA >60 03/15/2019    Lab Results  Component Value Date   WBC 4.2 03/15/2019   NEUTROABS 1.6 (L) 03/15/2019   HGB 8.6 (L) 03/15/2019   HCT 25.1 (L) 03/15/2019   MCV 98.8 03/15/2019   PLT 332 03/15/2019     STUDIES: No results found.  ASSESSMENT: Stage II invasive bladder cancer  PLAN:   1.  Stage II invasive bladder cancer: Pathology and imaging results reviewed independently.  PET scan results from January 02, 2019 reviewed independently with no obvious metastatic disease.  Patient will receive neoadjuvant cisplatin and gemcitabine on day 1 with gemcitabine only on day 8.  This will be a 21-day cycle.  Proceed with cycle 4, day 1 today.  Return to clinic in 1 week for further evaluation and consideration of cycle 4, day 8.  Plan to do 4 cycles and then refer back to [facility] for consideration of  surgery.  Patient states he has an appointment at [facility] approximately in June, but plans to call to see if this appointment can be moved up. 2.  Neutropenia: Resolved. 3.  Thrombocytopenia: Resolved. 4.  Anemia: Patient's hemoglobin continues to slowly trend down and is now 8.6.  Monitor. 5.  Hyponatremia: Chronic and unchanged.  Sodium is 131 today.  Patient expressed understanding and was in agreement with this plan. He also understands that He can call clinic at any time with any questions, concerns, or complaints.   Cancer Staging Bladder cancer [facility]) Staging form: Urinary Bladder, AJCC 8th Edition - Clinical stage from 12/25/2018: Stage II (cT2, cN0, cM0) - Signed by Lloyd Huger, MD on 12/25/2018   Lloyd Huger, MD   03/15/2019 12:11 PM

## 2019-03-21 ENCOUNTER — Other Ambulatory Visit: Payer: Self-pay

## 2019-03-22 ENCOUNTER — Inpatient Hospital Stay (HOSPITAL_BASED_OUTPATIENT_CLINIC_OR_DEPARTMENT_OTHER): Payer: Medicare Other | Admitting: Oncology

## 2019-03-22 ENCOUNTER — Inpatient Hospital Stay: Payer: Medicare Other

## 2019-03-22 ENCOUNTER — Other Ambulatory Visit: Payer: Self-pay

## 2019-03-22 ENCOUNTER — Encounter: Payer: Self-pay | Admitting: Oncology

## 2019-03-22 VITALS — BP 129/66 | HR 75 | Temp 97.1°F | Resp 18 | Wt 188.4 lb

## 2019-03-22 DIAGNOSIS — C678 Malignant neoplasm of overlapping sites of bladder: Secondary | ICD-10-CM | POA: Diagnosis not present

## 2019-03-22 DIAGNOSIS — D701 Agranulocytosis secondary to cancer chemotherapy: Secondary | ICD-10-CM

## 2019-03-22 DIAGNOSIS — E871 Hypo-osmolality and hyponatremia: Secondary | ICD-10-CM

## 2019-03-22 DIAGNOSIS — D6481 Anemia due to antineoplastic chemotherapy: Secondary | ICD-10-CM

## 2019-03-22 DIAGNOSIS — Z79899 Other long term (current) drug therapy: Secondary | ICD-10-CM

## 2019-03-22 DIAGNOSIS — T451X5A Adverse effect of antineoplastic and immunosuppressive drugs, initial encounter: Secondary | ICD-10-CM

## 2019-03-22 DIAGNOSIS — Z5111 Encounter for antineoplastic chemotherapy: Secondary | ICD-10-CM | POA: Diagnosis not present

## 2019-03-22 DIAGNOSIS — N179 Acute kidney failure, unspecified: Secondary | ICD-10-CM | POA: Insufficient documentation

## 2019-03-22 DIAGNOSIS — D649 Anemia, unspecified: Secondary | ICD-10-CM

## 2019-03-22 DIAGNOSIS — Z95828 Presence of other vascular implants and grafts: Secondary | ICD-10-CM

## 2019-03-22 LAB — CBC WITH DIFFERENTIAL/PLATELET
Abs Immature Granulocytes: 0.05 10*3/uL (ref 0.00–0.07)
Basophils Absolute: 0 10*3/uL (ref 0.0–0.1)
Basophils Relative: 1 %
Eosinophils Absolute: 0 10*3/uL (ref 0.0–0.5)
Eosinophils Relative: 1 %
HCT: 22.3 % — ABNORMAL LOW (ref 39.0–52.0)
Hemoglobin: 7.8 g/dL — ABNORMAL LOW (ref 13.0–17.0)
Immature Granulocytes: 2 %
Lymphocytes Relative: 37 %
Lymphs Abs: 1.2 10*3/uL (ref 0.7–4.0)
MCH: 34.4 pg — ABNORMAL HIGH (ref 26.0–34.0)
MCHC: 35 g/dL (ref 30.0–36.0)
MCV: 98.2 fL (ref 80.0–100.0)
Monocytes Absolute: 0.3 10*3/uL (ref 0.1–1.0)
Monocytes Relative: 9 %
Neutro Abs: 1.6 10*3/uL — ABNORMAL LOW (ref 1.7–7.7)
Neutrophils Relative %: 50 %
Platelets: 216 10*3/uL (ref 150–400)
RBC: 2.27 MIL/uL — ABNORMAL LOW (ref 4.22–5.81)
RDW: 16.7 % — ABNORMAL HIGH (ref 11.5–15.5)
WBC: 3.1 10*3/uL — ABNORMAL LOW (ref 4.0–10.5)
nRBC: 0 % (ref 0.0–0.2)

## 2019-03-22 LAB — COMPREHENSIVE METABOLIC PANEL
ALT: 20 U/L (ref 0–44)
AST: 27 U/L (ref 15–41)
Albumin: 3.4 g/dL — ABNORMAL LOW (ref 3.5–5.0)
Alkaline Phosphatase: 66 U/L (ref 38–126)
Anion gap: 6 (ref 5–15)
BUN: 29 mg/dL — ABNORMAL HIGH (ref 8–23)
CO2: 29 mmol/L (ref 22–32)
Calcium: 8.6 mg/dL — ABNORMAL LOW (ref 8.9–10.3)
Chloride: 96 mmol/L — ABNORMAL LOW (ref 98–111)
Creatinine, Ser: 1.45 mg/dL — ABNORMAL HIGH (ref 0.61–1.24)
GFR calc Af Amer: 53 mL/min — ABNORMAL LOW (ref >60)
GFR calc non Af Amer: 45 mL/min — ABNORMAL LOW (ref >60)
Glucose, Bld: 123 mg/dL — ABNORMAL HIGH (ref 70–99)
Potassium: 3.8 mmol/L (ref 3.5–5.1)
Sodium: 131 mmol/L — ABNORMAL LOW (ref 135–145)
Total Bilirubin: 0.5 mg/dL (ref 0.3–1.2)
Total Protein: 6.5 g/dL (ref 6.5–8.1)

## 2019-03-22 MED ORDER — HEPARIN SOD (PORK) LOCK FLUSH 100 UNIT/ML IV SOLN
500.0000 [IU] | Freq: Once | INTRAVENOUS | Status: AC | PRN
  Administered 2019-03-22: 500 [IU]
  Filled 2019-03-22: qty 5

## 2019-03-22 MED ORDER — PROCHLORPERAZINE MALEATE 10 MG PO TABS
10.0000 mg | ORAL_TABLET | Freq: Once | ORAL | Status: AC
  Administered 2019-03-22: 10 mg via ORAL
  Filled 2019-03-22: qty 1

## 2019-03-22 MED ORDER — SODIUM CHLORIDE 0.9 % IV SOLN
2000.0000 mg | Freq: Once | INTRAVENOUS | Status: AC
  Administered 2019-03-22: 2000 mg via INTRAVENOUS
  Filled 2019-03-22: qty 52.6

## 2019-03-22 MED ORDER — SODIUM CHLORIDE 0.9 % IV SOLN
Freq: Once | INTRAVENOUS | Status: AC
  Administered 2019-03-22: 10:00:00 via INTRAVENOUS
  Filled 2019-03-22: qty 250

## 2019-03-22 MED ORDER — SODIUM CHLORIDE 0.9% FLUSH
10.0000 mL | Freq: Once | INTRAVENOUS | Status: AC
  Administered 2019-03-22: 10 mL via INTRAVENOUS
  Filled 2019-03-22: qty 10

## 2019-03-22 MED ORDER — SODIUM CHLORIDE 0.9 % IV SOLN
Freq: Once | INTRAVENOUS | Status: AC
  Administered 2019-03-22: 1000 mL via INTRAVENOUS
  Filled 2019-03-22: qty 250

## 2019-03-22 NOTE — Progress Notes (Signed)
Patient here for follow up. No new concerns voiced.  °

## 2019-03-22 NOTE — Progress Notes (Signed)
Hgb: 7.8. MD, Dr. Tasia Catchings, aware. Per MD order: proceed with scheduled Gemzar treatment today.

## 2019-03-22 NOTE — Progress Notes (Signed)
[place]  Telephone:(336) [phone] Fax:(336) [phone]  ID: Philip [name] OB: 1941/09/27  MR#: [id number]  AOZ#:[id number]  Patient Care Team: Kirk Ruths, MD as PCP - General (Internal Medicine)  CHIEF COMPLAINT: Stage II invasive bladder cancer.  INTERVAL HISTORY:  78 year old male with history of stage II invasive bladder cancer present for evaluation prior to cycle 4-day 8 gemcitabine treatment. He follows up with Dr. [name] as his primary oncologist.  Due to COVID pandemic, Dr. [name] works remotely from home this week.  I am covering Dr. [name] to see this patient at the cancer center.  Patient reports feeling well today.  He continues to tolerate his treatment well without significant side effects.  Denies any nausea vomiting or diarrhea. He reports that he is drinking plenty of water daily.  Denies tinnitus. No fever or chills, shortness of breath or chest pain.  Appetite remains well.  Weight is stable.  REVIEW OF SYSTEMS:   Review of Systems  Constitutional: Negative.  Negative for chills, fever, malaise/fatigue and weight loss.  HENT: Negative for sore throat.   Eyes: Negative for redness.  Respiratory: Negative.  Negative for cough, hemoptysis, shortness of breath and wheezing.   Cardiovascular: Negative.  Negative for chest pain, palpitations and leg swelling.  Gastrointestinal: Negative.  Negative for abdominal pain, blood in stool, constipation, nausea and vomiting.  Genitourinary: Negative.  Negative for dysuria and hematuria.  Musculoskeletal: Negative.  Negative for back pain and myalgias.  Skin: Negative.  Negative for rash.  Neurological: Negative.  Negative for dizziness, tingling, tremors, focal weakness, weakness and headaches.  Endo/Heme/Allergies: Does not bruise/bleed easily.  Psychiatric/Behavioral: Negative.  Negative for hallucinations. The patient is not nervous/anxious.     As per HPI. Otherwise, a complete review of  systems is negative.  PAST MEDICAL HISTORY: Past Medical History:  Diagnosis Date  . Hypertension     PAST SURGICAL HISTORY: Past Surgical History:  Procedure Laterality Date  . BACK SURGERY    . CATARACT EXTRACTION    . HERNIA REPAIR    . PORTA CATH INSERTION N/A 01/01/2019   Procedure: PORTA CATH INSERTION;  Surgeon: Algernon Huxley, MD;  Location: [place] [facility];  Service: Cardiovascular;  Laterality: N/A;  . TONSILLECTOMY    . TRANSURETHRAL RESECTION OF BLADDER TUMOR N/A 12/08/2018   Procedure: TRANSURETHRAL RESECTION OF BLADDER TUMOR (TURBT);  Surgeon: Billey Co, MD;  Location: [facility];  Service: Urology;  Laterality: N/A;    FAMILY HISTORY: History reviewed. No pertinent family history.  ADVANCED DIRECTIVES (Y/N):  N  HEALTH MAINTENANCE: Social History   Tobacco Use  . Smoking status: Former Smoker    Years: 35.00    Types: Cigarettes    Last attempt to quit: 11/29/1993    Years since quitting: 25.3  . Smokeless tobacco: Never Used  Substance Use Topics  . Alcohol use: Yes    Comment: a couple of drinks a day all the above  . Drug use: Never     Colonoscopy:  PAP:  Bone density:  Lipid panel:  No Known Allergies  Current Outpatient Medications  Medication Sig Dispense Refill  . aspirin EC 81 MG tablet Take by mouth.    Marland Kitchen atorvastatin (LIPITOR) 20 MG tablet Take 20 mg by mouth daily.    Marland Kitchen lidocaine-prilocaine (EMLA) cream Apply to affected area once    . lisinopril-hydrochlorothiazide (PRINZIDE,ZESTORETIC) 20-12.5 MG tablet Take 1 tablet by mouth daily.     . Multiple Vitamins-Minerals (CENTRUM WOMEN) TABS  Take 1 tablet by mouth daily.    . ondansetron (ZOFRAN) 8 MG tablet Take 1 tablet (8 mg total) by mouth 2 (two) times daily as needed. Start on the third day after cisplatin chemotherapy. 30 tablet 2  . prochlorperazine (COMPAZINE) 10 MG tablet Take 1 tablet (10 mg total) by mouth every 6 (six) hours as needed (Nausea or vomiting). 60 tablet 2  .  sodium fluoride (DENTAGEL) 1.1 % GEL dental gel 1 ampule.    Marland Kitchen VITAMIN D PO Take 2,000 Units by mouth once a week.     . Vitamin D, Ergocalciferol, (DRISDOL) 1.25 MG (50000 UT) CAPS capsule Take by mouth.     No current facility-administered medications for this visit.     OBJECTIVE: Vitals:   03/22/19 0849  BP: 129/66  Pulse: 75  Resp: 18  Temp: (!) 97.1 F (36.2 C)     Body mass index is 28.65 kg/m.    ECOG FS:0 - Asymptomatic  Physical Exam  Constitutional: He is oriented to person, place, and time. No distress.  HENT:  Head: Normocephalic and atraumatic.  Nose: Nose normal.  Mouth/Throat: Oropharynx is clear and moist. No oropharyngeal exudate.  Eyes: Pupils are equal, round, and reactive to light. EOM are normal. No scleral icterus.  Neck: Normal range of motion. Neck supple.  Cardiovascular: Normal rate and regular rhythm.  No murmur heard. Pulmonary/Chest: Effort normal. No respiratory distress. He has no rales. He exhibits no tenderness.  Abdominal: Soft. He exhibits no distension. There is no abdominal tenderness.  Musculoskeletal: Normal range of motion.        General: No edema.  Neurological: He is alert and oriented to person, place, and time. No cranial nerve deficit. He exhibits normal muscle tone. Coordination normal.  Skin: Skin is warm and dry. He is not diaphoretic. No erythema.  Psychiatric: Affect normal.    LAB RESULTS:  Lab Results  Component Value Date   NA 131 (L) 03/15/2019   K 4.1 03/15/2019   CL 99 03/15/2019   CO2 27 03/15/2019   GLUCOSE 102 (H) 03/15/2019   BUN 21 03/15/2019   CREATININE 1.07 03/15/2019   CALCIUM 8.8 (L) 03/15/2019   PROT 6.8 03/15/2019   ALBUMIN 3.6 03/15/2019   AST 19 03/15/2019   ALT 15 03/15/2019   ALKPHOS 68 03/15/2019   BILITOT 0.3 03/15/2019   GFRNONAA >60 03/15/2019   GFRAA >60 03/15/2019    Lab Results  Component Value Date   WBC 3.1 (L) 03/22/2019   NEUTROABS 1.6 (L) 03/22/2019   HGB 7.8 (L)  03/22/2019   HCT 22.3 (L) 03/22/2019   MCV 98.2 03/22/2019   PLT 216 03/22/2019     STUDIES: No results found.  ASSESSMENT: Stage II invasive bladder cancer 1. Malignant neoplasm of overlapping sites of bladder ([place])   2. AKI (acute kidney injury) ([place])   3. Anemia due to antineoplastic chemotherapy   4. Chemotherapy-induced neutropenia ([place])    #Labs are reviewed and discussed with patient. Clinically he tolerates chemotherapy well. Kidney function worsened and creatinine increased to 1.45, likely nephrotoxicity from cisplatin. I will proceed with 1 L of IV fluid for hydration.  Encourage patient to increase oral hydration Repeat kidney function in 1 week. Gemcitabine does not need to have any dose adjust for kidney function.  Anemia, likely secondary to chemotherapy, hemoglobin 7.8.  Patient is fairly asymptomatic. I recommend repeat CBC in 1 week,   Proceed with cycle 4-day 8 gemcitabine treatment today. After this  he will have completed 4 cycles of cisplatin and gemcitabine.  Patient tells me that he has an appointment with [facility] in June and he plans to call if this appointment can be moved up.  Patient's future appointment will be to be determined, pending on his surgical plan. He is going to continue follow-up with Dr. [name] in future for the management of bladder cancer.  Chronic hyponatremia, sodium level unchanged.  Continue to monitor. Mild neutropenia with ANC of 1.6.  Continue to monitor.  Cancer Staging Bladder cancer [facility]) Staging form: Urinary Bladder, AJCC 8th Edition - Clinical stage from 12/25/2018: Stage II (cT2, cN0, cM0) - Signed by Lloyd Huger, MD on 12/25/2018  CC Dr. Hulen Shouts, MD   03/22/2019 8:50 AM

## 2019-03-27 ENCOUNTER — Other Ambulatory Visit: Payer: Self-pay

## 2019-03-28 ENCOUNTER — Inpatient Hospital Stay: Payer: Medicare Other | Attending: Oncology

## 2019-03-28 ENCOUNTER — Other Ambulatory Visit: Payer: Self-pay

## 2019-03-28 DIAGNOSIS — C678 Malignant neoplasm of overlapping sites of bladder: Secondary | ICD-10-CM | POA: Diagnosis not present

## 2019-03-28 LAB — CBC WITH DIFFERENTIAL/PLATELET
Abs Immature Granulocytes: 0.02 10*3/uL (ref 0.00–0.07)
Basophils Absolute: 0 10*3/uL (ref 0.0–0.1)
Basophils Relative: 0 %
Eosinophils Absolute: 0 10*3/uL (ref 0.0–0.5)
Eosinophils Relative: 0 %
HCT: 20 % — ABNORMAL LOW (ref 39.0–52.0)
Hemoglobin: 6.9 g/dL — ABNORMAL LOW (ref 13.0–17.0)
Immature Granulocytes: 1 %
Lymphocytes Relative: 27 %
Lymphs Abs: 1 10*3/uL (ref 0.7–4.0)
MCH: 35 pg — ABNORMAL HIGH (ref 26.0–34.0)
MCHC: 34.5 g/dL (ref 30.0–36.0)
MCV: 101.5 fL — ABNORMAL HIGH (ref 80.0–100.0)
Monocytes Absolute: 0.1 10*3/uL (ref 0.1–1.0)
Monocytes Relative: 2 %
Neutro Abs: 2.7 10*3/uL (ref 1.7–7.7)
Neutrophils Relative %: 70 %
Platelets: 87 10*3/uL — ABNORMAL LOW (ref 150–400)
RBC: 1.97 MIL/uL — ABNORMAL LOW (ref 4.22–5.81)
RDW: 16.7 % — ABNORMAL HIGH (ref 11.5–15.5)
WBC: 3.9 10*3/uL — ABNORMAL LOW (ref 4.0–10.5)
nRBC: 0 % (ref 0.0–0.2)

## 2019-03-28 LAB — COMPREHENSIVE METABOLIC PANEL
ALT: 15 U/L (ref 0–44)
AST: 21 U/L (ref 15–41)
Albumin: 3.7 g/dL (ref 3.5–5.0)
Alkaline Phosphatase: 66 U/L (ref 38–126)
Anion gap: 7 (ref 5–15)
BUN: 33 mg/dL — ABNORMAL HIGH (ref 8–23)
CO2: 27 mmol/L (ref 22–32)
Calcium: 8.8 mg/dL — ABNORMAL LOW (ref 8.9–10.3)
Chloride: 99 mmol/L (ref 98–111)
Creatinine, Ser: 1.43 mg/dL — ABNORMAL HIGH (ref 0.61–1.24)
GFR calc Af Amer: 54 mL/min — ABNORMAL LOW (ref >60)
GFR calc non Af Amer: 46 mL/min — ABNORMAL LOW (ref >60)
Glucose, Bld: 108 mg/dL — ABNORMAL HIGH (ref 70–99)
Potassium: 4 mmol/L (ref 3.5–5.1)
Sodium: 133 mmol/L — ABNORMAL LOW (ref 135–145)
Total Bilirubin: 0.5 mg/dL (ref 0.3–1.2)
Total Protein: 6.6 g/dL (ref 6.5–8.1)

## 2019-04-16 ENCOUNTER — Telehealth: Payer: Self-pay

## 2019-04-16 ENCOUNTER — Telehealth: Payer: Self-pay | Admitting: *Deleted

## 2019-04-16 NOTE — Telephone Encounter (Signed)
Patient called asking if he needs another blood test. He has no future appts scheduled with Dr [name] or for labs.Please advise

## 2019-04-16 NOTE — Telephone Encounter (Signed)
He is to return 1-2 weeks post-op.  Does not need lab at this time.

## 2019-04-16 NOTE — Telephone Encounter (Signed)
Follow up appointment with Dr. [name] is scheduled to be on 06/07/2019 after having his surgery on 05/21/2019 at [facility]. Patient was notified.

## 2019-04-16 NOTE — Telephone Encounter (Signed)
Please read prior message. Patient is scheduled to come back to see Dr. [name] after his surgery on 05/21/2019. Patient will be contacted with his follow up appointment with Dr. [name]. Brooke, can you please schedule a follow up appointment with Dr. [name] two weeks after his surgery on 05/21/2019 and let patient know. Thank you!

## 2019-04-16 NOTE — Telephone Encounter (Signed)
Patient informed of doctor response and he reports that he has not scheduled his surgery as of et

## 2019-05-26 ENCOUNTER — Observation Stay
Admission: EM | Admit: 2019-05-26 | Discharge: 2019-05-27 | Disposition: A | Discharge status: Other Acute Inpatient Hospital | Payer: Medicare Other | Attending: Specialist | Admitting: Specialist

## 2019-05-26 ENCOUNTER — Other Ambulatory Visit: Payer: Self-pay

## 2019-05-26 ENCOUNTER — Emergency Department: Payer: Medicare Other

## 2019-05-26 DIAGNOSIS — Z9889 Other specified postprocedural states: Secondary | ICD-10-CM | POA: Insufficient documentation

## 2019-05-26 DIAGNOSIS — E860 Dehydration: Secondary | ICD-10-CM | POA: Diagnosis not present

## 2019-05-26 DIAGNOSIS — Z7982 Long term (current) use of aspirin: Secondary | ICD-10-CM | POA: Insufficient documentation

## 2019-05-26 DIAGNOSIS — Z87891 Personal history of nicotine dependence: Secondary | ICD-10-CM | POA: Insufficient documentation

## 2019-05-26 DIAGNOSIS — N179 Acute kidney failure, unspecified: Secondary | ICD-10-CM | POA: Insufficient documentation

## 2019-05-26 DIAGNOSIS — T796XXA Traumatic ischemia of muscle, initial encounter: Secondary | ICD-10-CM | POA: Insufficient documentation

## 2019-05-26 DIAGNOSIS — Z7901 Long term (current) use of anticoagulants: Secondary | ICD-10-CM | POA: Insufficient documentation

## 2019-05-26 DIAGNOSIS — I10 Essential (primary) hypertension: Secondary | ICD-10-CM | POA: Insufficient documentation

## 2019-05-26 DIAGNOSIS — Z791 Long term (current) use of non-steroidal anti-inflammatories (NSAID): Secondary | ICD-10-CM | POA: Diagnosis not present

## 2019-05-26 DIAGNOSIS — Z1159 Encounter for screening for other viral diseases: Secondary | ICD-10-CM | POA: Diagnosis not present

## 2019-05-26 DIAGNOSIS — M6282 Rhabdomyolysis: Secondary | ICD-10-CM

## 2019-05-26 DIAGNOSIS — E876 Hypokalemia: Secondary | ICD-10-CM | POA: Insufficient documentation

## 2019-05-26 DIAGNOSIS — E785 Hyperlipidemia, unspecified: Secondary | ICD-10-CM | POA: Insufficient documentation

## 2019-05-26 DIAGNOSIS — Z66 Do not resuscitate: Secondary | ICD-10-CM | POA: Insufficient documentation

## 2019-05-26 DIAGNOSIS — Z79899 Other long term (current) drug therapy: Secondary | ICD-10-CM | POA: Diagnosis not present

## 2019-05-26 DIAGNOSIS — W010XXA Fall on same level from slipping, tripping and stumbling without subsequent striking against object, initial encounter: Secondary | ICD-10-CM | POA: Diagnosis not present

## 2019-05-26 DIAGNOSIS — C679 Malignant neoplasm of bladder, unspecified: Secondary | ICD-10-CM | POA: Insufficient documentation

## 2019-05-26 DIAGNOSIS — Z8249 Family history of ischemic heart disease and other diseases of the circulatory system: Secondary | ICD-10-CM | POA: Diagnosis not present

## 2019-05-26 DIAGNOSIS — R531 Weakness: Secondary | ICD-10-CM

## 2019-05-26 HISTORY — DX: Malignant neoplasm of bladder, unspecified: C67.9

## 2019-05-26 LAB — CBC WITH DIFFERENTIAL/PLATELET
Abs Immature Granulocytes: 0.07 10*3/uL (ref 0.00–0.07)
Basophils Absolute: 0 10*3/uL (ref 0.0–0.1)
Basophils Relative: 0 %
Eosinophils Absolute: 0 10*3/uL (ref 0.0–0.5)
Eosinophils Relative: 0 %
HCT: 28 % — ABNORMAL LOW (ref 39.0–52.0)
Hemoglobin: 9.5 g/dL — ABNORMAL LOW (ref 13.0–17.0)
Immature Granulocytes: 1 %
Lymphocytes Relative: 11 %
Lymphs Abs: 0.9 10*3/uL (ref 0.7–4.0)
MCH: 35.3 pg — ABNORMAL HIGH (ref 26.0–34.0)
MCHC: 33.9 g/dL (ref 30.0–36.0)
MCV: 104.1 fL — ABNORMAL HIGH (ref 80.0–100.0)
Monocytes Absolute: 1 10*3/uL (ref 0.1–1.0)
Monocytes Relative: 12 %
Neutro Abs: 6.3 10*3/uL (ref 1.7–7.7)
Neutrophils Relative %: 76 %
Platelets: 192 10*3/uL (ref 150–400)
RBC: 2.69 MIL/uL — ABNORMAL LOW (ref 4.22–5.81)
RDW: 12.1 % (ref 11.5–15.5)
WBC: 8.2 10*3/uL (ref 4.0–10.5)
nRBC: 0 % (ref 0.0–0.2)

## 2019-05-26 LAB — COMPREHENSIVE METABOLIC PANEL
ALT: 24 U/L (ref 0–44)
AST: 50 U/L — ABNORMAL HIGH (ref 15–41)
Albumin: 3 g/dL — ABNORMAL LOW (ref 3.5–5.0)
Alkaline Phosphatase: 51 U/L (ref 38–126)
Anion gap: 12 (ref 5–15)
BUN: 29 mg/dL — ABNORMAL HIGH (ref 8–23)
CO2: 20 mmol/L — ABNORMAL LOW (ref 22–32)
Calcium: 8.7 mg/dL — ABNORMAL LOW (ref 8.9–10.3)
Chloride: 102 mmol/L (ref 98–111)
Creatinine, Ser: 1.32 mg/dL — ABNORMAL HIGH (ref 0.61–1.24)
GFR calc Af Amer: 59 mL/min — ABNORMAL LOW (ref >60)
GFR calc non Af Amer: 51 mL/min — ABNORMAL LOW (ref >60)
Glucose, Bld: 109 mg/dL — ABNORMAL HIGH (ref 70–99)
Potassium: 3 mmol/L — ABNORMAL LOW (ref 3.5–5.1)
Sodium: 134 mmol/L — ABNORMAL LOW (ref 135–145)
Total Bilirubin: 0.8 mg/dL (ref 0.3–1.2)
Total Protein: 6.2 g/dL — ABNORMAL LOW (ref 6.5–8.1)

## 2019-05-26 LAB — CK: Total CK: 1761 U/L — ABNORMAL HIGH (ref 49–397)

## 2019-05-26 LAB — MAGNESIUM: Magnesium: 2 mg/dL (ref 1.7–2.4)

## 2019-05-26 LAB — SARS CORONAVIRUS 2 BY RT PCR (HOSPITAL ORDER, PERFORMED IN ~~LOC~~ HOSPITAL LAB): SARS Coronavirus 2: NEGATIVE

## 2019-05-26 MED ORDER — SODIUM CHLORIDE 0.9 % IV BOLUS
250.0000 mL | Freq: Once | INTRAVENOUS | Status: AC
  Administered 2019-05-26: 250 mL via INTRAVENOUS

## 2019-05-26 MED ORDER — Medication
Status: DC
Start: ? — End: 2019-05-26

## 2019-05-26 MED ORDER — VITAMIN D (ERGOCALCIFEROL) 1.25 MG (50000 UNIT) PO CAPS
50000.0000 [IU] | ORAL_CAPSULE | ORAL | Status: DC
  Administered 2019-05-26: 21:00:00 50000 [IU] via ORAL
  Filled 2019-05-26: qty 1

## 2019-05-26 MED ORDER — GLUCOSAMINE-CHONDROIT-COLLAGEN PO
200.00 | ORAL | Status: DC
Start: 2019-05-25 — End: 2019-05-26

## 2019-05-26 MED ORDER — ENOXAPARIN SODIUM 40 MG/0.4ML ~~LOC~~ SOLN
40.0000 mg | SUBCUTANEOUS | Status: DC
  Administered 2019-05-26: 21:00:00 40 mg via SUBCUTANEOUS
  Filled 2019-05-26: qty 0.4

## 2019-05-26 MED ORDER — ONDANSETRON HCL 4 MG/2ML IJ SOLN: 4.0000 mg | Freq: Four times a day (QID) | INTRAMUSCULAR | Status: DC | PRN

## 2019-05-26 MED ORDER — APAP PO
20.00 | ORAL | Status: DC
Start: 2019-05-25 — End: 2019-05-26

## 2019-05-26 MED ORDER — SENNOSIDES 8.6 MG PO TABS: 8.6000 mg | ORAL_TABLET | Freq: Every day | ORAL | Status: DC

## 2019-05-26 MED ORDER — Medication
25.00 | Status: DC
Start: 2019-05-26 — End: 2019-05-26

## 2019-05-26 MED ORDER — MEDI-TUSSIN DM DOUBLE STRENGTH 30-200 MG/5ML PO LIQD
1000.00 | ORAL | Status: DC
Start: 2019-05-25 — End: 2019-05-26

## 2019-05-26 MED ORDER — ADULT MULTIVITAMIN W/MINERALS CH: 1.0000 | ORAL_TABLET | Freq: Every day | ORAL | Status: DC

## 2019-05-26 MED ORDER — GENERIC EXTERNAL MEDICATION
2.00 | Status: DC
Start: 2019-05-25 — End: 2019-05-26

## 2019-05-26 MED ORDER — LACTATED RINGERS IV SOLN
10.00 | INTRAVENOUS | Status: DC
Start: ? — End: 2019-05-26

## 2019-05-26 MED ORDER — SODIUM CHLORIDE 0.9 % IV SOLN
Freq: Once | INTRAVENOUS | Status: AC
  Administered 2019-05-26: 11:00:00 via INTRAVENOUS

## 2019-05-26 MED ORDER — SENNA 8.6 MG PO TABS: 1.0000 | ORAL_TABLET | Freq: Every day | ORAL | Status: DC

## 2019-05-26 MED ORDER — PSE 120 120-600 MG OR TB12
1.00 | ORAL_TABLET | ORAL | Status: DC
Start: ? — End: 2019-05-26

## 2019-05-26 MED ORDER — ONDANSETRON HCL 4 MG PO TABS: 4.0000 mg | ORAL_TABLET | Freq: Four times a day (QID) | ORAL | Status: DC | PRN

## 2019-05-26 MED ORDER — POTASSIUM CHLORIDE IN NACL 20-0.9 MEQ/L-% IV SOLN
INTRAVENOUS | Status: DC
  Administered 2019-05-26: 18:00:00 via INTRAVENOUS
  Filled 2019-05-26 (×3): qty 1000

## 2019-05-26 MED ORDER — Medication
Status: DC
Start: 2019-05-26 — End: 2019-05-26

## 2019-05-26 MED ORDER — ACETAMINOPHEN 325 MG PO TABS
650.0000 mg | ORAL_TABLET | Freq: Four times a day (QID) | ORAL | Status: DC | PRN
  Filled 2019-05-26: qty 2

## 2019-05-26 MED ORDER — CELECOXIB 100 MG PO CAPS
100.0000 mg | ORAL_CAPSULE | Freq: Two times a day (BID) | ORAL | Status: DC
  Administered 2019-05-26: 21:00:00 100 mg via ORAL
  Filled 2019-05-26 (×2): qty 1

## 2019-05-26 MED ORDER — ACETAMINOPHEN 650 MG RE SUPP
650.0000 mg | Freq: Four times a day (QID) | RECTAL | Status: DC | PRN
  Administered 2019-05-26: 21:00:00 650 mg via RECTAL

## 2019-05-26 MED ORDER — ATORVASTATIN CALCIUM 20 MG PO TABS
20.0000 mg | ORAL_TABLET | Freq: Every day | ORAL | Status: DC
  Administered 2019-05-26: 18:00:00 20 mg via ORAL
  Filled 2019-05-26: qty 1

## 2019-05-26 MED ORDER — NALOXONE HCL 0.4 MG/ML IJ SOLN
0.40 | INTRAMUSCULAR | Status: DC
Start: ? — End: 2019-05-26

## 2019-05-26 MED ORDER — CVS EAR DROPS OT
40.00 | OTIC | Status: DC
Start: 2019-05-26 — End: 2019-05-26

## 2019-05-26 NOTE — ED Triage Notes (Signed)
Pt arrives [facility] from home Dc yesterday from [facility]. had surgery to remove bladder for bladder fell during night. States he bent over to pick something up and toppled over, hit head. Has abrasion to head. Denies LOC. Multiple incision sites appear closed. L side wound open. bag has come loose as well. was sliding on floor to get to phone, has skinned knees from that. VSS, CBG 119.    [phone] daughter

## 2019-05-26 NOTE — Discharge Summary (Signed)
[facility] [place] NAME: Philip [name]    MR#:  [id number]  DATE OF BIRTH:  May 02, 1941  DATE OF ADMISSION:  05/26/2019 ADMITTING PHYSICIAN: Henreitta Leber, MD  DATE OF DISCHARGE: 05/26/2019  PRIMARY CARE PHYSICIAN: Kirk Ruths, MD    ADMISSION DIAGNOSIS:  Weakness [R53.1] Non-traumatic rhabdomyolysis [M62.82]  DISCHARGE DIAGNOSIS:  Active Problems:   Dehydration   SECONDARY DIAGNOSIS:   Past Medical History:  Diagnosis Date  . 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[place])   . Hypertension     HOSPITAL COURSE:   78 y.o. male with a known history of hypertension, bladder cancer, recent admission to [facility] status post robotic cystectomy with ileal conduit postop day #4 and recently discharged home who presents to the hospital after a fall due to generalized weakness.  1.  Status post fall-secondary to deconditioning and generalized weakness.  Patient had recent bladder surgery with ileal conduit and was recently discharged from [facility] and is significantly deconditioned. - We will gently hydrate the patient with IV fluids, get physical therapy evaluation.  Patient likely needs to be placed in short-term rehab at [facility].  2.  Acute rhabdomyolysis-this is traumatic in nature from his recent fall and patient being on the floor for prolonged period of time. - We will hydrate the patient with IV fluids, follow CKs.  3.  Acute kidney injury-secondary to dehydration.  We will gently hydrate the patient with IV fluids, follow BUN/creatinine urine output.  Hold patient's HCTZ and lisinopril for now.  4.  Hypokalemia-will supplement orally and intravenously and repeat level in the morning. -Check magnesium level.  Hold hydrochlorothiazide.  5.  History of bladder cancer-patient is status post robotic cystectomy with ileal conduit postop day #5 today. - Continue follow-up with urology and oncology as an outpatient.  6.  Essential  hypertension-patient's blood pressures been on the low side.  Hold hydrochlorothiazide, lisinopril, Toprol for now.  7.  Hyperlipidemia-continue atorvastatin.  Spoke to Pt's daughter over the phone and she wanted him transferred to [facility] as he was just discharged from there yesterday.  Pt. Is being transferred to [facility] for continuity of care and post-op management from his recent surgery.  Time spent coordinating care between [facility] at [facility], speaking to pt's family is 22 min.     DISCHARGE CONDITIONS:   Stable  CONSULTS OBTAINED:    DRUG ALLERGIES:  No Known Allergies  DISCHARGE MEDICATIONS:   Allergies as of 05/26/2019   No Known Allergies     Medication List    TAKE these medications   acetaminophen 500 MG tablet Commonly known as: TYLENOL Take 1,000 mg by mouth every 8 (eight) hours as needed for pain.   atorvastatin 20 MG tablet Commonly known as: LIPITOR Take 20 mg by mouth daily.   celecoxib 100 MG capsule Commonly known as: CELEBREX Take 100 mg by mouth 2 (two) times daily.   Centrum Women Tabs Take 1 tablet by mouth daily.   ciprofloxacin 500 MG tablet Commonly known as: CIPRO Take 500 mg by mouth See admin instructions. Take 1 tablet (500mg ) by mouth twice daily for three days - begin day before stent removal appointment   docusate sodium 100 MG capsule Commonly known as: COLACE Take 100 mg by mouth 2 (two) times daily.   enoxaparin 40 MG/0.4ML injection Commonly known as: LOVENOX Inject 40 mg into the skin daily.   lisinopril-hydrochlorothiazide 20-12.5 MG tablet Commonly known as: ZESTORETIC Take 1 tablet by  mouth daily.   metoprolol succinate 25 MG 24 hr tablet Commonly known as: TOPROL-XL Take 25 mg by mouth daily.   ondansetron 8 MG tablet Commonly known as: Zofran Take 1 tablet (8 mg total) by mouth 2 (two) times daily as needed. Start on the third day after cisplatin chemotherapy.   prochlorperazine 10 MG tablet Commonly  known as: COMPAZINE Take 1 tablet (10 mg total) by mouth every 6 (six) hours as needed (Nausea or vomiting).   Senna-Lax 8.6 MG tablet Generic drug: senna Take 8.6 mg by mouth daily.   Vitamin D (Ergocalciferol) 1.25 MG (50000 UT) Caps capsule Commonly known as: DRISDOL Take 50,000 Units by mouth every 7 (seven) days.         DISCHARGE INSTRUCTIONS:   DIET:  Cardiac diet  DISCHARGE CONDITION:  Stable  ACTIVITY:  Activity as tolerated  OXYGEN:  Home Oxygen: No.   Oxygen Delivery: room air  DISCHARGE LOCATION:  [facility] hospital.     If you experience worsening of your admission symptoms, develop shortness of breath, life threatening emergency, suicidal or homicidal thoughts you must seek medical attention immediately by calling 911 or calling your MD immediately  if symptoms less severe.  You Must read complete instructions/literature along with all the possible adverse reactions/side effects for all the Medicines you take and that have been prescribed to you. Take any new Medicines after you have completely understood and accpet all the possible adverse reactions/side effects.   Please note  You were cared for by a hospitalist during your hospital stay. If you have any questions about your discharge medications or the care you received while you were in the hospital after you are discharged, you can call the unit and asked to speak with the hospitalist on call if the hospitalist that took care of you is not available. Once you are discharged, your primary care physician will handle any further medical issues. Please note that NO REFILLS for any discharge medications will be authorized once you are discharged, as it is imperative that you return to your primary care physician (or establish a relationship with a primary care physician if you do not have one) for your aftercare needs so that they can reassess your need for medications and monitor your lab values.   DATA REVIEW:    CBC Recent Labs  Lab 05/26/19 1029  WBC 8.2  HGB 9.5*  HCT 28.0*  PLT 192    Chemistries  Recent Labs  Lab 05/26/19 1029 05/26/19 1254  NA 134*  --   K 3.0*  --   CL 102  --   CO2 20*  --   GLUCOSE 109*  --   BUN 29*  --   CREATININE 1.32*  --   CALCIUM 8.7*  --   MG  --  2.0  AST 50*  --   ALT 24  --   ALKPHOS 51  --   BILITOT 0.8  --     Cardiac Enzymes No results for input(s): TROPONINI in the last 168 hours.  Microbiology Results  Results for orders placed or performed during the hospital encounter of 05/26/19  SARS Coronavirus 2 (CEPHEID - Performed in [place] hospital lab), Hosp Order     Status: None   Collection Time: 05/26/19 11:30 AM   Specimen: Nasopharyngeal Swab  Result Value Ref Range Status   SARS Coronavirus 2 NEGATIVE NEGATIVE Final    Comment: (NOTE) If result is NEGATIVE SARS-CoV-2 target nucleic acids are NOT  DETECTED. The SARS-CoV-2 RNA is generally detectable in upper and lower  respiratory specimens during the acute phase of infection. The lowest  concentration of SARS-CoV-2 viral copies this assay can detect is 250  copies / mL. A negative result does not preclude SARS-CoV-2 infection  and should not be used as the sole basis for treatment or other  patient management decisions.  A negative result may occur with  improper specimen collection / handling, submission of specimen other  than nasopharyngeal swab, presence of viral mutation(s) within the  areas targeted by this assay, and inadequate number of viral copies  (<250 copies / mL). A negative result must be combined with clinical  observations, patient history, and epidemiological information. If result is POSITIVE SARS-CoV-2 target nucleic acids are DETECTED. The SARS-CoV-2 RNA is generally detectable in upper and lower  respiratory specimens dur ing the acute phase of infection.  Positive  results are indicative of active infection with SARS-CoV-2.  Clinical   correlation with patient history and other diagnostic information is  necessary to determine patient infection status.  Positive results do  not rule out bacterial infection or co-infection with other viruses. If result is PRESUMPTIVE POSTIVE SARS-CoV-2 nucleic acids MAY BE PRESENT.   A presumptive positive result was obtained on the submitted specimen  and confirmed on repeat testing.  While 2019 novel coronavirus  (SARS-CoV-2) nucleic acids may be present in the submitted sample  additional confirmatory testing may be necessary for epidemiological  and / or clinical management purposes  to differentiate between  SARS-CoV-2 and other Sarbecovirus currently known to infect humans.  If clinically indicated additional testing with an alternate test  methodology [phone]) is advised. The SARS-CoV-2 RNA is generally  detectable in upper and lower respiratory sp ecimens during the acute  phase of infection. The expected result is Negative. Fact Sheet for Patients:  StrictlyIdeas.no Fact Sheet for Healthcare Providers: BankingDealers.co.za This test is not yet approved or cleared by the [place] FDA and has been authorized for detection and/or diagnosis of SARS-CoV-2 by FDA under an Emergency Use Authorization (EUA).  This EUA will remain in effect (meaning this test can be used) for the duration of the COVID-19 declaration under Section 564(b)(1) of the Act, 21 U.S.C. section 360bbb-3(b)(1), unless the authorization is terminated or revoked sooner. Performed at [facility], [address] [place], [place] [address]     RADIOLOGY:  Ct Head Wo Contrast  Result Date: 05/26/2019 CLINICAL DATA:  Head trauma EXAM: CT HEAD WITHOUT CONTRAST CT CERVICAL SPINE WITHOUT CONTRAST TECHNIQUE: Multidetector CT imaging of the head and cervical spine was performed following the standard protocol without intravenous contrast. Multiplanar CT  image reconstructions of the cervical spine were also generated. COMPARISON:  None. FINDINGS: CT HEAD FINDINGS Brain: No evidence of acute infarction, hemorrhage, hydrocephalus, extra-axial collection or mass lesion/mass effect. Atrophy without specific pattern. 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 Vascular: Atherosclerotic calcification. Skull: Normal. Negative for fracture or focal lesion. Sinuses/Orbits: Mild mucosal thickening in the paranasal sinuses. CT CERVICAL SPINE FINDINGS Alignment: Normal Skull base and vertebrae: Negative for acute fracture.  Osteopenia. Soft tissues and spinal canal: No prevertebral fluid or swelling. No visible canal hematoma. Disc levels: ACDF with solid arthrodesis from C4-C7. 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 Upper chest: Negative IMPRESSION: No evidence of acute intracranial or cervical spine injury. Electronically Signed   By: Monte Fantasia M.D.   On: 05/26/2019 11:11   Ct Cervical Spine  Wo Contrast  Result Date: 05/26/2019 CLINICAL DATA:  Head trauma EXAM: CT HEAD WITHOUT CONTRAST CT CERVICAL SPINE WITHOUT CONTRAST TECHNIQUE: Multidetector CT imaging of the head and cervical spine was performed following the standard protocol without intravenous contrast. Multiplanar CT image reconstructions of the cervical spine were also generated. COMPARISON:  None. FINDINGS: CT HEAD FINDINGS Brain: No evidence of acute infarction, hemorrhage, hydrocephalus, extra-axial collection or mass lesion/mass effect. Atrophy without specific pattern. 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 Vascular: Atherosclerotic calcification. Skull: Normal. Negative for fracture or focal lesion. Sinuses/Orbits: Mild mucosal thickening in the paranasal sinuses. CT CERVICAL SPINE FINDINGS Alignment: Normal Skull base and vertebrae: Negative for acute fracture.  Osteopenia. Soft tissues and spinal  canal: No prevertebral fluid or swelling. No visible canal hematoma. Disc levels: ACDF with solid arthrodesis from C4-C7. 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 Upper chest: Negative IMPRESSION: No evidence of acute intracranial or cervical spine injury. Electronically Signed   By: Monte Fantasia M.D.   On: 05/26/2019 11:11      Management plans discussed with the patient, family and they are in agreement.  CODE STATUS:     Code Status Orders  (From admission, onward)         Start     Ordered   05/26/19 1544  Do not attempt resuscitation (DNR)  Continuous    Question Answer Comment  In the event of cardiac or respiratory ARREST Do not call a "code blue"   In the event of cardiac or respiratory ARREST Do not perform Intubation, CPR, defibrillation or ACLS   In the event of cardiac or respiratory ARREST Use medication by any route, position, wound care, and other measures to relive pain and suffering. May use oxygen, suction and manual treatment of airway obstruction as needed for comfort.      05/26/19 1543         TOTAL TIME TAKING CARE OF THIS PATIENT: 45 minutes.    Henreitta Leber M.D on 05/26/2019 at 6:31 PM  Between 7am to 6pm - Pager - [phone]  After 6pm go to [email or web address] - [employer] [facility]  Office  [phone]  CC: Primary care physician; Kirk Ruths, MD

## 2019-05-26 NOTE — ED Notes (Signed)
Admitting physician at bedside

## 2019-05-26 NOTE — H&P (Signed)
[facility] [place] NAME: Philip [name]    MR#:  [id number]  DATE OF BIRTH:  02-Feb-1941  DATE OF ADMISSION:  05/26/2019  PRIMARY CARE PHYSICIAN: Kirk Ruths, MD   REQUESTING/REFERRING PHYSICIAN: Dr. Merlyn Lot.   CHIEF COMPLAINT:   Chief Complaint  Patient presents with  . Post-op Problem  . Fall    HISTORY OF PRESENT ILLNESS:  Philip [name]  is a 78 y.o. male with a known history of hypertension, bladder cancer, recent admission to [facility] status post robotic cystectomy with ileal conduit postop day #4 and recently discharged home who presents to the hospital after a fall due to generalized weakness.  Patient was in his usual state of health when yesterday evening he fell in his pantry trying to get something and was too weak to get up from the floor and therefore crawled and called his family on the phone and therefore he was brought by EMS to the hospital.  Patient on blood work in the ER was noted to be in acute kidney injury with mild hypokalemia and also had noted to have some rhabdomyolysis.  He is being admitted for treatment of rhabdomyolysis and mild dehydration.  Patient denies any shortness of breath, cough, fever, chills or any other associated symptoms presently.  Patient's COVID-19 test is negative.  PAST MEDICAL HISTORY:   Past Medical History:  Diagnosis Date  . 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[place])   . Hypertension     PAST SURGICAL HISTORY:   Past Surgical History:  Procedure Laterality Date  . BACK SURGERY    . CATARACT EXTRACTION    . HERNIA REPAIR    . PORTA CATH INSERTION N/A 01/01/2019   Procedure: PORTA CATH INSERTION;  Surgeon: Algernon Huxley, MD;  Location: [place] CV [facility];  Service: Cardiovascular;  Laterality: N/A;  . TONSILLECTOMY    . TRANSURETHRAL RESECTION OF BLADDER TUMOR N/A 12/08/2018   Procedure: TRANSURETHRAL RESECTION OF BLADDER TUMOR (TURBT);  Surgeon: Billey Co, MD;  Location: [facility] ORS;   Service: Urology;  Laterality: N/A;    SOCIAL HISTORY:   Social History   Tobacco Use  . Smoking status: Former Smoker    Years: 35.00    Types: Cigarettes    Quit date: 11/29/1993    Years since quitting: 25.5  . Smokeless tobacco: Never Used  Substance Use Topics  . Alcohol use: Yes    Comment: a couple of drinks a day all the above    FAMILY HISTORY:   Family History  Problem Relation Age of Onset  . Heart failure Mother     DRUG ALLERGIES:  No Known Allergies  REVIEW OF SYSTEMS:   Review of Systems  Constitutional: Negative for fever and weight loss.  HENT: Negative for congestion, nosebleeds and tinnitus.   Eyes: Negative for blurred vision, double vision and redness.  Respiratory: Negative for cough, hemoptysis and shortness of breath.   Cardiovascular: Negative for chest pain, orthopnea, leg swelling and PND.  Gastrointestinal: Negative for abdominal pain, diarrhea, melena, nausea and vomiting.  Genitourinary: Negative for dysuria, hematuria and urgency.  Musculoskeletal: Positive for falls. Negative for joint pain.  Neurological: Positive for weakness (generalized). Negative for dizziness, tingling, sensory change, focal weakness, seizures and headaches.  Endo/Heme/Allergies: Negative for polydipsia. Does not bruise/bleed easily.  Psychiatric/Behavioral: Negative for depression and memory loss. The patient is not nervous/anxious.     MEDICATIONS AT HOME:   Prior to Admission medications   Medication  Sig Start Date End Date Taking? Authorizing Provider  acetaminophen (TYLENOL) 500 MG tablet Take 1,000 mg by mouth every 8 (eight) hours as needed for pain. 05/25/19 06/01/19 Yes [provider]  atorvastatin (LIPITOR) 20 MG tablet Take 20 mg by mouth daily. 05/02/18  Yes [provider]  lisinopril-hydrochlorothiazide (ZESTORETIC) 20-12.5 MG tablet Take 1 tablet by mouth daily. 03/16/19  Yes [provider]  Multiple Vitamins-Minerals  (CENTRUM WOMEN) TABS Take 1 tablet by mouth daily.   Yes [provider]  Vitamin D, Ergocalciferol, (DRISDOL) 1.25 MG (50000 UT) CAPS capsule Take 50,000 Units by mouth every 7 (seven) days.    Yes [provider]  celecoxib (CELEBREX) 100 MG capsule Take 100 mg by mouth 2 (two) times daily.    [provider]  ciprofloxacin (CIPRO) 500 MG tablet Take 500 mg by mouth See admin instructions. Take 1 tablet (500mg ) by mouth twice daily for three days - begin day before stent removal appointment 05/25/19 05/28/19  [provider]  docusate sodium (COLACE) 100 MG capsule Take 100 mg by mouth 2 (two) times daily. 05/25/19 06/24/19  [provider]  enoxaparin (LOVENOX) 40 MG/0.4ML injection Inject 40 mg into the skin daily.    [provider]  metoprolol succinate (TOPROL-XL) 25 MG 24 hr tablet Take 25 mg by mouth daily. 05/21/19   [provider]  ondansetron (ZOFRAN) 8 MG tablet Take 1 tablet (8 mg total) by mouth 2 (two) times daily as needed. Start on the third day after cisplatin chemotherapy. Patient not taking: Reported on 05/26/2019 12/26/18   Lloyd Huger, MD  prochlorperazine (COMPAZINE) 10 MG tablet Take 1 tablet (10 mg total) by mouth every 6 (six) hours as needed (Nausea or vomiting). Patient not taking: Reported on 05/26/2019 12/26/18   Lloyd Huger, MD  senna (SENNA-LAX) 8.6 MG tablet Take 8.6 mg by mouth daily. 05/25/19 06/24/19  [provider]      VITAL SIGNS:  Blood pressure 119/60, pulse 79, temperature (!) 97.5 F (36.4 C), temperature source Oral, resp. rate 15, height 5\' 8"  (1.727 m), weight 82.6 kg, SpO2 100 %.  PHYSICAL EXAMINATION:  Physical Exam  GENERAL:  78 y.o.-year-old patient lying in the bed in no acute distress.  EYES: Pupils equal, round, reactive to light and accommodation. No scleral icterus. Extraocular muscles intact.  HEENT: Head atraumatic, normocephalic. Oropharynx and nasopharynx  clear. No oropharyngeal erythema, moist oral mucosa  NECK:  Supple, no jugular venous distention. No thyroid enlargement, no tenderness.  LUNGS: Normal breath sounds bilaterally, no wheezing, rales, rhonchi. No use of accessory muscles of respiration.  CARDIOVASCULAR: S1, S2 RRR. No murmurs, rubs, gallops, clicks.  ABDOMEN: Soft, nontender, nondistended. Bowel sounds present. No organomegaly or mass.  Positive scar from recent surgery with ileal conduit in place with yellow urine draining. EXTREMITIES: No pedal edema, cyanosis, or clubbing. + 2 pedal & radial pulses b/l.  Right knee abrasions from fall.   NEUROLOGIC: Cranial nerves II through XII are intact. No focal Motor or sensory deficits appreciated b/l. Globally weak.  PSYCHIATRIC: The patient is alert and oriented x 3.  SKIN: No obvious rash, lesion, or ulcer.   LABORATORY PANEL:   CBC Recent Labs  Lab 05/26/19 1029  WBC 8.2  HGB 9.5*  HCT 28.0*  PLT 192   ------------------------------------------------------------------------------------------------------------------  Chemistries  Recent Labs  Lab 05/26/19 1029  NA 134*  K 3.0*  CL 102  CO2 20*  GLUCOSE 109*  BUN 29*  CREATININE 1.32*  CALCIUM 8.7*  AST 50*  ALT 24  ALKPHOS 51  BILITOT 0.8   ------------------------------------------------------------------------------------------------------------------  Cardiac Enzymes No results for input(s): TROPONINI in the last 168 hours. ------------------------------------------------------------------------------------------------------------------  RADIOLOGY:  Ct Head Wo Contrast  Result Date: 05/26/2019 CLINICAL DATA:  Head trauma EXAM: CT HEAD WITHOUT CONTRAST CT CERVICAL SPINE WITHOUT CONTRAST TECHNIQUE: Multidetector CT imaging of the head and cervical spine was performed following the standard protocol without intravenous contrast. Multiplanar CT image reconstructions of the cervical spine were also generated.  COMPARISON:  None. FINDINGS: CT HEAD FINDINGS Brain: No evidence of acute infarction, hemorrhage, hydrocephalus, extra-axial collection or mass lesion/mass effect. Atrophy without specific pattern. 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 Vascular: Atherosclerotic calcification. Skull: Normal. Negative for fracture or focal lesion. Sinuses/Orbits: Mild mucosal thickening in the paranasal sinuses. CT CERVICAL SPINE FINDINGS Alignment: Normal Skull base and vertebrae: Negative for acute fracture.  Osteopenia. Soft tissues and spinal canal: No prevertebral fluid or swelling. No visible canal hematoma. Disc levels: ACDF with solid arthrodesis from C4-C7. 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 Upper chest: Negative IMPRESSION: No evidence of acute intracranial or cervical spine injury. Electronically Signed   By: Monte Fantasia M.D.   On: 05/26/2019 11:11   Ct Cervical Spine Wo Contrast  Result Date: 05/26/2019 CLINICAL DATA:  Head trauma EXAM: CT HEAD WITHOUT CONTRAST CT CERVICAL SPINE WITHOUT CONTRAST TECHNIQUE: Multidetector CT imaging of the head and cervical spine was performed following the standard protocol without intravenous contrast. Multiplanar CT image reconstructions of the cervical spine were also generated. COMPARISON:  None. FINDINGS: CT HEAD FINDINGS Brain: No evidence of acute infarction, hemorrhage, hydrocephalus, extra-axial collection or mass lesion/mass effect. Atrophy without specific pattern. 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 Vascular: Atherosclerotic calcification. Skull: Normal. Negative for fracture or focal lesion. Sinuses/Orbits: Mild mucosal thickening in the paranasal sinuses. CT CERVICAL SPINE FINDINGS Alignment: Normal Skull base and vertebrae: Negative for acute fracture.  Osteopenia. Soft tissues and spinal canal: No prevertebral fluid or swelling. No visible canal  hematoma. Disc levels: ACDF with solid arthrodesis from C4-C7. 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 Upper chest: Negative IMPRESSION: No evidence of acute intracranial or cervical spine injury. Electronically Signed   By: Monte Fantasia M.D.   On: 05/26/2019 11:11     IMPRESSION AND PLAN:   78 y.o. male with a known history of hypertension, bladder cancer, recent admission to [facility] status post robotic cystectomy with ileal conduit postop day #4 and recently discharged home who presents to the hospital after a fall due to generalized weakness.  1.  Status post fall-secondary to deconditioning and generalized weakness.  Patient had recent bladder surgery with ileal conduit and was recently discharged from the hospital and is significantly deconditioned. - We will gently hydrate the patient with IV fluids, get physical therapy evaluation.  Patient likely needs to be placed in short-term rehab at [facility].  2.  Acute rhabdomyolysis-this is traumatic in nature from his recent fall and patient being on the floor for prolonged period of time. - We will hydrate the patient with IV fluids, follow CKs.  3.  Acute kidney injury-secondary to dehydration.  We will gently hydrate the patient with IV fluids, follow BUN/creatinine urine output.  Hold patient's HCTZ and lisinopril for now.  4.  Hypokalemia-will supplement orally and intravenously and repeat level in the morning. -Check magnesium level.  Hold hydrochlorothiazide.  5.  History of  bladder cancer-patient is status post robotic cystectomy with ileal conduit postop day #5 today. - Continue follow-up with urology and oncology as an outpatient.  6.  Essential hypertension-patient's blood pressures been on the low side.  Hold hydrochlorothiazide, lisinopril, Toprol for now.  7.  Hyperlipidemia-continue atorvastatin.  8.  DVT prophylaxis-continue Lovenox.    All the records are reviewed and case discussed with ED provider. Management plans discussed with  the patient, family and they are in agreement.  CODE STATUS: DNR  TOTAL TIME TAKING CARE OF THIS PATIENT: 45 minutes.    Henreitta Leber M.D on 05/26/2019 at 12:51 PM  Between 7am to 6pm - Pager - [phone]  After 6pm go to [email or web address] - password [email or web address] [facility]  [facility]  Office  [phone]  CC: Primary care physician; Kirk Ruths, MD

## 2019-05-26 NOTE — Progress Notes (Signed)
Katie with [facility] pt logistics called patched Hilliard Clark with  [facility] air care and Tameka receiving RN into phone call. RN to RN report provided to Beau Fanny, at [facility] health care.  No questions at close of report. Sean to follow up with and estimated time of arrival for transport services to pick up pt.

## 2019-05-26 NOTE — ED Provider Notes (Addendum)
[facility] Emergency Department Provider Note    First MD Initiated Contact with Patient 05/26/19 1004     (approximate)  I have reviewed the triage vital signs and the nursing notes.   HISTORY  Chief Complaint Post-op Problem and Fall    HPI Philip [name] is a 78 y.o. male below listed past medical history with recent cystectomy performed at [facility] for bladder cancer discharged home with home health presents after patient had mechanical fall and was unable to get back up due to weakness and deconditioning.  States he was reaching down to grab something lost his balance fell and hit his head there is no LOC.  He was unable to stand.  Was on the ground for the majority of the night and had to crawl to another room to reach cell phone over the course of several hours.  States he is having some discomfort in his knees where he got carpet burn.  Denies any headache.  No abdominal pain.  States he is not on any blood thinners.   States that a drain from the left lower quadrant was removed in the hospital prior to discharge.  States that the urostomy stents are still in the same position as when he was discharged.   Past Medical History:  Diagnosis Date  . Hypertension    History reviewed. No pertinent family history. Past Surgical History:  Procedure Laterality Date  . BACK SURGERY    . CATARACT EXTRACTION    . HERNIA REPAIR    . PORTA CATH INSERTION N/A 01/01/2019   Procedure: PORTA CATH INSERTION;  Surgeon: Algernon Huxley, MD;  Location: [place] [facility];  Service: Cardiovascular;  Laterality: N/A;  . TONSILLECTOMY    . TRANSURETHRAL RESECTION OF BLADDER TUMOR N/A 12/08/2018   Procedure: TRANSURETHRAL RESECTION OF BLADDER TUMOR (TURBT);  Surgeon: Billey Co, MD;  Location: [facility] ORS;  Service: Urology;  Laterality: N/A;   Patient Active Problem List   Diagnosis Date Noted  . AKI (acute kidney injury) ([place]) 03/22/2019  . 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[place]) 12/08/2018   . Hyperglycemia 11/01/2017  . Essential hypertension 05/18/2017  . Healthcare maintenance 05/18/2017  . Hypercholesterolemia 05/18/2017  . 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 Authorizing Provider  aspirin EC 81 MG tablet Take by mouth. 06/16/11   [provider]  atorvastatin (LIPITOR) 20 MG tablet Take 20 mg by mouth daily. 05/02/18   [provider]  lidocaine-prilocaine (EMLA) cream Apply to affected area once 12/26/18   [provider]  lisinopril-hydrochlorothiazide (PRINZIDE,ZESTORETIC) 20-12.5 MG tablet Take 1 tablet by mouth daily.  05/02/18   [provider]  Multiple Vitamins-Minerals (CENTRUM WOMEN) TABS Take 1 tablet by mouth daily.    [provider]  ondansetron (ZOFRAN) 8 MG tablet Take 1 tablet (8 mg total) by mouth 2 (two) times daily as needed. Start on the third day after cisplatin chemotherapy. 12/26/18   Lloyd Huger, MD  prochlorperazine (COMPAZINE) 10 MG tablet Take 1 tablet (10 mg total) by mouth every 6 (six) hours as needed (Nausea or vomiting). 12/26/18   Lloyd Huger, MD  sodium fluoride (DENTAGEL) 1.1 % GEL dental gel 1 ampule. 12/11/18   [provider]  VITAMIN D PO Take 2,000 Units by mouth once a week.  [provider]  Vitamin D, Ergocalciferol, (DRISDOL) 1.25 MG (50000 UT) CAPS capsule Take by mouth.    [provider]    Allergies Patient has no known allergies.    Social History Social History   Tobacco Use  . Smoking status: Former Smoker    Years: 35.00    Types: Cigarettes    Quit date: 11/29/1993    Years since quitting: 25.5  . Smokeless tobacco: Never Used  Substance Use Topics  . Alcohol use: Yes    Comment: a couple of drinks a day all the above  . Drug use: Never    Review of Systems  Patient denies headaches, rhinorrhea, blurry vision, numbness, shortness of breath, chest pain, edema, cough, abdominal pain, nausea, vomiting, diarrhea, dysuria, fevers, rashes or hallucinations unless otherwise stated above in HPI. ____________________________________________   PHYSICAL EXAM:  VITAL SIGNS: Vitals:   05/26/19 1005 05/26/19 1007  BP:  136/64  Pulse: 95   Resp: 20   Temp: (!) 97.5 F (36.4 C)   SpO2: 100%     Constitutional: Alert and oriented.  Eyes: Conjunctivae are normal.  Head: Atraumatic. Nose: No congestion/rhinnorhea. Mouth/Throat: Mucous membranes are moist.   Neck: No stridor. Painless ROM.  Cardiovascular: Normal rate, regular rhythm. Grossly normal heart sounds.  Good peripheral circulation. Respiratory: Normal respiratory effort.  No retractions. Lungs CTAB. Gastrointestinal: Soft and nontender.  Right lower quadrant ileal conduit appears clean dry and intact.  Incision the left port location clean dry intact.  No evidence of cellulitis.  There is some associated bruising from surgical sites but overall well-appearing.  No distention. No abdominal bruits. No CVA tenderness. Genitourinary: deferred Musculoskeletal: Abrasion consistent with rug burn to bilateral knees.  No hip pain.  Nor edema.  No joint effusions. Neurologic:  Normal speech and language. No gross focal neurologic deficits are appreciated. No facial droop Skin:  Skin is warm, dry and intact. No rash noted. Psychiatric: Mood and affect are normal. Speech and behavior are normal.  ____________________________________________   LABS (all labs ordered are listed, but only abnormal results are displayed)  Results for orders placed or performed during the hospital encounter of 05/26/19 (from the past 24 hour(s))  CBC with Differential/Platelet     Status: Abnormal   Collection Time: 05/26/19 10:29 AM  Result Value Ref Range   WBC 8.2 4.0 - 10.5 K/uL   RBC 2.69 (L) 4.22 - 5.81 MIL/uL    Hemoglobin 9.5 (L) 13.0 - 17.0 g/dL   HCT 28.0 (L) 39.0 - 52.0 %   MCV 104.1 (H) 80.0 - 100.0 fL   MCH 35.3 (H) 26.0 - 34.0 pg   MCHC 33.9 30.0 - 36.0 g/dL   RDW 12.1 11.5 - 15.5 %   Platelets 192 150 - 400 K/uL   nRBC 0.0 0.0 - 0.2 %   Neutrophils Relative % 76 %   Neutro Abs 6.3 1.7 - 7.7 K/uL   Lymphocytes Relative 11 %   Lymphs Abs 0.9 0.7 - 4.0 K/uL   Monocytes Relative 12 %   Monocytes Absolute 1.0 0.1 - 1.0 K/uL   Eosinophils Relative 0 %   Eosinophils Absolute 0.0 0.0 - 0.5 K/uL   Basophils Relative 0 %   Basophils Absolute 0.0 0.0 - 0.1 K/uL   Immature Granulocytes 1 %   Abs Immature Granulocytes 0.07 0.00 - 0.07 K/uL  Comprehensive metabolic panel     Status: Abnormal   Collection Time: 05/26/19 10:29 AM  Result Value Ref Range  Sodium 134 (L) 135 - 145 mmol/L   Potassium 3.0 (L) 3.5 - 5.1 mmol/L   Chloride 102 98 - 111 mmol/L   CO2 20 (L) 22 - 32 mmol/L   Glucose, Bld 109 (H) 70 - 99 mg/dL   BUN 29 (H) 8 - 23 mg/dL   Creatinine, Ser 1.32 (H) 0.61 - 1.24 mg/dL   Calcium 8.7 (L) 8.9 - 10.3 mg/dL   Total Protein 6.2 (L) 6.5 - 8.1 g/dL   Albumin 3.0 (L) 3.5 - 5.0 g/dL   AST 50 (H) 15 - 41 U/L   ALT 24 0 - 44 U/L   Alkaline Phosphatase 51 38 - 126 U/L   Total Bilirubin 0.8 0.3 - 1.2 mg/dL   GFR calc non Af Amer 51 (L) >60 mL/min   GFR calc Af Amer 59 (L) >60 mL/min   Anion gap 12 5 - 15  CK     Status: Abnormal   Collection Time: 05/26/19 10:29 AM  Result Value Ref Range   Total CK 1,761 (H) 49 - 397 U/L   ____________________________________________  EKG My review and personal interpretation at Time: 10:08   Indication: fall  Rate: 85  Rhythm: afib Axis: normal Other: no stemi ____________________________________________  RADIOLOGY I personally reviewed all radiographic images ordered to evaluate for the above acute complaints and reviewed radiology reports and findings.  These findings were personally discussed with the patient.  Please see medical  record for radiology report.  ____________________________________________   PROCEDURES  Procedure(s) performed:  Procedures    Critical Care performed: no ____________________________________________   INITIAL IMPRESSION / ASSESSMENT AND PLAN / ED COURSE  Pertinent labs & imaging results that were available during my care of the patient were reviewed by me and considered in my medical decision making (see chart for details).   DDX: Electrolyte abnormality, dehydration, sepsis, anemia, deconditioning, sepsis, SDH, SAH, ED H  Thaniel Coluccio is a 78 y.o. who presents to the ED with generalized weakness and inability to stand after mechanical fall last night.  Does have some abrasions to his knees but exam is otherwise reassuring also has a small abrasion to his forehead.  Blood will be sent for blood differential.  Ileal conduit stents appear intact and nondisplaced.  Clinical Course as of May 26 1123  Sat May 26, 2019  1121 Patient with evidence of dehydration and elevated CK consistent with rhabdo.  Will start IV fluids.  Abdominal exam soft and benign.  Not consistent with sepsis.   [PR]    Clinical Course User Index [PR] Merlyn Lot, MD    The patient was evaluated in Emergency Department today for the symptoms described in the history of present illness. He/she was evaluated in the context of the global COVID-19 pandemic, which necessitated consideration that the patient might be at risk for infection with the SARS-CoV-2 virus that causes COVID-19. Institutional protocols and algorithms that pertain to the evaluation of patients at risk for COVID-19 are in a state of rapid change based on information released by regulatory bodies including the CDC and federal and state organizations. These policies and algorithms were followed during the patient's care in the ED.  As part of my medical decision making, I reviewed the following data within the [place] notes reviewed and incorporated, Labs reviewed, notes from prior ED visits and [place] Controlled Substance Database   ____________________________________________   FINAL CLINICAL IMPRESSION(S) / ED DIAGNOSES  Final diagnoses:  Weakness  Non-traumatic rhabdomyolysis  NEW MEDICATIONS STARTED DURING THIS VISIT:  New Prescriptions   No medications on file     Note:  This document was prepared using Dragon voice recognition software and may include unintentional dictation errors.    Merlyn Lot, MD 05/26/19 [place]    Merlyn Lot, MD 05/26/19 1236

## 2019-05-26 NOTE — ED Notes (Signed)
Patient transported to CT 

## 2019-05-26 NOTE — ED Notes (Signed)
ED TO INPATIENT HANDOFF REPORT  ED Nurse Name and Phone #:   S Name/Age/Gender Philip [name] 78 y.o. male Room/Bed: ED16A/ED16A  Code Status   Code Status: Prior  Home/SNF/Other Home Patient oriented to: self, place, time and situation Is this baseline? Yes   Triage Complete: Triage complete  Chief Complaint Fall  Triage Note Pt arrives [facility] from home Dc yesterday from [facility]. had surgery to remove bladder for bladder fell during night. States he bent over to pick something up and toppled over, hit head. Has abrasion to head. Denies LOC. Multiple incision sites appear closed. L side wound open. bag has come loose as well. was sliding on floor to get to phone, has skinned knees from that. VSS, CBG 119.    [phone] daughter    Allergies No Known Allergies  Level of Care/Admitting Diagnosis ED Disposition    ED Disposition Condition [place] [facility] Area: [place] [[id number]]  Level of Care: Med-Surg [16]  Covid Evaluation: Person Under Investigation (PUI)  Isolation Risk Level: Low Risk/Droplet (Less than 4L [place] supplementation)  Diagnosis: Dehydration [276.51.ICD-9-CM]  Admitting Physician: Henreitta Leber [[id number]]  Attending Physician: Henreitta Leber [[id number]]  PT Class (Do Not Modify): Observation [104]  PT Acc Code (Do Not Modify): Observation [10022]       B Medical/Surgery History Past Medical History:  Diagnosis Date  . 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[place])   . Hypertension    Past Surgical History:  Procedure Laterality Date  . BACK SURGERY    . CATARACT EXTRACTION    . HERNIA REPAIR    . PORTA CATH INSERTION N/A 01/01/2019   Procedure: PORTA CATH INSERTION;  Surgeon: Algernon Huxley, MD;  Location: [place] [facility];  Service: Cardiovascular;  Laterality: N/A;  . TONSILLECTOMY    . TRANSURETHRAL RESECTION OF BLADDER TUMOR N/A 12/08/2018   Procedure: TRANSURETHRAL RESECTION OF BLADDER TUMOR (TURBT);  Surgeon: Billey Co, MD;   Location: [facility] ORS;  Service: Urology;  Laterality: N/A;     A IV Location/Drains/Wounds Patient Lines/Drains/Airways Status   Active Line/Drains/Airways    Name:   Placement date:   Placement time:   Site:   Days:   Implanted Port Right Chest   -    -    Chest      Urethral Catheter dr sninsky Double-lumen;Straight-tip;Latex 20 Fr.   12/08/18    1349    Double-lumen;Straight-tip;Latex   169   Incision (Closed) 12/08/18 Penis   12/08/18    1412     169          Intake/Output Last 24 hours  Intake/Output Summary (Last 24 hours) at 05/26/2019 1406 Last data filed at 05/26/2019 1152 Gross per 24 hour  Intake 250 ml  Output -  Net 250 ml    Labs/Imaging Results for orders placed or performed during the hospital encounter of 05/26/19 (from the past 48 hour(s))  CBC with Differential/Platelet     Status: Abnormal   Collection Time: 05/26/19 10:29 AM  Result Value Ref Range   WBC 8.2 4.0 - 10.5 K/uL   RBC 2.69 (L) 4.22 - 5.81 MIL/uL   Hemoglobin 9.5 (L) 13.0 - 17.0 g/dL   HCT 28.0 (L) 39.0 - 52.0 %   MCV 104.1 (H) 80.0 - 100.0 fL   MCH 35.3 (H) 26.0 - 34.0 pg   MCHC 33.9 30.0 - 36.0 g/dL   RDW 12.1 11.5 - 15.5 %   Platelets 192 150 -  400 K/uL   nRBC 0.0 0.0 - 0.2 %   Neutrophils Relative % 76 %   Neutro Abs 6.3 1.7 - 7.7 K/uL   Lymphocytes Relative 11 %   Lymphs Abs 0.9 0.7 - 4.0 K/uL   Monocytes Relative 12 %   Monocytes Absolute 1.0 0.1 - 1.0 K/uL   Eosinophils Relative 0 %   Eosinophils Absolute 0.0 0.0 - 0.5 K/uL   Basophils Relative 0 %   Basophils Absolute 0.0 0.0 - 0.1 K/uL   Immature Granulocytes 1 %   Abs Immature Granulocytes 0.07 0.00 - 0.07 K/uL    Comment: Performed at [facility], [address] [place], [place] [address]  Comprehensive metabolic panel     Status: Abnormal   Collection Time: 05/26/19 10:29 AM  Result Value Ref Range   Sodium 134 (L) 135 - 145 mmol/L   Potassium 3.0 (L) 3.5 - 5.1 mmol/L   Chloride 102 98 - 111 mmol/L   CO2  20 (L) 22 - 32 mmol/L   Glucose, Bld 109 (H) 70 - 99 mg/dL   BUN 29 (H) 8 - 23 mg/dL   Creatinine, Ser 1.32 (H) 0.61 - 1.24 mg/dL   Calcium 8.7 (L) 8.9 - 10.3 mg/dL   Total Protein 6.2 (L) 6.5 - 8.1 g/dL   Albumin 3.0 (L) 3.5 - 5.0 g/dL   AST 50 (H) 15 - 41 U/L   ALT 24 0 - 44 U/L   Alkaline Phosphatase 51 38 - 126 U/L   Total Bilirubin 0.8 0.3 - 1.2 mg/dL   GFR calc non Af Amer 51 (L) >60 mL/min   GFR calc Af Amer 59 (L) >60 mL/min   Anion gap 12 5 - 15    Comment: Performed at [facility], [address] [place], [place] [address]  CK     Status: Abnormal   Collection Time: 05/26/19 10:29 AM  Result Value Ref Range   Total CK 1,761 (H) 49 - 397 U/L    Comment: Performed at [facility], [address] [place],  [address]  SARS Coronavirus 2 (CEPHEID - Performed in [place] hospital lab), Hosp Order     Status: None   Collection Time: 05/26/19 11:30 AM   Specimen: Nasopharyngeal Swab  Result Value Ref Range   SARS Coronavirus 2 NEGATIVE NEGATIVE    Comment: (NOTE) If result is NEGATIVE SARS-CoV-2 target nucleic acids are NOT DETECTED. The SARS-CoV-2 RNA is generally detectable in upper and lower  respiratory specimens during the acute phase of infection. The lowest  concentration of SARS-CoV-2 viral copies this assay can detect is 250  copies / mL. A negative result does not preclude SARS-CoV-2 infection  and should not be used as the sole basis for treatment or other  patient management decisions.  A negative result may occur with  improper specimen collection / handling, submission of specimen other  than nasopharyngeal swab, presence of viral mutation(s) within the  areas targeted by this assay, and inadequate number of viral copies  (<250 copies / mL). A negative result must be combined with clinical  observations, patient history, and epidemiological information. If result is POSITIVE SARS-CoV-2 target nucleic acids are DETECTED. The  SARS-CoV-2 RNA is generally detectable in upper and lower  respiratory specimens dur ing the acute phase of infection.  Positive  results are indicative of active infection with SARS-CoV-2.  Clinical  correlation with patient history and other diagnostic information is  necessary to determine patient  infection status.  Positive results do  not rule out bacterial infection or co-infection with other viruses. If result is PRESUMPTIVE POSTIVE SARS-CoV-2 nucleic acids MAY BE PRESENT.   A presumptive positive result was obtained on the submitted specimen  and confirmed on repeat testing.  While 2019 novel coronavirus  (SARS-CoV-2) nucleic acids may be present in the submitted sample  additional confirmatory testing may be necessary for epidemiological  and / or clinical management purposes  to differentiate between  SARS-CoV-2 and other Sarbecovirus currently known to infect humans.  If clinically indicated additional testing with an alternate test  methodology [phone]) is advised. The SARS-CoV-2 RNA is generally  detectable in upper and lower respiratory sp ecimens during the acute  phase of infection. The expected result is Negative. Fact Sheet for Patients:  StrictlyIdeas.no Fact Sheet for Healthcare Providers: BankingDealers.co.za This test is not yet approved or cleared by the [place] FDA and has been authorized for detection and/or diagnosis of SARS-CoV-2 by FDA under an Emergency Use Authorization (EUA).  This EUA will remain in effect (meaning this test can be used) for the duration of the COVID-19 declaration under Section 564(b)(1) of the Act, 21 U.S.C. section 360bbb-3(b)(1), unless the authorization is terminated or revoked sooner. Performed at [facility], [address], [place], [place] [address]   Magnesium     Status: None   Collection Time: 05/26/19 12:54 PM  Result Value Ref Range   Magnesium 2.0 1.7 -  2.4 mg/dL    Comment: Performed at [facility], [address] [place],  [address]   Ct Head Wo Contrast  Result Date: 05/26/2019 CLINICAL DATA:  Head trauma EXAM: CT HEAD WITHOUT CONTRAST CT CERVICAL SPINE WITHOUT CONTRAST TECHNIQUE: Multidetector CT imaging of the head and cervical spine was performed following the standard protocol without intravenous contrast. Multiplanar CT image reconstructions of the cervical spine were also generated. COMPARISON:  None. FINDINGS: CT HEAD FINDINGS Brain: No evidence of acute infarction, hemorrhage, hydrocephalus, extra-axial collection or mass lesion/mass effect. Atrophy without specific pattern. 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 Vascular: Atherosclerotic calcification. Skull: Normal. Negative for fracture or focal lesion. Sinuses/Orbits: Mild mucosal thickening in the paranasal sinuses. CT CERVICAL SPINE FINDINGS Alignment: Normal Skull base and vertebrae: Negative for acute fracture.  Osteopenia. Soft tissues and spinal canal: No prevertebral fluid or swelling. No visible canal hematoma. Disc levels: ACDF with solid arthrodesis from C4-C7. 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 Upper chest: Negative IMPRESSION: No evidence of acute intracranial or cervical spine injury. Electronically Signed   By: Monte Fantasia M.D.   On: 05/26/2019 11:11   Ct Cervical Spine Wo Contrast  Result Date: 05/26/2019 CLINICAL DATA:  Head trauma EXAM: CT HEAD WITHOUT CONTRAST CT CERVICAL SPINE WITHOUT CONTRAST TECHNIQUE: Multidetector CT imaging of the head and cervical spine was performed following the standard protocol without intravenous contrast. Multiplanar CT image reconstructions of the cervical spine were also generated. COMPARISON:  None. FINDINGS: CT HEAD FINDINGS Brain: No evidence of acute infarction, hemorrhage, hydrocephalus, extra-axial collection or mass lesion/mass effect. Atrophy without specific  pattern. 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 Vascular: Atherosclerotic calcification. Skull: Normal. Negative for fracture or focal lesion. Sinuses/Orbits: Mild mucosal thickening in the paranasal sinuses. CT CERVICAL SPINE FINDINGS Alignment: Normal Skull base and vertebrae: Negative for acute fracture.  Osteopenia. Soft tissues and spinal canal: No prevertebral fluid or swelling. No visible canal hematoma. Disc  levels: ACDF with solid arthrodesis from C4-C7. 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 Upper chest: Negative IMPRESSION: No evidence of acute intracranial or cervical spine injury. Electronically Signed   By: Monte Fantasia M.D.   On: 05/26/2019 11:11    Pending Labs [employer] (From admission, onward)    Start     Ordered   Signed and Occupational hygienist morning,   R     Signed and Held   Signed and Held  CBC  Tomorrow morning,   R     Signed and Held   Signed and Held  CBC  (enoxaparin (LOVENOX)    CrCl >/= 30 ml/min)  Once,   R    Comments: Baseline for enoxaparin therapy IF NOT ALREADY DRAWN.  Notify MD if PLT < 100 K.    Signed and Held   Signed and Held  Creatinine, serum  (enoxaparin (LOVENOX)    CrCl >/= 30 ml/min)  Once,   R    Comments: Baseline for enoxaparin therapy IF NOT ALREADY DRAWN.    Signed and Held   Signed and Held  Creatinine, serum  (enoxaparin (LOVENOX)    CrCl >/= 30 ml/min)  Weekly,   R    Comments: while on enoxaparin therapy    Signed and Held   Signed and Held  CK  Tomorrow morning,   R     Signed and Held          Vitals/Pain Today's Vitals   05/26/19 1315 05/26/19 1330 05/26/19 1345 05/26/19 1400  BP:  (!) 141/70  (!) 141/60  Pulse:      Resp: 15 18 18 16   Temp:      TempSrc:      SpO2:      Weight:      Height:      PainSc:        Isolation Precautions No active isolations  Medications Medications  sodium chloride 0.9 % bolus 250 mL (0 mLs Intravenous  Stopped 05/26/19 1152)  0.9 %  sodium chloride infusion ( Intravenous New Bag/Given 05/26/19 1128)    Mobility walks Low fall risk   Focused Assessments urology    R Recommendations: See Admitting Provider Note  Report given to:   Additional Notes:

## 2019-05-26 NOTE — ED Notes (Signed)
Report to [facility] RN receiving nurse.

## 2019-05-29 DIAGNOSIS — I480 Paroxysmal atrial fibrillation: Secondary | ICD-10-CM | POA: Diagnosis not present

## 2019-05-29 DIAGNOSIS — Z8551 Personal history of malignant neoplasm of bladder: Secondary | ICD-10-CM | POA: Diagnosis not present

## 2019-05-29 DIAGNOSIS — R531 Weakness: Secondary | ICD-10-CM | POA: Diagnosis not present

## 2019-05-29 DIAGNOSIS — N183 Chronic kidney disease, stage 3 (moderate): Secondary | ICD-10-CM

## 2019-05-29 DIAGNOSIS — I10 Essential (primary) hypertension: Secondary | ICD-10-CM | POA: Diagnosis not present

## 2019-06-02 NOTE — Progress Notes (Deleted)
[place]  [phone] [phone]) [phone]  ID: Philip [name] OB: 07-25-1941  MR#: [id number]  ULA#:[id number]  Patient Care Team: Kirk Ruths, MD as PCP - General (Internal Medicine)  CHIEF COMPLAINT: Stage II invasive bladder cancer.  INTERVAL HISTORY: Patient returns to clinic today for further evaluation and consideration of cycle 4, day 1 of cisplatin and gemcitabine.  He continues to tolerate his treatments well without significant side effects.  He currently feels well and is asymptomatic. He has no neurologic complaints.  He denies any recent fevers or illnesses.  He has a good appetite and denies weight loss.  He denies any chest pain, cough, shortness of breath, or hemoptysis.  He denies any nausea, vomiting, constipation, or diarrhea.  He has no urinary complaints.  Patient offers no specific complaints today.  REVIEW OF SYSTEMS:   Review of Systems  Constitutional: Negative.  Negative for fever, malaise/fatigue and weight loss.  Respiratory: Negative.  Negative for cough, hemoptysis and shortness of breath.   Cardiovascular: Negative.  Negative for chest pain and leg swelling.  Gastrointestinal: Negative.  Negative for abdominal pain and constipation.  Genitourinary: Negative.  Negative for dysuria and hematuria.  Musculoskeletal: Negative.  Negative for back pain.  Skin: Negative.  Negative for rash.  Neurological: Negative.  Negative for focal weakness, weakness and headaches.  Psychiatric/Behavioral: Negative.  The patient is not nervous/anxious.     As per HPI. Otherwise, a complete review of systems is negative.  PAST MEDICAL HISTORY: Past Medical History:  Diagnosis Date   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[place])    Hypertension     PAST SURGICAL HISTORY: Past Surgical History:  Procedure Laterality Date   BACK SURGERY     CATARACT EXTRACTION     HERNIA REPAIR     PORTA CATH INSERTION N/A 01/01/2019   Procedure: PORTA CATH INSERTION;   Surgeon: Algernon Huxley, MD;  Location: [place] [facility];  Service: Cardiovascular;  Laterality: N/A;   TONSILLECTOMY     TRANSURETHRAL RESECTION OF BLADDER TUMOR N/A 12/08/2018   Procedure: TRANSURETHRAL RESECTION OF BLADDER TUMOR (TURBT);  Surgeon: Billey Co, MD;  Location: [facility] ORS;  Service: Urology;  Laterality: N/A;    FAMILY HISTORY: Family History  Problem Relation Age of Onset   Heart failure Mother     ADVANCED DIRECTIVES (Y/N):  N  HEALTH MAINTENANCE: Social History   Tobacco Use   Smoking status: Former Smoker    Years: 35.00    Types: Cigarettes    Quit date: 11/29/1993    Years since quitting: 25.5   Smokeless tobacco: Never Used  Substance Use Topics   Alcohol use: Yes    Comment: a couple of drinks a day all the above   Drug use: Never     Colonoscopy:  PAP:  Bone density:  Lipid panel:  No Known Allergies  Current Outpatient Medications  Medication Sig Dispense Refill   atorvastatin (LIPITOR) 20 MG tablet Take 20 mg by mouth daily.     celecoxib (CELEBREX) 100 MG capsule Take 100 mg by mouth 2 (two) times daily.     docusate sodium (COLACE) 100 MG capsule Take 100 mg by mouth 2 (two) times daily.     enoxaparin (LOVENOX) 40 MG/0.4ML injection Inject 40 mg into the skin daily.     lisinopril-hydrochlorothiazide (ZESTORETIC) 20-12.5 MG tablet Take 1 tablet by mouth daily.     metoprolol succinate (TOPROL-XL) 25 MG 24 hr tablet Take 25 mg by mouth daily.  Multiple Vitamins-Minerals (CENTRUM WOMEN) TABS Take 1 tablet by mouth daily.     ondansetron (ZOFRAN) 8 MG tablet Take 1 tablet (8 mg total) by mouth 2 (two) times daily as needed. Start on the third day after cisplatin chemotherapy. (Patient not taking: Reported on 05/26/2019) 30 tablet 2   prochlorperazine (COMPAZINE) 10 MG tablet Take 1 tablet (10 mg total) by mouth every 6 (six) hours as needed (Nausea or vomiting). (Patient not taking: Reported on 05/26/2019) 60 tablet 2    senna (SENNA-LAX) 8.6 MG tablet Take 8.6 mg by mouth daily.     Vitamin D, Ergocalciferol, (DRISDOL) 1.25 MG (50000 UT) CAPS capsule Take 50,000 Units by mouth every 7 (seven) days.      No current facility-administered medications for this visit.     OBJECTIVE: There were no vitals filed for this visit.   There is no height or weight on file to calculate BMI.    ECOG FS:0 - Asymptomatic  General: Well-developed, well-nourished, no acute distress. Eyes: Pink conjunctiva, anicteric sclera. HEENT: Normocephalic, moist mucous membranes. Lungs: Clear to auscultation bilaterally. Heart: Regular rate and rhythm. No rubs, murmurs, or gallops. Abdomen: Soft, nontender, nondistended. No organomegaly noted, normoactive bowel sounds. Musculoskeletal: No edema, cyanosis, or clubbing. Neuro: Alert, answering all questions appropriately. Cranial nerves grossly intact. Skin: No rashes or petechiae noted. Psych: Normal affect.  LAB RESULTS:  Lab Results  Component Value Date   NA 134 (L) 05/26/2019   K 3.0 (L) 05/26/2019   CL 102 05/26/2019   CO2 20 (L) 05/26/2019   GLUCOSE 109 (H) 05/26/2019   BUN 29 (H) 05/26/2019   CREATININE 1.32 (H) 05/26/2019   CALCIUM 8.7 (L) 05/26/2019   PROT 6.2 (L) 05/26/2019   ALBUMIN 3.0 (L) 05/26/2019   AST 50 (H) 05/26/2019   ALT 24 05/26/2019   ALKPHOS 51 05/26/2019   BILITOT 0.8 05/26/2019   GFRNONAA 51 (L) 05/26/2019   GFRAA 59 (L) 05/26/2019    Lab Results  Component Value Date   WBC 8.2 05/26/2019   NEUTROABS 6.3 05/26/2019   HGB 9.5 (L) 05/26/2019   HCT 28.0 (L) 05/26/2019   MCV 104.1 (H) 05/26/2019   PLT 192 05/26/2019     STUDIES: Ct Head Wo Contrast  Result Date: 05/26/2019 CLINICAL DATA:  Head trauma EXAM: CT HEAD WITHOUT CONTRAST CT CERVICAL SPINE WITHOUT CONTRAST TECHNIQUE: Multidetector CT imaging of the head and cervical spine was performed following the standard protocol without intravenous contrast. Multiplanar CT image  reconstructions of the cervical spine were also generated. COMPARISON:  None. FINDINGS: CT HEAD FINDINGS Brain: No evidence of acute infarction, hemorrhage, hydrocephalus, extra-axial collection or mass lesion/mass effect. Atrophy without specific pattern. 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 Vascular: Atherosclerotic calcification. Skull: Normal. Negative for fracture or focal lesion. Sinuses/Orbits: Mild mucosal thickening in the paranasal sinuses. CT CERVICAL SPINE FINDINGS Alignment: Normal Skull base and vertebrae: Negative for acute fracture.  Osteopenia. Soft tissues and spinal canal: No prevertebral fluid or swelling. No visible canal hematoma. Disc levels: ACDF with solid arthrodesis from C4-C7. 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 Upper chest: Negative IMPRESSION: No evidence of acute intracranial or cervical spine injury. Electronically Signed   By: Monte Fantasia M.D.   On: 05/26/2019 11:11   Ct Cervical Spine Wo Contrast  Result Date: 05/26/2019 CLINICAL DATA:  Head trauma EXAM: CT HEAD WITHOUT CONTRAST CT CERVICAL SPINE WITHOUT CONTRAST TECHNIQUE: Multidetector CT imaging of the head and cervical spine was  performed following the standard protocol without intravenous contrast. Multiplanar CT image reconstructions of the cervical spine were also generated. COMPARISON:  None. FINDINGS: CT HEAD FINDINGS Brain: No evidence of acute infarction, hemorrhage, hydrocephalus, extra-axial collection or mass lesion/mass effect. Atrophy without specific pattern. 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 Vascular: Atherosclerotic calcification. Skull: Normal. Negative for fracture or focal lesion. Sinuses/Orbits: Mild mucosal thickening in the paranasal sinuses. CT CERVICAL SPINE FINDINGS Alignment: Normal Skull base and vertebrae: Negative for acute fracture.  Osteopenia. Soft tissues and spinal canal:  No prevertebral fluid or swelling. No visible canal hematoma. Disc levels: ACDF with solid arthrodesis from C4-C7. 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 Upper chest: Negative IMPRESSION: No evidence of acute intracranial or cervical spine injury. Electronically Signed   By: Monte Fantasia M.D.   On: 05/26/2019 11:11    ASSESSMENT: Stage II invasive bladder cancer  PLAN:   1.  Stage II invasive bladder cancer: Pathology and imaging results reviewed independently.  PET scan results from January 02, 2019 reviewed independently with no obvious metastatic disease.  Patient will receive neoadjuvant cisplatin and gemcitabine on day 1 with gemcitabine only on day 8.  This will be a 21-day cycle.  Proceed with cycle 4, day 1 today.  Return to clinic in 1 week for further evaluation and consideration of cycle 4, day 8.  Plan to do 4 cycles and then refer back to [facility] for consideration of surgery.  Patient states he has an appointment at [facility] approximately in June, but plans to call to see if this appointment can be moved up. 2.  Neutropenia: Resolved. 3.  Thrombocytopenia: Resolved. 4.  Anemia: Patient's hemoglobin continues to slowly trend down and is now 8.6.  Monitor. 5.  Hyponatremia: Chronic and unchanged.  Sodium is 131 today.  Patient expressed understanding and was in agreement with this plan. He also understands that He can call clinic at any time with any questions, concerns, or complaints.   Cancer Staging Bladder cancer [facility]) Staging form: Urinary Bladder, AJCC 8th Edition - Clinical stage from 12/25/2018: Stage II (cT2, cN0, cM0) - Signed by Lloyd Huger, MD on 12/25/2018   Lloyd Huger, MD   06/02/2019 9:19 AM

## 2019-06-07 ENCOUNTER — Inpatient Hospital Stay: Payer: Medicare Other

## 2019-06-07 ENCOUNTER — Inpatient Hospital Stay: Payer: Medicare Other | Attending: Oncology | Admitting: Oncology

## 2019-06-25 DIAGNOSIS — Z936 Other artificial openings of urinary tract status: Secondary | ICD-10-CM | POA: Insufficient documentation

## 2019-06-28 ENCOUNTER — Other Ambulatory Visit: Payer: Self-pay | Admitting: Internal Medicine

## 2019-06-28 DIAGNOSIS — R748 Abnormal levels of other serum enzymes: Secondary | ICD-10-CM

## 2019-07-04 ENCOUNTER — Ambulatory Visit: Payer: Medicare Other

## 2019-07-05 ENCOUNTER — Ambulatory Visit: Payer: Medicare Other

## 2019-07-06 ENCOUNTER — Other Ambulatory Visit: Payer: Self-pay

## 2019-07-06 ENCOUNTER — Ambulatory Visit
Admission: RE | Admit: 2019-07-06 | Discharge: 2019-07-06 | Disposition: A | Discharge status: Home / Self Care | Payer: Medicare Other | Source: Ambulatory Visit | Attending: Internal Medicine | Admitting: Internal Medicine

## 2019-07-06 DIAGNOSIS — R748 Abnormal levels of other serum enzymes: Secondary | ICD-10-CM | POA: Diagnosis not present

## 2019-07-18 ENCOUNTER — Other Ambulatory Visit: Payer: Self-pay

## 2019-07-19 ENCOUNTER — Inpatient Hospital Stay: Payer: Medicare Other | Attending: Oncology

## 2019-10-31 ENCOUNTER — Telehealth: Payer: Self-pay

## 2019-10-31 NOTE — Telephone Encounter (Signed)
I called spoke with the patient he had surgery back in June, they removed his prostate, bladder and some lymph nodes. He had a CT done on 08/22/19 that showed no signs of cancer and port removed 10/8. Patient has a f/u appointment with his surgeon in 6 months. Advised to contact us if he needed anything.

## 2019-11-02 ENCOUNTER — Telehealth: Payer: Self-pay

## 2019-11-02 NOTE — Telephone Encounter (Signed)
Telephone call to patient to discuss SCP visit.  Patient in agreement for me to mail him the [facility] packet and I will call him on Dec. 18th at 2:00 to review packet and discuss post cancer care.  Packet mailed.

## 2019-11-16 ENCOUNTER — Inpatient Hospital Stay: Payer: Medicare Other | Attending: Oncology | Admitting: Oncology

## 2019-11-16 DIAGNOSIS — C678 Malignant neoplasm of overlapping sites of bladder: Secondary | ICD-10-CM | POA: Diagnosis not present

## 2019-11-16 NOTE — Progress Notes (Signed)
Survivorship Clinic Consult Note [facility]  [phone] [phone]  CLINIC:  Survivorship  REASON FOR VISIT:  Long-term survivorship surveillance visit for patient with history of stage II invasive bladder cancer  Virtual Visit via Telephone Note  I connected with Philip [name] on 11/16/19 at  2:00 PM EST by telephone and verified that I am speaking with the correct person using two identifiers.  Location: Patient: Home Provider: Office   I discussed the limitations, risks, security and privacy concerns of performing an evaluation and management service by telephone and the availability of in person appointments. I also discussed with the patient that there may be a patient responsible charge related to this service. The patient expressed understanding and agreed to proceed.   BRIEF ONCOLOGIC HISTORY:  Oncology History  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[place])  12/08/2018 Initial Diagnosis   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[place])   12/25/2018 Cancer Staging   Staging form: Urinary Bladder, AJCC 8th Edition - Clinical stage from 12/25/2018: Stage II (cT2, cN0, cM0) - Signed by Lloyd Huger, MD on 12/25/2018   01/11/2019 -  Chemotherapy   The patient had palonosetron (ALOXI) injection 0.25 mg, 0.25 mg, Intravenous,  Once, 4 of 4 cycles Administration: 0.25 mg (01/11/2019), 0.25 mg (02/01/2019), 0.25 mg (02/22/2019), 0.25 mg (03/15/2019) CISplatin (PLATINOL) 142 mg in sodium chloride 0.9 % 500 mL chemo infusion, 70 mg/m2 = 142 mg, Intravenous,  Once, 4 of 4 cycles Administration: 142 mg (01/11/2019), 142 mg (02/01/2019), 142 mg (02/22/2019), 142 mg (03/15/2019) gemcitabine (GEMZAR) 2,000 mg in sodium chloride 0.9 % 250 mL chemo infusion, 2,014 mg, Intravenous,  Once, 4 of 4 cycles Administration: 2,000 mg (01/11/2019), 2,000 mg (01/18/2019), 2,000 mg (02/01/2019), 2,000 mg (02/08/2019), 2,000 mg (02/22/2019), 2,000 mg (03/01/2019), 2,000 mg (03/15/2019), 2,000 mg (03/22/2019) fosaprepitant  (EMEND) 150 mg, dexamethasone (DECADRON) 12 mg in sodium chloride 0.9 % 145 mL IVPB, , Intravenous,  Once, 4 of 4 cycles Administration:  (01/11/2019),  (02/01/2019),  (02/22/2019),  (03/15/2019)  for chemotherapy treatment.     INTERVAL HISTORY:   Patient presents today to our survivorship clinic to review cancer treatment and to make sure he has no concerns outstanding.  Patient received 4 cycles of cisplatin/gemcitabine here locally with Dr. Grayland [name].  He was then seen at [facility] by Dr. Tamala Julian for consideration of surgery.  He is status post radical robotic cystectomy with ileal conduit placement on 05/21/2019.  Unfortunately, experienced a fall and was admitted back to the hospital on 05/28/2019 for mild rhabdomyolysis.  He was discharged to a skilled nursing facility but has since recovered and returned home.  Patient was seen by Dr. Idolina Primer and nephrology for moderate left hydronephrosis.  His most recent imaging from 08/22/2019 showed no signs of recurrence, resolution of lymph node enlargement and no hydronephrosis.  He is scheduled to return to [facility] in March 2021 for follow-up.  He has no scheduled follow-up with [place] here locally at our cancer center.  While receiving treatment with [place], he denies any side effects.  He did not have any nausea, vomiting or appetite changes.  ADDITIONAL REVIEW OF SYSTEMS:  Review of Systems  Constitutional: Negative.  Negative for chills, fever, malaise/fatigue and weight loss.  HENT: Negative for congestion, ear pain and tinnitus.   Eyes: Negative.  Negative for blurred vision and double vision.  Respiratory: Negative.  Negative for cough, sputum production and shortness of breath.   Cardiovascular: Negative.  Negative for chest pain, palpitations and leg swelling.  Gastrointestinal: Negative.  Negative for abdominal pain, constipation, diarrhea, nausea and vomiting.  Genitourinary: Negative for dysuria, frequency and urgency.       Ileal conduit in place  Musculoskeletal:  Negative for back pain and falls.  Skin: Negative.  Negative for rash.  Neurological: Negative.  Negative for weakness and headaches.  Endo/Heme/Allergies: Negative.  Does not bruise/bleed easily.  Psychiatric/Behavioral: Negative.  Negative for depression. The patient is not nervous/anxious and does not have insomnia.      PAST MEDICAL & SURGICAL HISTORY:  Past Medical History:  Diagnosis Date  . 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[place])   . Hypertension    Past Surgical History:  Procedure Laterality Date  . BACK SURGERY    . CATARACT EXTRACTION    . HERNIA REPAIR    . PORTA CATH INSERTION N/A 01/01/2019   Procedure: PORTA CATH INSERTION;  Surgeon: Algernon Huxley, MD;  Location: [place] [facility];  Service: Cardiovascular;  Laterality: N/A;  . TONSILLECTOMY    . TRANSURETHRAL RESECTION OF BLADDER TUMOR N/A 12/08/2018   Procedure: TRANSURETHRAL RESECTION OF BLADDER TUMOR (TURBT);  Surgeon: Billey Co, MD;  Location: [facility] ORS;  Service: Urology;  Laterality: N/A;    SOCIAL HISTORY:  None   CURRENT MEDICATIONS:  Current Outpatient Medications on File Prior to Visit  Medication Sig Dispense Refill  . atorvastatin (LIPITOR) 20 MG tablet Take 20 mg by mouth daily.    . celecoxib (CELEBREX) 100 MG capsule Take 100 mg by mouth 2 (two) times daily.    Marland Kitchen enoxaparin (LOVENOX) 40 MG/0.4ML injection Inject 40 mg into the skin daily.    Marland Kitchen lisinopril-hydrochlorothiazide (ZESTORETIC) 20-12.5 MG tablet Take 1 tablet by mouth daily.    . metoprolol succinate (TOPROL-XL) 25 MG 24 hr tablet Take 25 mg by mouth daily.    . Multiple Vitamins-Minerals (CENTRUM WOMEN) TABS Take 1 tablet by mouth daily.    . ondansetron (ZOFRAN) 8 MG tablet Take 1 tablet (8 mg total) by mouth 2 (two) times daily as needed. Start on the third day after cisplatin chemotherapy. (Patient not taking: Reported on 05/26/2019) 30 tablet 2  . prochlorperazine (COMPAZINE) 10 MG tablet Take 1 tablet (10 mg total) by mouth every 6 (six) hours as  needed (Nausea or vomiting). (Patient not taking: Reported on 05/26/2019) 60 tablet 2  . Vitamin D, Ergocalciferol, (DRISDOL) 1.25 MG (50000 UT) CAPS capsule Take 50,000 Units by mouth every 7 (seven) days.      No current facility-administered medications on file prior to visit.    ALLERGIES:  No Known Allergies  PHYSICAL EXAM:  There were no vitals filed for this visit. There were no vitals filed for this visit. Limited due to telephone visit. LABORATORY DATA:  Lab Results  Component Value Date   WBC 8.2 05/26/2019   HGB 9.5 (L) 05/26/2019   HCT 28.0 (L) 05/26/2019   MCV 104.1 (H) 05/26/2019   PLT 192 05/26/2019      Chemistry      Component Value Date/Time   NA 134 (L) 05/26/2019 1029   K 3.0 (L) 05/26/2019 1029   CL 102 05/26/2019 1029   CO2 20 (L) 05/26/2019 1029   BUN 29 (H) 05/26/2019 1029   CREATININE 1.32 (H) 05/26/2019 1029      Component Value Date/Time   CALCIUM 8.7 (L) 05/26/2019 1029   ALKPHOS 51 05/26/2019 1029   AST 50 (H) 05/26/2019 1029   ALT 24 05/26/2019 1029   BILITOT 0.8 05/26/2019 1029  DIAGNOSTIC IMAGING:  Pet 01/02/2019  IMPRESSION: No evidence of bladder cancer metastasis by FDG PET imaging.  ASSESSMENT & PLAN:  Philip [name] is a pleasant 78 y.o. male with history of Stage IIa, treated with 4/4 cycles of Cisplatin/Gemzar; completed treatment on 03/22/19.  Patient presents to survivorship clinic today for survivorship care plan visit and to address any acute survivorship concerns since completing treatment.  Patient had radical robotic cystectomy with ileal conduit placement on 05/21/2023 his urothelial cancer of the bladder with negative margins at [facility].  Experienced a fall 1 week postop and was readmitted for mild rhabdomyolysis.  He was evaluated by nephrology which revealed moderate left hydronephrosis but creatinine was trending down and patient was asymptomatic.  He has home health for his ostomy.  1. History of Stage II Bladder cancer:  Clinically, he is without evidence of disease recurrence based on physical exam/diagnostic imaging.  Today, he received a copy of his survivorship care plan (SCP) document, which was reviewed with him in detail.  The SCP details his cancer treatment history and potential late/long-term side effects of those treatments.  We discussed the follow-up schedule he can anticipate with interval imaging for surveillance of his cancer.  I have also shared a copy of his treatment summary/SCP with his PCP.  Philip [name] will return to the survivorship clinic as needed; he will return to [place] at [facility] for surveillance visit with Dr. Grayland [name] in  months.    2. Problem at visit: None  3. Smoking cessation: I commended Philip [name] continued efforts to remain tobacco-free.  We discussed that one of the most important risk reduction strategies in preventing cancer recurrence in lung cancer patients is smoking cessation.  He is committed to abstaining from tobacco.  4. Physical activity/Healthy eating: Getting adequate physical activity and maintaining a healthy diet as a cancer survivor is important for overall wellness and reduces the risk of cancer recurrence. We discussed the CARE program which is a fitness program that is offered to cancer survivors free of charge.  We also reviewed the American Cancer Society's booklet with recommendations for nutrition and physical activity.    4. Health promotion/Cancer screening:  Philip [name] is reportedly up-to-date on his colonoscopy, PSA tests, skin screenings, and vaccinations.  I encouraged him to talk with his PCP about arranging appropriate cancer screening tests, as appropriate.   5. Support services/Counseling: Philip [name] was seen today in in effort to address both the physical and social concerns of our cancer survivors at [facility]. It is not uncommon for this period of the patient's cancer care trajectory to be one of many  emotions and stressors.  I provided support today through active listening, validation of concerns, and expressive supportive counseling.  Philip [name] was encouraged to take advantage of our support services programs and support groups to better cope in his new life as a cancer survivor after completing anti-cancer treatment.   Dispo:  -Return to survivorship clinic as needed; no additional follow-up needed at this time.  -Consider transitioning the patient to long-term survivorship, when clinically appropriate.   Greater than 50% was spent in counseling and coordination of care with this patient including but not limited to discussion of the relevant topics above (See A&P) including, but not limited to diagnosis and management of acute and chronic medical conditions.   I provided 15 minutes of non face-to-face telephone visit time during this encounter, and > 50% was spent counseling as documented under my  assessment & plan.  Rulon Abide, AGNP-C [place] [facility] [place] (office) 11/16/19 2:35 PM

## 2019-11-16 NOTE — Progress Notes (Signed)
Survivorship Care Plan visit completed.  Treatment summary reviewed and previously mailed to patient.  ASCO answers booklet reviewed and given to patient.  CARE program and Cancer Transitions discussed with patient along with other resources cancer center offers to patients and caregivers.  Patient verbalized understanding. 

## 2020-02-25 DIAGNOSIS — Z9989 Dependence on other enabling machines and devices: Secondary | ICD-10-CM | POA: Insufficient documentation

## 2020-07-02 ENCOUNTER — Other Ambulatory Visit: Payer: Self-pay

## 2020-07-02 ENCOUNTER — Ambulatory Visit (INDEPENDENT_AMBULATORY_CARE_PROVIDER_SITE_OTHER): Payer: Medicare Other | Admitting: Dermatology

## 2020-07-02 DIAGNOSIS — L578 Other skin changes due to chronic exposure to nonionizing radiation: Secondary | ICD-10-CM

## 2020-07-02 DIAGNOSIS — L570 Actinic keratosis: Secondary | ICD-10-CM | POA: Diagnosis not present

## 2020-07-02 DIAGNOSIS — D485 Neoplasm of uncertain behavior of skin: Secondary | ICD-10-CM

## 2020-07-02 NOTE — Progress Notes (Signed)
   Follow-Up Visit   Subjective  Philip [name] is a 79 y.o. male who presents for the following: Actinic Keratosis (check sun exposed areas for new or persistent lesions). He also has other spots to check.  The following portions of the chart were reviewed this encounter and updated as appropriate:  Tobacco  Allergies  Meds  Problems  Med Hx  Surg Hx  Fam Hx     Review of Systems:  No other skin or systemic complaints except as noted in HPI or Assessment and Plan.  Objective  Well appearing patient in no apparent distress; mood and affect are within normal limits.  A focused examination was performed including face, scalp, arms, and hands. Relevant physical exam findings are noted in the Assessment and Plan.  Objective  scalp (2): Erythematous thin papules/macules with gritty scale.   Objective  R mid to inf ear helix: 1.0 cm crusted papule   Assessment & Plan  AK (actinic keratosis) (2) scalp  Destruction of lesion - scalp Complexity: simple   Destruction method: cryotherapy   Informed consent: discussed and consent obtained   Timeout:  patient name, date of birth, surgical site, and procedure verified Lesion destroyed using liquid nitrogen: Yes   Region frozen until ice ball extended beyond lesion: Yes   Outcome: patient tolerated procedure well with no complications   Post-procedure details: wound care instructions given    Neoplasm of uncertain behavior of skin R mid to inf ear helix  Skin / nail biopsy Type of biopsy: tangential   Informed consent: discussed and consent obtained   Timeout: patient name, date of birth, surgical site, and procedure verified   Procedure prep:  Patient was prepped and draped in usual sterile fashion Prep type:  Isopropyl alcohol Anesthesia: the lesion was anesthetized in a standard fashion   Anesthetic:  1% lidocaine w/ epinephrine 1-100,000 buffered w/ 8.4% NaHCO3 Instrument used: flexible razor blade   Hemostasis achieved  with: pressure, aluminum chloride and electrodesiccation   Outcome: patient tolerated procedure well   Post-procedure details: sterile dressing applied and wound care instructions given   Dressing type: bandage and petrolatum    Specimen 1 - Surgical pathology Differential Diagnosis: D48.5 r/o BCC vs SCC Check Margins: No 1.0 cm crusted papule  Actinic Damage - diffuse scaly erythematous macules with underlying dyspigmentation - Recommend daily broad spectrum sunscreen SPF 30+ to sun-exposed areas, reapply every 2 hours as needed.  - Call for new or changing lesions.  Return in about 6 months (around 01/02/2021).  Luther Redo, CMA, am acting as scribe for Sarina Ser, MD .  Documentation: I have reviewed the above documentation for accuracy and completeness, and I agree with the above.  Sarina Ser, MD

## 2020-07-02 NOTE — Patient Instructions (Signed)

## 2020-07-03 ENCOUNTER — Encounter: Payer: Self-pay | Admitting: Dermatology

## 2020-07-09 ENCOUNTER — Telehealth: Payer: Self-pay

## 2020-07-09 NOTE — Telephone Encounter (Signed)
-----   Message from Ralene Bathe, MD sent at 07/09/2020 11:16 AM EDT ----- Skin , right mid to inf ear helix BASAL CELL CARCINOMA, NODULAR PATTERN, ULCERATED  Cancer - BCC Schedule surgery

## 2020-07-09 NOTE — Telephone Encounter (Signed)
Left message for patient to call office for results/hd 

## 2020-07-15 ENCOUNTER — Telehealth: Payer: Self-pay

## 2020-07-15 NOTE — Telephone Encounter (Signed)
Patient informed and already scheduled for surgery.

## 2020-07-15 NOTE — Telephone Encounter (Signed)
-----   Message from Ralene Bathe, MD sent at 07/09/2020 11:16 AM EDT ----- Skin , right mid to inf ear helix BASAL CELL CARCINOMA, NODULAR PATTERN, ULCERATED  Cancer - BCC Schedule surgery

## 2020-08-26 ENCOUNTER — Ambulatory Visit (INDEPENDENT_AMBULATORY_CARE_PROVIDER_SITE_OTHER): Payer: Medicare Other | Admitting: Dermatology

## 2020-08-26 ENCOUNTER — Other Ambulatory Visit: Payer: Self-pay

## 2020-08-26 ENCOUNTER — Encounter: Payer: Self-pay | Admitting: Dermatology

## 2020-08-26 ENCOUNTER — Telehealth: Payer: Self-pay

## 2020-08-26 DIAGNOSIS — C44212 Basal cell carcinoma of skin of right ear and external auricular canal: Secondary | ICD-10-CM | POA: Diagnosis not present

## 2020-08-26 MED ORDER — MUPIROCIN 2 % EX OINT: 1.0000 "application " | TOPICAL_OINTMENT | Freq: Every day | CUTANEOUS | 0 refills | Status: DC

## 2020-08-26 NOTE — Progress Notes (Signed)
   Follow-Up Visit   Subjective  Philip [name] is a 79 y.o. male who presents for the following: Basal Cell Carcinoma (Biopsy proven of right mid to inf helix - [facility] today). The following portions of the chart were reviewed this encounter and updated as appropriate:  Tobacco  Allergies  Meds  Problems  Med Hx  Surg Hx  Fam Hx     Review of Systems:  No other skin or systemic complaints except as noted in HPI or Assessment and Plan.  Objective  Well appearing patient in no apparent distress; mood and affect are within normal limits.  A focused examination was performed including left ear. Relevant physical exam findings are noted in the Assessment and Plan.  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 Right Mid to Inf Ear Helix  Skin excision  Lesion length (cm):  1.5 Lesion width (cm):  1.5 Margin per side (cm):  0.3 Total excision diameter (cm):  2.1 Informed consent: discussed and consent obtained   Timeout: patient name, date of birth, surgical site, and procedure verified   Procedure prep:  Patient was prepped and draped in usual sterile fashion Prep type:  Isopropyl alcohol and povidone-iodine Anesthesia: the lesion was anesthetized in a standard fashion   Anesthetic:  1% lidocaine w/ epinephrine 1-100,000 buffered w/ 8.4% NaHCO3 Instrument used: #15 blade   Hemostasis achieved with: pressure   Hemostasis achieved with comment:  Electrocautery Outcome: patient tolerated procedure well with no complications   Post-procedure details: sterile dressing applied and wound care instructions given   Dressing type: bandage and pressure dressing (mupirocin)   Additional details:  Left to heal by secondary intention  mupirocin ointment (BACTROBAN) 2 %  Specimen 1 - Surgical pathology Differential Diagnosis: Biopsy proven BCC Check Margins: Yes Black ink at 12 oclock sup edge Healing  biopsy site [phone]  Return in about 1 week (around 09/02/2020) for follow up.   I, Ashok Cordia, CMA, am acting as scribe for Sarina Ser, MD .  Documentation: I have reviewed the above documentation for accuracy and completeness, and I agree with the above.  Sarina Ser, MD

## 2020-08-26 NOTE — Telephone Encounter (Signed)
Patient doing fine after today's surgery./sh 

## 2020-08-26 NOTE — Patient Instructions (Signed)

## 2020-09-02 ENCOUNTER — Other Ambulatory Visit: Payer: Self-pay

## 2020-09-02 ENCOUNTER — Ambulatory Visit (INDEPENDENT_AMBULATORY_CARE_PROVIDER_SITE_OTHER): Payer: Medicare Other | Admitting: Dermatology

## 2020-09-02 ENCOUNTER — Encounter: Payer: Self-pay | Admitting: Dermatology

## 2020-09-02 DIAGNOSIS — Z4802 Encounter for removal of sutures: Secondary | ICD-10-CM

## 2020-09-02 DIAGNOSIS — Z85828 Personal history of other malignant neoplasm of skin: Secondary | ICD-10-CM

## 2020-09-02 NOTE — Progress Notes (Signed)
   Follow-Up Visit   Subjective  Philip [name] is a 79 y.o. male who presents for the following: post op (pathology proven margins free BCC - R mid to inf ear helix ).  The following portions of the chart were reviewed this encounter and updated as appropriate:  Tobacco  Allergies  Meds  Problems  Med Hx  Surg Hx  Fam Hx     Review of Systems:  No other skin or systemic complaints except as noted in HPI or Assessment and Plan.  Objective  Well appearing patient in no apparent distress; mood and affect are within normal limits.  A focused examination was performed including the ears. Relevant physical exam findings are noted in the Assessment and Plan.  Objective  R mid to inf ear helix: Healing excision site.  Assessment & Plan  History of basal cell carcinoma (BCC) R mid to inf ear helix  Encounter for Removal of Sutures - Incision site at the R mid to inf ear helix is clean, dry and intact - Wound cleansed, sutures removed, wound cleansed and steri strips applied.  - Discussed pathology results showing margins free basal cell carcinoma - Patient advised to keep steri-strips dry until they fall off. - Scars remodel for a full year. - Once steri-strips fall off, patient can apply over-the-counter silicone scar cream each night to help with scar remodeling if desired. - Patient advised to call with any concerns or if they notice any new or changing lesions.   Return in about 1 month (around 10/03/2020) for excision site recheck .  Luther Redo, CMA, am acting as scribe for Sarina Ser, MD .  Documentation: I have reviewed the above documentation for accuracy and completeness, and I agree with the above.  Sarina Ser, MD

## 2020-09-03 ENCOUNTER — Encounter: Payer: Self-pay | Admitting: Dermatology

## 2020-10-01 ENCOUNTER — Other Ambulatory Visit: Payer: Self-pay

## 2020-10-01 ENCOUNTER — Ambulatory Visit (INDEPENDENT_AMBULATORY_CARE_PROVIDER_SITE_OTHER): Payer: Medicare Other | Admitting: Dermatology

## 2020-10-01 DIAGNOSIS — L578 Other skin changes due to chronic exposure to nonionizing radiation: Secondary | ICD-10-CM | POA: Diagnosis not present

## 2020-10-01 DIAGNOSIS — Z85828 Personal history of other malignant neoplasm of skin: Secondary | ICD-10-CM

## 2020-10-01 NOTE — Progress Notes (Signed)
   Follow-Up Visit   Subjective  Philip [name] is a 79 y.o. male who presents for the following: recheck hx of BCC (R mid to inf ear helix, excissed 08/26/20).  The following portions of the chart were reviewed this encounter and updated as appropriate:  Tobacco  Allergies  Meds  Problems  Med Hx  Surg Hx  Fam Hx     Review of Systems:  No other skin or systemic complaints except as noted in HPI or Assessment and Plan.  Objective  Well appearing patient in no apparent distress; mood and affect are within normal limits.  A focused examination was performed including face, R ear. Relevant physical exam findings are noted in the Assessment and Plan.  Objective  Right mid to inf ear helix: Ear with crust, otherwise clear   Assessment & Plan    Actinic Damage - chronic, secondary to cumulative UV radiation exposure/sun exposure over time - diffuse scaly erythematous macules with underlying dyspigmentation - Recommend daily broad spectrum sunscreen SPF 30+ to sun-exposed areas, reapply every 2 hours as needed.  - Call for new or changing lesions.  History of basal cell carcinoma (BCC) Right mid to inf ear helix  Healing from excision with 2ndary intention  Mechanical debridement today.  Wound cleansed with peroxide, followed by mupirocin and band-aid.   Cont wound care qd until healed  Return for as scheduled 01/15/20 for 54m f/u.  I, Othelia Pulling, RMA, am acting as scribe for Sarina Ser, MD .  Documentation: I have reviewed the above documentation for accuracy and completeness, and I agree with the above.  Sarina Ser, MD

## 2020-10-02 ENCOUNTER — Encounter: Payer: Self-pay | Admitting: Dermatology

## 2020-11-22 IMAGING — CT CT ABD-PEL WO/W CM
3 of 12 series · 12 of 46 positions shown, 18 images · IV contrast (iopamidol)
Comparison: None.

CLINICAL DATA: Gross hematuria starting in [REDACTED]

EXAM:
CT ABDOMEN AND PELVIS WITHOUT AND WITH CONTRAST
TECHNIQUE: Multidetector CT imaging of the abdomen and pelvis was performed
following the standard protocol before and following the bolus
administration of intravenous contrast.
CONTRAST:  125mL [id number] IOPAMIDOL ([id number]) INJECTION 61%

[Series 2: without pre · axial · non-contrast · 0.86mm/px · z∈[-1607,-1282]mm · 6 of 93 slices shown, 11 images]
[im 14/93  soft-tissue]
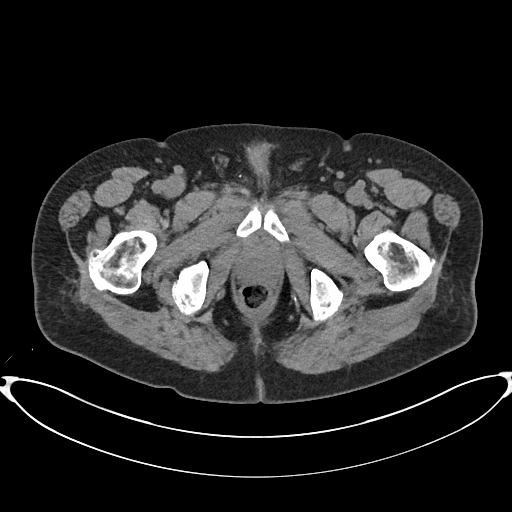
[im 14/93  bone]
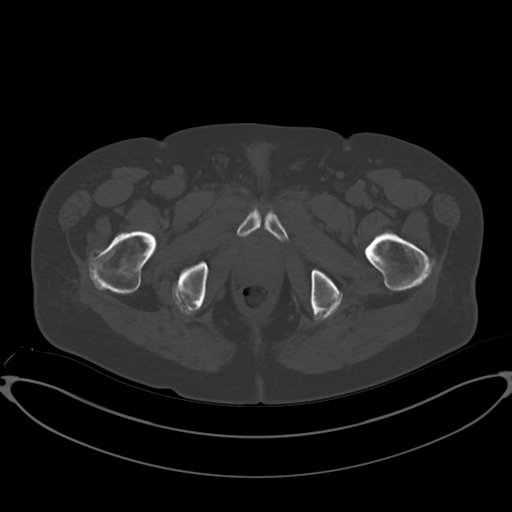
[im 27/93  soft-tissue]
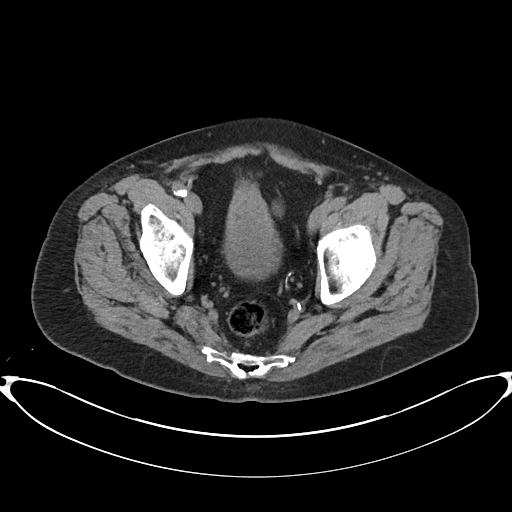
[im 40/93  soft-tissue]
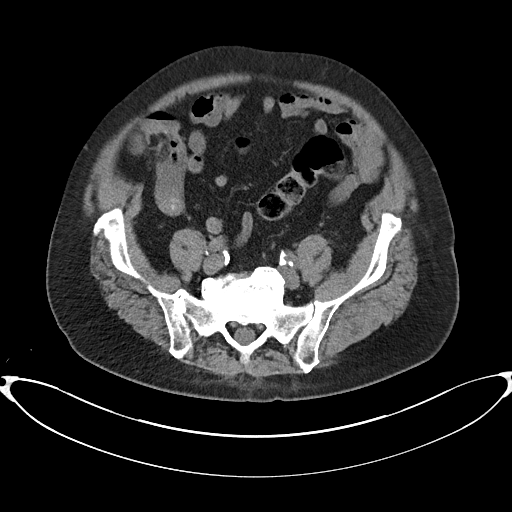
[im 40/93  lung]
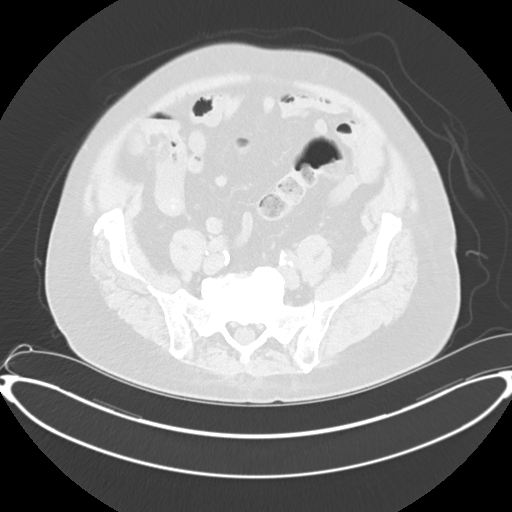
[im 53/93  soft-tissue]
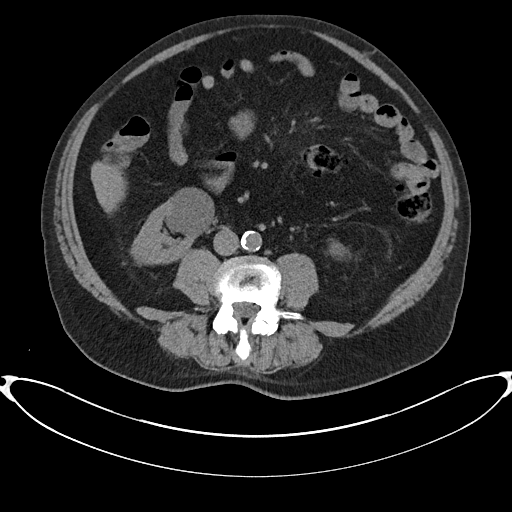
[im 53/93  lung]
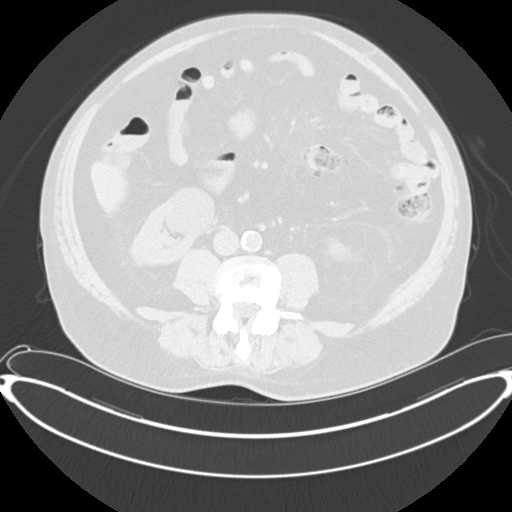
[im 66/93  soft-tissue]
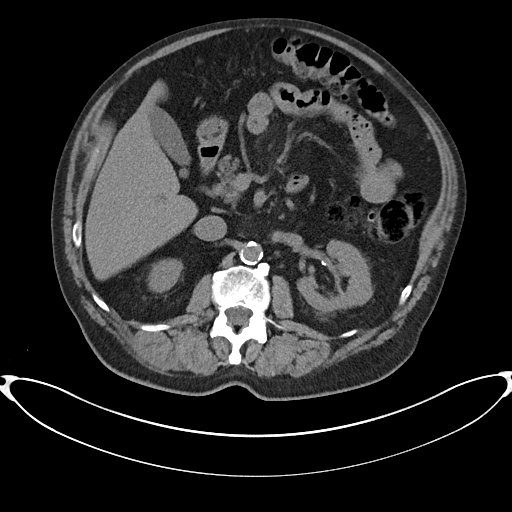
[im 66/93  lung]
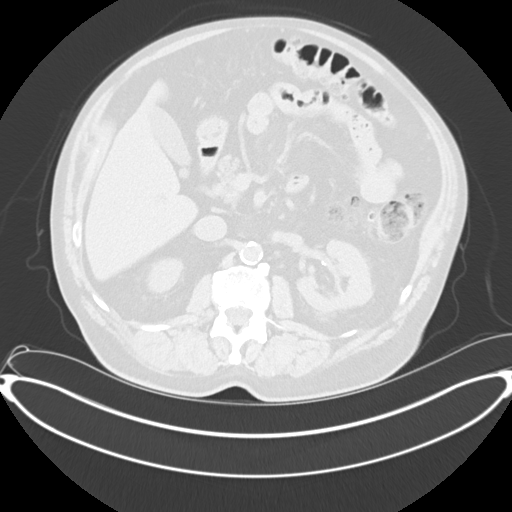
[im 79/93  soft-tissue]
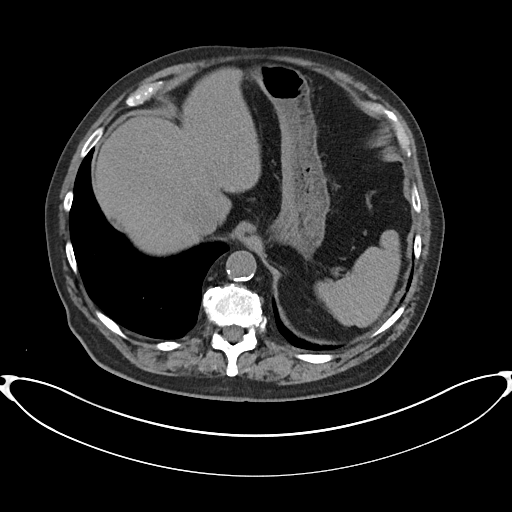
[im 79/93  lung]
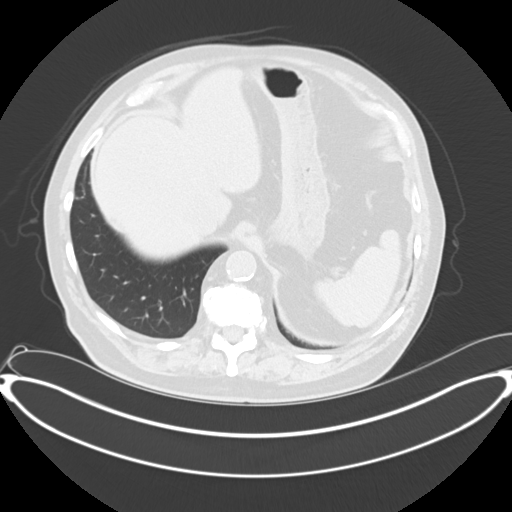

[Series 9: axial with hematuria with · axial · 0.86mm/px · z∈[-1607,-1412]mm · 4 of 93 slices shown]
[im 14/93  soft-tissue]
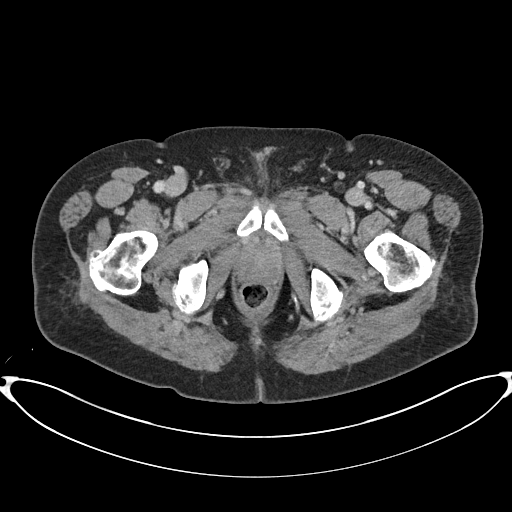
[im 27/93  soft-tissue]
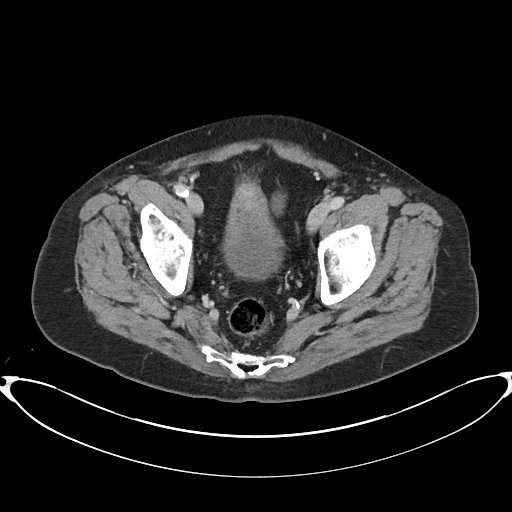
[im 40/93  soft-tissue]
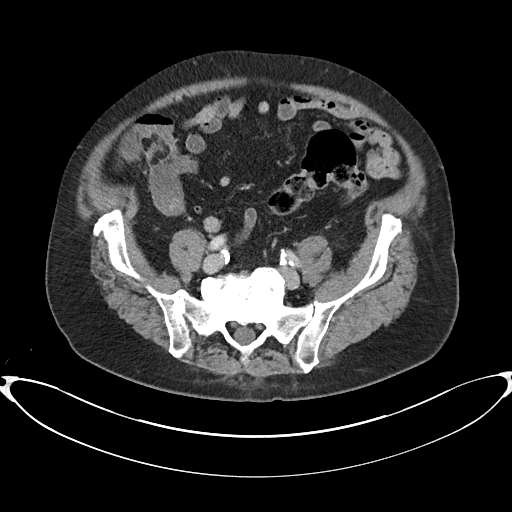
[im 53/93  soft-tissue]
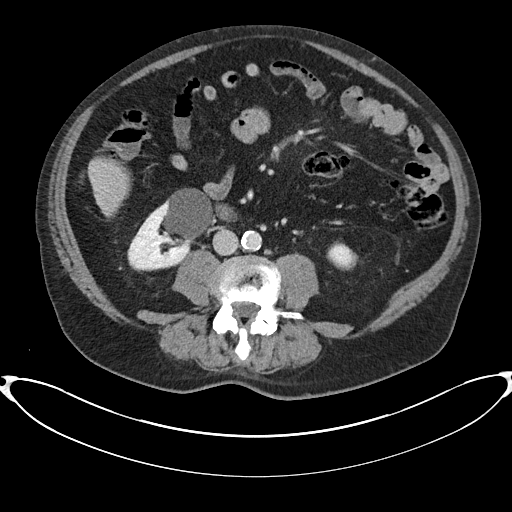

[Series 12: cor with hematuria with · coronal · 0.86mm/px · 2 of 219 slices shown, 3 images]
[im 73/219  soft-tissue]
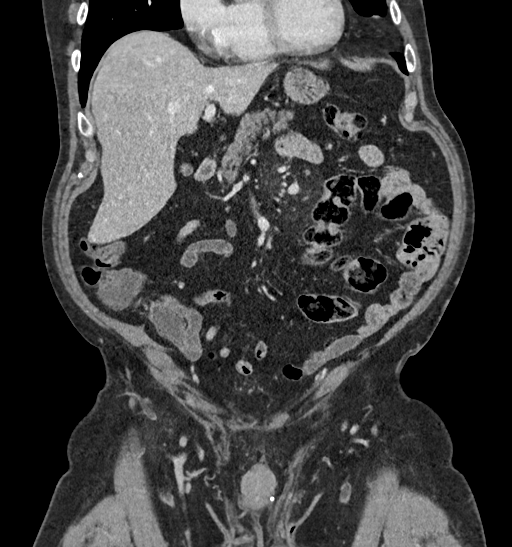
[im 73/219  bone]
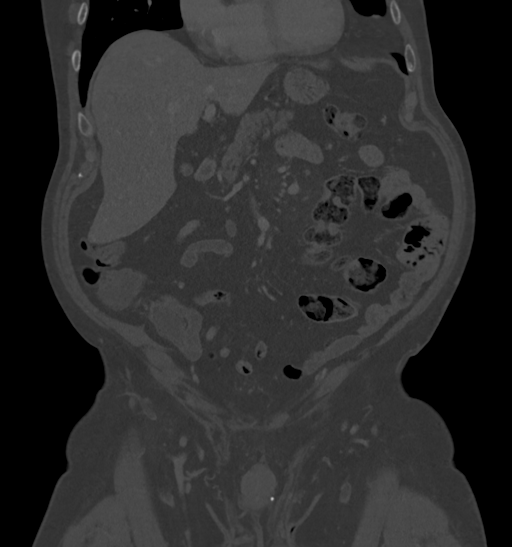
[im 146/219  soft-tissue]
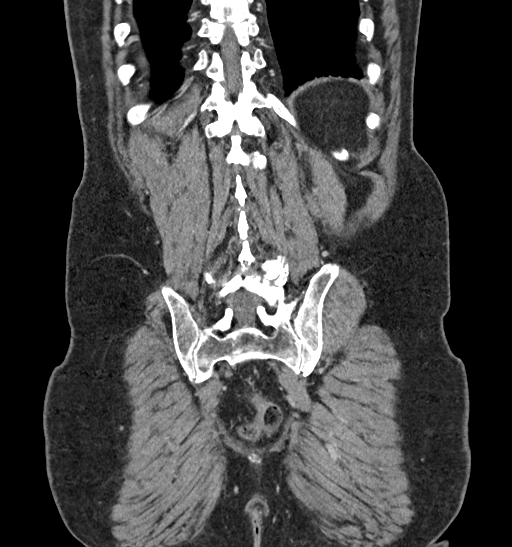

[12 of 46 positions shown; findings below may reference images not displayed]

FINDINGS: Lower chest: Circumflex and right coronary artery atherosclerosis.

Hepatobiliary: Mildly contracted gallbladder. Otherwise
unremarkable.

Pancreas: Unremarkable

Spleen: Unremarkable

Adrenals/Urinary Tract: Bosniak category 1 cyst of the right kidney
lower pole measures 4.2 by 4.3 cm.

Asked other calcifications are present in the left renal hilum.

Adrenal glands normal.

There is abnormal wall thickening in the urinary bladder with
focally convex luminal tumor, the area of abnormal wall thickening
and accentuated enhancement measures approximately 7.1 by 3.4 by
cm and is compatible with urothelial tumor. No other filling defect
along the urothelium identified.

Stomach/Bowel: Unremarkable

Vascular/Lymphatic: Aortoiliac atherosclerotic vascular disease.

Reproductive: The prostate gland measures 5.8 by 4.9 by 5.2 cm
(volume = 77 cm^3) appears heterogeneous. There is nodularity
along the posterior base of the prostate gland adjacent to the
seminal vesicles.

Other: Stranding in the left upper quadrant mesentery likely related
to sclerosing mesenteritis.

Musculoskeletal: Osteochondroma or posttraumatic deformity of the
left anterior superior iliac bone for example on image 60/9. 4 mm of
degenerative grade 1 anterolisthesis at L4-5. Degenerative disc
disease in the lower lumbar spine along with degenerative facet
arthropathy.
IMPRESSION: 1. Abnormal wall thickening anteriorly in the urinary bladder, with
a focally convex component also with some generalized more sessile
wall thickening, total suspected tumor size 7.1 by 3.4 by 3.5 cm,
suspicious for transitional cell carcinoma.
2. Moderate prostate gland enlargement with heterogeneity in some
lobularity. Correlate with PSA level.
3. Benign-appearing cyst of the right kidney lower pole.
4. Mesenteric stranding in the left upper quadrant favoring
sclerosing mesenteritis.
5. Other imaging findings of potential clinical significance: Aortic
Atherosclerosis ([id number]). Coronary atherosclerosis. Lumbar
spondylosis and degenerative disc disease.

## 2021-01-14 ENCOUNTER — Other Ambulatory Visit: Payer: Self-pay

## 2021-01-14 ENCOUNTER — Ambulatory Visit (INDEPENDENT_AMBULATORY_CARE_PROVIDER_SITE_OTHER): Payer: Medicare Other | Admitting: Dermatology

## 2021-01-14 ENCOUNTER — Encounter: Payer: Self-pay | Admitting: Dermatology

## 2021-01-14 ENCOUNTER — Other Ambulatory Visit: Payer: Self-pay | Admitting: Dermatology

## 2021-01-14 DIAGNOSIS — D485 Neoplasm of uncertain behavior of skin: Secondary | ICD-10-CM

## 2021-01-14 DIAGNOSIS — L820 Inflamed seborrheic keratosis: Secondary | ICD-10-CM

## 2021-01-14 DIAGNOSIS — C44311 Basal cell carcinoma of skin of nose: Secondary | ICD-10-CM

## 2021-01-14 DIAGNOSIS — L578 Other skin changes due to chronic exposure to nonionizing radiation: Secondary | ICD-10-CM

## 2021-01-14 DIAGNOSIS — L570 Actinic keratosis: Secondary | ICD-10-CM | POA: Diagnosis not present

## 2021-01-14 NOTE — Progress Notes (Signed)
Follow-Up Visit   Subjective  Philip [name] is a 80 y.o. male who presents for the following: Actinic Keratosis (Of the scalp - check for new or persistent skin lesions). He has a crusted lesion on his nose that has been there for a few months that he would like checked.   The following portions of the chart were reviewed this encounter and updated as appropriate:   Tobacco  Allergies  Meds  Problems  Med Hx  Surg Hx  Fam Hx     Review of Systems:  No other skin or systemic complaints except as noted in HPI or Assessment and Plan.  Objective  Well appearing patient in no apparent distress; mood and affect are within normal limits.  A focused examination was performed including the face and scalp . Relevant physical exam findings are noted in the Assessment and Plan.  Objective  Scalp x 1: Erythematous thin papules/macules with gritty scale.   Objective  Scalp x 1: Erythematous keratotic or waxy stuck-on papule or plaque.   Objective  R nasal alar rim: 0.6 cm crusted papule      Assessment & Plan  AK (actinic keratosis) Scalp x 1  Destruction of lesion - Scalp x 1 Complexity: simple   Destruction method: cryotherapy   Informed consent: discussed and consent obtained   Timeout:  patient name, date of birth, surgical site, and procedure verified Lesion destroyed using liquid nitrogen: Yes   Region frozen until ice ball extended beyond lesion: Yes   Outcome: patient tolerated procedure well with no complications   Post-procedure details: wound care instructions given    Inflamed seborrheic keratosis Scalp x 1  Destruction of lesion - Scalp x 1 Complexity: simple   Destruction method: cryotherapy   Informed consent: discussed and consent obtained   Timeout:  patient name, date of birth, surgical site, and procedure verified Lesion destroyed using liquid nitrogen: Yes   Region frozen until ice ball extended beyond lesion: Yes   Outcome: patient tolerated  procedure well with no complications   Post-procedure details: wound care instructions given    Neoplasm of uncertain behavior of skin R nasal alar rim  Epidermal / dermal shaving  Lesion diameter (cm):  0.6 Informed consent: discussed and consent obtained   Timeout: patient name, date of birth, surgical site, and procedure verified   Procedure prep:  Patient was prepped and draped in usual sterile fashion Prep type:  Isopropyl alcohol Anesthesia: the lesion was anesthetized in a standard fashion   Anesthetic:  1% lidocaine w/ epinephrine 1-100,000 buffered w/ 8.4% NaHCO3 Instrument used: flexible razor blade   Hemostasis achieved with: pressure, aluminum chloride and electrodesiccation   Outcome: patient tolerated procedure well   Post-procedure details: sterile dressing applied and wound care instructions given   Dressing type: bandage and petrolatum    Destruction of lesion Complexity: extensive   Destruction method: electrodesiccation and curettage   Informed consent: discussed and consent obtained   Timeout:  patient name, date of birth, surgical site, and procedure verified Procedure prep:  Patient was prepped and draped in usual sterile fashion Prep type:  Isopropyl alcohol Anesthesia: the lesion was anesthetized in a standard fashion   Anesthetic:  1% lidocaine w/ epinephrine 1-100,000 buffered w/ 8.4% NaHCO3 Curettage performed in three different directions: Yes   Electrodesiccation performed over the curetted area: Yes   Lesion length (cm):  0.6 Lesion width (cm):  0.6 Margin per side (cm):  0.2 Final wound size (cm):  1 Hemostasis  achieved with:  pressure, aluminum chloride and electrodesiccation Outcome: patient tolerated procedure well with no complications   Post-procedure details: sterile dressing applied and wound care instructions given   Dressing type: bandage and petrolatum    Specimen 1 - Surgical pathology Differential Diagnosis: D48.5 r/o BCC  ED&C  today  Check Margins: No 0.6 cm crusted papule   Actinic Damage - chronic, secondary to cumulative UV radiation exposure/sun exposure over time - diffuse scaly erythematous macules with underlying dyspigmentation - Recommend daily broad spectrum sunscreen SPF 30+ to sun-exposed areas, reapply every 2 hours as needed.  - Call for new or changing lesions.  Return in about 6 months (around 07/14/2021).  Luther Redo, CMA, am acting as scribe for Sarina Ser, MD .  Documentation: I have reviewed the above documentation for accuracy and completeness, and I agree with the above.  Sarina Ser, MD

## 2021-01-14 NOTE — Patient Instructions (Signed)

## 2021-01-19 ENCOUNTER — Telehealth: Payer: Self-pay

## 2021-01-19 NOTE — Telephone Encounter (Signed)
Advised pt of bx results/sh ?

## 2021-01-19 NOTE — Telephone Encounter (Signed)
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

## 2021-07-22 ENCOUNTER — Ambulatory Visit (INDEPENDENT_AMBULATORY_CARE_PROVIDER_SITE_OTHER): Payer: Medicare Other | Admitting: Dermatology

## 2021-07-22 ENCOUNTER — Other Ambulatory Visit: Payer: Self-pay

## 2021-07-22 DIAGNOSIS — Z85828 Personal history of other malignant neoplasm of skin: Secondary | ICD-10-CM

## 2021-07-22 DIAGNOSIS — C44319 Basal cell carcinoma of skin of other parts of face: Secondary | ICD-10-CM

## 2021-07-22 DIAGNOSIS — B029 Zoster without complications: Secondary | ICD-10-CM | POA: Diagnosis not present

## 2021-07-22 DIAGNOSIS — L820 Inflamed seborrheic keratosis: Secondary | ICD-10-CM | POA: Diagnosis not present

## 2021-07-22 DIAGNOSIS — C44212 Basal cell carcinoma of skin of right ear and external auricular canal: Secondary | ICD-10-CM

## 2021-07-22 DIAGNOSIS — D692 Other nonthrombocytopenic purpura: Secondary | ICD-10-CM

## 2021-07-22 DIAGNOSIS — L570 Actinic keratosis: Secondary | ICD-10-CM

## 2021-07-22 DIAGNOSIS — L578 Other skin changes due to chronic exposure to nonionizing radiation: Secondary | ICD-10-CM | POA: Diagnosis not present

## 2021-07-22 DIAGNOSIS — D492 Neoplasm of unspecified behavior of bone, soft tissue, and skin: Secondary | ICD-10-CM

## 2021-07-22 DIAGNOSIS — L821 Other seborrheic keratosis: Secondary | ICD-10-CM

## 2021-07-22 MED ORDER — VALACYCLOVIR HCL 1 G PO TABS: 1000.0000 mg | ORAL_TABLET | Freq: Three times a day (TID) | ORAL | 1 refills | Status: AC

## 2021-07-22 NOTE — Patient Instructions (Signed)
Wound Care Instructions  Cleanse wound gently with soap and water once a day then pat dry with clean gauze. Apply a thing coat of Petrolatum (petroleum jelly, "Vaseline") over the wound (unless you have an allergy to this). We recommend that you use a new, sterile tube of Vaseline. Do not pick or remove scabs. Do not remove the yellow or white "healing tissue" from the base of the wound.  Cover the wound with fresh, clean, nonstick gauze and secure with paper tape. You may use Band-Aids in place of gauze and tape if the would is small enough, but would recommend trimming much of the tape off as there is often too much. Sometimes Band-Aids can irritate the skin.  You should call the office for your biopsy report after 1 week if you have not already been contacted.  If you experience any problems, such as abnormal amounts of bleeding, swelling, significant bruising, significant pain, or evidence of infection, please call the office immediately.  FOR ADULT SURGERY PATIENTS: If you need something for pain relief you may take 1 extra strength Tylenol (acetaminophen) AND 2 Ibuprofen (200mg each) together every 4 hours as needed for pain. (do not take these if you are allergic to them or if you have a reason you should not take them.) Typically, you may only need pain medication for 1 to 3 days.   If you have any questions or concerns for your doctor, please call our main line at [phone] and press option 4 to reach your doctor's medical assistant. If no one answers, please leave a voicemail as directed and we will return your call as soon as possible. Messages left after 4 pm will be answered the following business day.   You may also send us a message via MyChart. We typically respond to MyChart messages within 1-2 business days.  For prescription refills, please ask your pharmacy to contact our office. Our fax number is [phone].  If you have an urgent issue when the clinic is closed that  cannot wait until the next business day, you can page your doctor at the number below.    Please note that while we do our best to be available for urgent issues outside of office hours, we are not available 24/7.   If you have an urgent issue and are unable to reach us, you may choose to seek medical care at your doctor's office, retail clinic, urgent care center, or emergency room.  If you have a medical emergency, please immediately call 911 or go to the emergency department.  Pager Numbers  - Dr. Kowalski: [phone]  - Dr. Moye: [phone]  - Dr. Stewart: [phone]  In the event of inclement weather, please call our main line at [phone] for an update on the status of any delays or closures.  Dermatology Medication Tips: Please keep the boxes that topical medications come in in order to help keep track of the instructions about where and how to use these. Pharmacies typically print the medication instructions only on the boxes and not directly on the medication tubes.   If your medication is too expensive, please contact our office at [phone] option 4 or send us a message through MyChart.   We are unable to tell what your co-pay for medications will be in advance as this is different depending on your insurance coverage. However, we may be able to find a substitute medication at lower cost or fill out paperwork to get insurance to cover a needed   medication.   If a prior authorization is required to get your medication covered by your insurance company, please allow us 1-2 business days to complete this process.  Drug prices often vary depending on where the prescription is filled and some pharmacies may offer cheaper prices.  The website www.goodrx.com contains coupons for medications through different pharmacies. The prices here do not account for what the cost may be with help from insurance (it may be cheaper with your insurance), but the website can give you the  price if you did not use any insurance.  - You can print the associated coupon and take it with your prescription to the pharmacy.  - You may also stop by our office during regular business hours and pick up a GoodRx coupon card.  - If you need your prescription sent electronically to a different pharmacy, notify our office through [facility] MyChart or by phone at [phone] option 4.   

## 2021-07-22 NOTE — Progress Notes (Signed)
Follow-Up Visit   Subjective  Philip [name] is a 80 y.o. male who presents for the following: Skin Cancer (6 months f/u right nasal alar rim BCC treated 01-14-21). Pt c/o several spots on his body growing and changing color. Check a rash on the left abdomen and left back appeared 1 month ago, slightly itchy.   The following portions of the chart were reviewed this encounter and updated as appropriate:   Tobacco  Allergies  Meds  Problems  Med Hx  Surg Hx  Fam Hx     Review of Systems:  No other skin or systemic complaints except as noted in HPI or Assessment and Plan.  Objective  Well appearing patient in no apparent distress; mood and affect are within normal limits.  A focused examination was performed including face,chest,back,scalp. Relevant physical exam findings are noted in the Assessment and Plan.  left inferior  cheek 0.7 cm irregular pink papule   right preauricular 1.1 cm pink irregular papule   Scalp, face, ears x 18 (21) Erythematous thin papules/macules with gritty scale.   Left Forearm - Anterior x 1 Erythematous keratotic or waxy stuck-on papule or plaque.   Left back/abdomen Linear pink areas with crust    Assessment & Plan  Neoplasm of skin (2) left inferior  cheek  Epidermal / dermal shaving  Lesion diameter (cm):  0.7 Informed consent: discussed and consent obtained   Timeout: patient name, date of birth, surgical site, and procedure verified   Procedure prep:  Patient was prepped and draped in usual sterile fashion Prep type:  Isopropyl alcohol Anesthesia: the lesion was anesthetized in a standard fashion   Anesthetic:  1% lidocaine w/ epinephrine 1-100,000 buffered w/ 8.4% NaHCO3 Hemostasis achieved with: pressure, aluminum chloride and electrodesiccation   Outcome: patient tolerated procedure well   Post-procedure details: sterile dressing applied and wound care instructions given   Dressing type: bandage and petrolatum    Destruction  of lesion  Destruction method: electrodesiccation and curettage   Informed consent: discussed and consent obtained   Timeout:  patient name, date of birth, surgical site, and procedure verified Anesthesia: the lesion was anesthetized in a standard fashion   Anesthetic:  1% lidocaine w/ epinephrine 1-100,000 buffered w/ 8.4% NaHCO3 Curettage performed in three different directions: Yes   Electrodesiccation performed over the curetted area: Yes   Curettage cycles:  3 Lesion length (cm):  0.7 Lesion width (cm):  0.7 Margin per side (cm):  0.2 Final wound size (cm):  1.1 Hemostasis achieved with:  electrodesiccation Outcome: patient tolerated procedure well with no complications   Post-procedure details: sterile dressing applied and wound care instructions given   Dressing type: petrolatum    Specimen 1 - Surgical pathology Differential Diagnosis: R/O BCC  Check Margins: No  right preauricular  Epidermal / dermal shaving  Lesion diameter (cm):  1.1 Informed consent: discussed and consent obtained   Timeout: patient name, date of birth, surgical site, and procedure verified   Procedure prep:  Patient was prepped and draped in usual sterile fashion Prep type:  Isopropyl alcohol Anesthesia: the lesion was anesthetized in a standard fashion   Anesthetic:  1% lidocaine w/ epinephrine 1-100,000 buffered w/ 8.4% NaHCO3 Hemostasis achieved with: pressure, aluminum chloride and electrodesiccation   Outcome: patient tolerated procedure well   Post-procedure details: sterile dressing applied and wound care instructions given   Dressing type: bandage and petrolatum    Destruction of lesion  Destruction method: electrodesiccation and curettage   Informed consent:  discussed and consent obtained   Timeout:  patient name, date of birth, surgical site, and procedure verified Anesthesia: the lesion was anesthetized in a standard fashion   Anesthetic:  1% lidocaine w/ epinephrine 1-100,000  buffered w/ 8.4% NaHCO3 Curettage performed in three different directions: Yes   Electrodesiccation performed over the curetted area: Yes   Curettage cycles:  3 Lesion length (cm):  1.1 Lesion width (cm):  1.1 Margin per side (cm):  0.2 Final wound size (cm):  1.5 Hemostasis achieved with:  electrodesiccation Outcome: patient tolerated procedure well with no complications   Post-procedure details: sterile dressing applied and wound care instructions given   Dressing type: petrolatum    Specimen 2 - Surgical pathology Differential Diagnosis: R/O BCC  Check Margins: No  AK (actinic keratosis) (21) Scalp, face, ears x 18  Actinic keratoses are precancerous spots that appear secondary to cumulative UV radiation exposure/sun exposure over time. They are chronic with expected duration over 1 year. A portion of actinic keratoses will progress to squamous cell carcinoma of the skin. It is not possible to reliably predict which spots will progress to skin cancer and so treatment is recommended to prevent development of skin cancer.  Recommend daily broad spectrum sunscreen SPF 30+ to sun-exposed areas, reapply every 2 hours as needed.  Recommend staying in the shade or wearing long sleeves, sun glasses (UVA+UVB protection) and wide brim hats (4-inch brim around the entire circumference of the hat). Call for new or changing lesions.   Destruction of lesion - Scalp, face, ears x 18 Complexity: simple   Destruction method: cryotherapy   Informed consent: discussed and consent obtained   Timeout:  patient name, date of birth, surgical site, and procedure verified Lesion destroyed using liquid nitrogen: Yes   Region frozen until ice ball extended beyond lesion: Yes   Outcome: patient tolerated procedure well with no complications   Post-procedure details: wound care instructions given    Inflamed seborrheic keratosis Left Forearm - Anterior x 1  Destruction of lesion - Left Forearm -  Anterior x 1 Complexity: simple   Destruction method: cryotherapy   Informed consent: discussed and consent obtained   Timeout:  patient name, date of birth, surgical site, and procedure verified Lesion destroyed using liquid nitrogen: Yes   Region frozen until ice ball extended beyond lesion: Yes   Outcome: patient tolerated procedure well with no complications   Post-procedure details: wound care instructions given    Herpes zoster without complication Left back/abdomen Start Valtrex 1 gm tablet take tid x 1 week  #21  Related Medications valACYclovir (VALTREX) 1000 MG tablet Take 1 tablet (1,000 mg total) by mouth 3 (three) times daily for 7 days.  Purpura - Chronic; persistent and recurrent.  Treatable, but not curable. - Violaceous macules and patches - Benign - Related to trauma, age, sun damage and/or use of blood thinners, chronic use of topical and/or oral steroids - Observe - Can use OTC arnica containing moisturizer such as Dermend Bruise Formula if desired - Call for worsening or other concerns   Seborrheic Keratoses - Stuck-on, waxy, tan-brown papules and/or plaques  - Benign-appearing - Discussed benign etiology and prognosis. - Observe - Call for any changes  History of Basal Cell Carcinoma of the Skin Right nasal alar rim 2022 - No evidence of recurrence today - Recommend regular full body skin exams - Recommend daily broad spectrum sunscreen SPF 30+ to sun-exposed areas, reapply every 2 hours as needed.  - Call if any  new or changing lesions are noted between office visits   Actinic Damage - chronic, secondary to cumulative UV radiation exposure/sun exposure over time - diffuse scaly erythematous macules with underlying dyspigmentation - Recommend daily broad spectrum sunscreen SPF 30+ to sun-exposed areas, reapply every 2 hours as needed.  - Recommend staying in the shade or wearing long sleeves, sun glasses (UVA+UVB protection) and wide brim hats (4-inch  brim around the entire circumference of the hat). - Call for new or changing lesions.  Return in about 6 months (around 01/22/2022) for Hx of BCC, hx of AKs .  IMarye Round, CMA, am acting as scribe for Sarina Ser, MD .  Documentation: I have reviewed the above documentation for accuracy and completeness, and I agree with the above.  Sarina Ser, MD

## 2021-07-23 ENCOUNTER — Encounter: Payer: Self-pay | Admitting: Dermatology

## 2021-07-27 ENCOUNTER — Telehealth: Payer: Self-pay

## 2021-07-27 NOTE — Telephone Encounter (Signed)
-----   Message from Ralene Bathe, MD sent at 07/27/2021 10:54 AM EDT ----- Diagnosis 1. Skin , left inferior cheek BASAL CELL CARCINOMA, NODULAR PATTERN 2. 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

## 2021-07-27 NOTE — Telephone Encounter (Signed)
Discussed biopsy results with pt  °

## 2022-02-03 ENCOUNTER — Other Ambulatory Visit: Payer: Self-pay

## 2022-02-03 ENCOUNTER — Ambulatory Visit (INDEPENDENT_AMBULATORY_CARE_PROVIDER_SITE_OTHER): Payer: Medicare Other | Admitting: Dermatology

## 2022-02-03 DIAGNOSIS — C441191 Basal cell carcinoma of skin of left upper eyelid, including canthus: Secondary | ICD-10-CM | POA: Diagnosis not present

## 2022-02-03 DIAGNOSIS — D489 Neoplasm of uncertain behavior, unspecified: Secondary | ICD-10-CM

## 2022-02-03 DIAGNOSIS — L570 Actinic keratosis: Secondary | ICD-10-CM | POA: Diagnosis not present

## 2022-02-03 DIAGNOSIS — D492 Neoplasm of unspecified behavior of bone, soft tissue, and skin: Secondary | ICD-10-CM

## 2022-02-03 DIAGNOSIS — L820 Inflamed seborrheic keratosis: Secondary | ICD-10-CM | POA: Diagnosis not present

## 2022-02-03 DIAGNOSIS — L578 Other skin changes due to chronic exposure to nonionizing radiation: Secondary | ICD-10-CM | POA: Diagnosis not present

## 2022-02-03 DIAGNOSIS — Z85828 Personal history of other malignant neoplasm of skin: Secondary | ICD-10-CM | POA: Diagnosis not present

## 2022-02-03 NOTE — Progress Notes (Signed)
? ?Follow-Up Visit ?  ?Subjective  ?Philip [name] is a 81 y.o. male who presents for the following: Follow-up (Patient here today for 6 month follow up. Patient reports some spots at  left eyelid, throat,  left ear, chest, scalp. Patient has history of bcc ). ?The patient has spots, moles and lesions to be evaluated, some may be new or changing and the patient has concerns that these could be cancer. ? ?The following portions of the chart were reviewed this encounter and updated as appropriate:  Tobacco  Allergies  Meds  Problems  Med Hx  Surg Hx  Fam Hx   ?  ?Review of Systems: No other skin or systemic complaints except as noted in HPI or Assessment and Plan. ? ?Objective  ?Well appearing patient in no apparent distress; mood and affect are within normal limits. ? ?A focused examination was performed including face, scalp, ears, left upper eyelid. Relevant physical exam findings are noted in the Assessment and Plan. ? ?Scalp x 10, face and ears x 9 (19) ?Erythematous thin papules/macules with gritty scale.  ? ?left mandible x 1 , left anterior neck x 1 (2) ?Erythematous stuck-on, waxy papule or plaque ? ?Left Upper Eyelid ?1 cm firm papule  ? ? ?Assessment & Plan  ?Actinic keratosis (19) ?Scalp x 10, face and ears x 9 ?Actinic keratoses are precancerous spots that appear secondary to cumulative UV radiation exposure/sun exposure over time. They are chronic with expected duration over 1 year. A portion of actinic keratoses will progress to squamous cell carcinoma of the skin. It is not possible to reliably predict which spots will progress to skin cancer and so treatment is recommended to prevent development of skin cancer. ? ?Recommend daily broad spectrum sunscreen SPF 30+ to sun-exposed areas, reapply every 2 hours as needed.  ?Recommend staying in the shade or wearing long sleeves, sun glasses (UVA+UVB protection) and wide brim hats (4-inch brim around the entire circumference of the hat). ?Call for new  or changing lesions. ? ?Destruction of lesion - Scalp x 10, face and ears x 9 ?Complexity: simple   ?Destruction method: cryotherapy   ?Informed consent: discussed and consent obtained   ?Timeout:  patient name, date of birth, surgical site, and procedure verified ?Lesion destroyed using liquid nitrogen: Yes   ?Region frozen until ice ball extended beyond lesion: Yes   ?Outcome: patient tolerated procedure well with no complications   ?Post-procedure details: wound care instructions given   ?Additional details:  Prior to procedure, discussed risks of blister formation, small wound, skin dyspigmentation, or rare scar following cryotherapy. Recommend Vaseline ointment to treated areas while healing. ? ?Inflamed seborrheic keratosis (2) ?left mandible x 1 , left anterior neck x 1 ?Irritated and bothers patient  ? ?Destruction of lesion - left mandible x 1 , left anterior neck x 1 ?Complexity: simple   ?Destruction method: cryotherapy   ?Informed consent: discussed and consent obtained   ?Timeout:  patient name, date of birth, surgical site, and procedure verified ?Lesion destroyed using liquid nitrogen: Yes   ?Region frozen until ice ball extended beyond lesion: Yes   ?Outcome: patient tolerated procedure well with no complications   ?Post-procedure details: wound care instructions given   ?Additional details:  Prior to procedure, discussed risks of blister formation, small wound, skin dyspigmentation, or rare scar following cryotherapy. Recommend Vaseline ointment to treated areas while healing. ? ?Neoplasm of uncertain behavior ?Left Upper Eyelid ?Skin / nail biopsy ?Type of biopsy: tangential   ?Informed  consent: discussed and consent obtained   ?Timeout: patient name, date of birth, surgical site, and procedure verified   ?Procedure prep:  Patient was prepped and draped in usual sterile fashion ?Prep type:  Isopropyl alcohol ?Anesthesia: the lesion was anesthetized in a standard fashion   ?Anesthetic:  1% lidocaine  w/ epinephrine 1-100,000 buffered w/ 8.4% NaHCO3 ?Instrument used: flexible razor blade   ?Hemostasis achieved with: pressure, aluminum chloride and electrodesiccation   ?Outcome: patient tolerated procedure well   ?Post-procedure details: sterile dressing applied and wound care instructions given   ?Dressing type: petrolatum and bandage   ? ?Specimen 1 - Surgical pathology ?Differential Diagnosis: r/o bcc  ?Check Margins: No ?R/o bcc  ? ?Actinic Damage ?- chronic, secondary to cumulative UV radiation exposure/sun exposure over time ?- diffuse scaly erythematous macules with underlying dyspigmentation ?- Recommend daily broad spectrum sunscreen SPF 30+ to sun-exposed areas, reapply every 2 hours as needed.  ?- Recommend staying in the shade or wearing long sleeves, sun glasses (UVA+UVB protection) and wide brim hats (4-inch brim around the entire circumference of the hat). ?- Call for new or changing lesions. ? ?History of Basal Cell Carcinoma of the Skin ?- No evidence of recurrence today multiple locations see history  ?- Recommend regular full body skin exams ?- Recommend daily broad spectrum sunscreen SPF 30+ to sun-exposed areas, reapply every 2 hours as needed.  ?- Call if any new or changing lesions are noted between office visits ? ?Return for 6 month ak follow up h/o of bcc . ?Garry Heater, CMA, am acting as scribe for Sarina Ser, MD. ?Documentation: I have reviewed the above documentation for accuracy and completeness, and I agree with the above. ? ?Sarina Ser, MD ? ?

## 2022-02-03 NOTE — Patient Instructions (Addendum)
Biopsy Wound Care Instructions  Leave the original bandage on for 24 hours if possible.  If the bandage becomes soaked or soiled before that time, it is OK to remove it and examine the wound.  A small amount of post-operative bleeding is normal.  If excessive bleeding occurs, remove the bandage, place gauze over the site and apply continuous pressure (no peeking) over the area for 30 minutes. If this does not work, please call our clinic as soon as possible or page your doctor if it is after hours.   Once a day, cleanse the wound with soap and water. It is fine to shower. If a thick crust develops you may use a Q-tip dipped into dilute hydrogen peroxide (mix 1:1 with water) to dissolve it.  Hydrogen peroxide can slow the healing process, so use it only as needed.    After washing, apply petroleum jelly (Vaseline) or an antibiotic ointment if your doctor prescribed one for you, followed by a bandage.    For best healing, the wound should be covered with a layer of ointment at all times. If you are not able to keep the area covered with a bandage to hold the ointment in place, this may mean re-applying the ointment several times a day.  Continue this wound care until the wound has healed and is no longer open.   Itching and mild discomfort is normal during the healing process. However, if you develop pain or severe itching, please call our office.   If you have any discomfort, you can take Tylenol (acetaminophen) or ibuprofen as directed on the bottle. (Please do not take these if you have an allergy to them or cannot take them for another reason).  Some redness, tenderness and white or yellow material in the wound is normal healing.  If the area becomes very sore and red, or develops a thick yellow-green material (pus), it may be infected; please notify us.    If you have stitches, return to clinic as directed to have the stitches removed. You will continue wound care for 2-3 days after the stitches  are removed.   Wound healing continues for up to one year following surgery. It is not unusual to experience pain in the scar from time to time during the interval.  If the pain becomes severe or the scar thickens, you should notify the office.    A slight amount of redness in a scar is expected for the first six months.  After six months, the redness will fade and the scar will soften and fade.  The color difference becomes less noticeable with time.  If there are any problems, return for a post-op surgery check at your earliest convenience.  To improve the appearance of the scar, you can use silicone scar gel, cream, or sheets (such as Mederma or Serica) every night for up to one year. These are available over the counter (without a prescription).  Please call our office at [phone] for any questions or concerns.        Seborrheic Keratosis  What causes seborrheic keratoses? Seborrheic keratoses are harmless, common skin growths that first appear during adult life.  As time goes by, more growths appear.  Some people may develop a large number of them.  Seborrheic keratoses appear on both covered and uncovered body parts.  They are not caused by sunlight.  The tendency to develop seborrheic keratoses can be inherited.  They vary in color from skin-colored to gray, brown, or even  black.  They can be either smooth or have a rough, warty surface.   Seborrheic keratoses are superficial and look as if they were stuck on the skin.  Under the microscope this type of keratosis looks like layers upon layers of skin.  That is why at times the top layer may seem to fall off, but the rest of the growth remains and re-grows.    Treatment Seborrheic keratoses do not need to be treated, but can easily be removed in the office.  Seborrheic keratoses often cause symptoms when they rub on clothing or jewelry.  Lesions can be in the way of shaving.  If they become inflamed, they can cause itching,  soreness, or burning.  Removal of a seborrheic keratosis can be accomplished by freezing, burning, or surgery. If any spot bleeds, scabs, or grows rapidly, please return to have it checked, as these can be an indication of a skin cancer.        Actinic keratoses are precancerous spots that appear secondary to cumulative UV radiation exposure/sun exposure over time. They are chronic with expected duration over 1 year. A portion of actinic keratoses will progress to squamous cell carcinoma of the skin. It is not possible to reliably predict which spots will progress to skin cancer and so treatment is recommended to prevent development of skin cancer.  Recommend daily broad spectrum sunscreen SPF 30+ to sun-exposed areas, reapply every 2 hours as needed.  Recommend staying in the shade or wearing long sleeves, sun glasses (UVA+UVB protection) and wide brim hats (4-inch brim around the entire circumference of the hat). Call for new or changing lesions.   If You Need Anything After Your Visit  If you have any questions or concerns for your doctor, please call our main line at [phone] and press option 4 to reach your doctor's medical assistant. If no one answers, please leave a voicemail as directed and we will return your call as soon as possible. Messages left after 4 pm will be answered the following business day.   You may also send [place] a message via [place]. We typically respond to MyChart messages within 1-2 business days.  For prescription refills, please ask your pharmacy to contact our office. Our fax number is [phone].  If you have an urgent issue when the clinic is closed that cannot wait until the next business day, you can page your doctor at the number below.    Please note that while we do our best to be available for urgent issues outside of office hours, we are not available 24/7.   If you have an urgent issue and are unable to reach [place], you may choose to seek medical  care at your doctor's office, retail clinic, urgent care center, or emergency room.  If you have a medical emergency, please immediately call 911 or go to the emergency department.  Pager Numbers  - Dr. Nehemiah Massed: [phone]  - Dr. Laurence Ferrari: [phone]  - Dr. Nicole Kindred: [phone]  In the event of inclement weather, please call our main line at [phone] for an update on the status of any delays or closures.  Dermatology Medication Tips: Please keep the boxes that topical medications come in in order to help keep track of the instructions about where and how to use these. Pharmacies typically print the medication instructions only on the boxes and not directly on the medication tubes.   If your medication is too expensive, please contact our office at [phone] option 4 or  send [place] a message through [place].   We are unable to tell what your co-pay for medications will be in advance as this is different depending on your insurance coverage. However, we may be able to find a substitute medication at lower cost or fill out paperwork to get insurance to cover a needed medication.   If a prior authorization is required to get your medication covered by your insurance company, please allow [place] 1-2 business days to complete this process.  Drug prices often vary depending on where the prescription is filled and some pharmacies may offer cheaper prices.  The website www.goodrx.com contains coupons for medications through different pharmacies. The prices here do not account for what the cost may be with help from insurance (it may be cheaper with your insurance), but the website can give you the price if you did not use any insurance.  - You can print the associated coupon and take it with your prescription to the pharmacy.  - You may also stop by our office during regular business hours and pick up a GoodRx coupon card.  - If you need your prescription sent electronically to a different  pharmacy, notify our office through [facility] or by phone at [phone] option 4.     Si Usted Necesita Algo Despus de Su Visita  Tambin puede enviarnos un mensaje a travs de Pharmacist, community. Por lo general respondemos a los mensajes de MyChart en el transcurso de 1 a 2 das hbiles.  Para renovar recetas, por favor pida a su farmacia que se ponga en contacto con nuestra oficina. Harland Dingwall de fax es [place] [phone].  Si tiene un asunto urgente cuando la clnica est cerrada y que no puede esperar hasta el siguiente da hbil, puede llamar/localizar a su doctor(a) al nmero que aparece a continuacin.   Por favor, tenga en cuenta que aunque hacemos todo lo posible para estar disponibles para asuntos urgentes fuera del horario de [place], no estamos disponibles las 24 horas del da, los 7 das de la [place].   Si tiene un problema urgente y no puede comunicarse con nosotros, puede optar por buscar atencin mdica  en el consultorio de su doctor(a), en una clnica privada, en un centro de atencin urgente o en una sala de emergencias.  Si tiene Engineering geologist, por favor llame inmediatamente al 911 o vaya a la sala de emergencias.  Nmeros de bper  - Dr. Nehemiah Massed: [phone]  - Dra. Moye: [phone]  - Dra. Nicole Kindred: [phone]  En caso de inclemencias del [place], por favor llame a Johnsie Kindred principal al [phone] para una actualizacin sobre el [place] de cualquier retraso o cierre.  Consejos para la medicacin en dermatologa: Por favor, guarde las cajas en las que vienen los medicamentos de uso tpico para ayudarle a seguir las instrucciones sobre dnde y cmo usarlos. Las farmacias generalmente imprimen las instrucciones del medicamento slo en las cajas y no directamente en los tubos del [place].   Si su medicamento es muy caro, por favor, pngase en contacto con Zigmund Daniel llamando al [phone] y presione la opcin 4 o envenos un mensaje a  travs de Pharmacist, community.   No podemos decirle cul ser su copago por los medicamentos por adelantado ya que esto es diferente dependiendo de la cobertura de su seguro. Sin embargo, es posible que podamos encontrar un medicamento sustituto a Electrical engineer un formulario para que el seguro cubra el medicamento que se considera necesario.   Si  se requiere una autorizacin previa para que su compaa de seguros [place] su medicamento, por favor permtanos de 1 a 2 das hbiles para completar este proceso.  Los precios de los medicamentos varan con frecuencia dependiendo del Environmental consultant de dnde se surte la receta y alguna farmacias pueden ofrecer precios ms baratos.  El sitio web www.goodrx.com tiene cupones para medicamentos de Airline pilot. Los precios aqu no tienen en cuenta lo que podra costar con la ayuda del seguro (puede ser ms barato con su seguro), pero el sitio web puede darle el precio si no utiliz Research scientist (physical sciences).  - Puede imprimir el cupn correspondiente y llevarlo con su receta a la farmacia.  - Tambin puede pasar por nuestra oficina durante el horario de atencin regular y Charity fundraiser una tarjeta de cupones de GoodRx.  - Si necesita que su receta se enve electrnicamente a una farmacia diferente, informe a nuestra oficina a travs de MyChart de [place] o por telfono llamando al [phone] y presione la opcin 4.

## 2022-02-07 ENCOUNTER — Encounter: Payer: Self-pay | Admitting: Dermatology

## 2022-02-08 ENCOUNTER — Telehealth: Payer: Self-pay

## 2022-02-08 NOTE — Telephone Encounter (Signed)
-----   Message from Ralene Bathe, MD sent at 02/04/2022  5:12 PM EST ----- ?Diagnosis ?Skin , left upper eyelid ?BASAL CELL CARCINOMA, NODULAR PATTERN ? ?Cancer - BCC ?Schedule surgery ?

## 2022-02-08 NOTE — Telephone Encounter (Signed)
Left message on voicemail returning patient's call.  ?

## 2022-02-08 NOTE — Telephone Encounter (Signed)
Left message on voicemail to return my call. Dr. Alveria Apley next available surgery would be on 03/23/22 at 8:30am.  ?

## 2022-02-11 ENCOUNTER — Telehealth: Payer: Self-pay

## 2022-02-11 NOTE — Telephone Encounter (Signed)
Patient's daughter informed of pathology results and surgery scheduled. Advised daughter to have patient return my call if he has any questions or concerns. ?

## 2022-02-11 NOTE — Telephone Encounter (Signed)
-----   Message from Ralene Bathe, MD sent at 02/04/2022  5:12 PM EST ----- ?Diagnosis ?Skin , left upper eyelid ?BASAL CELL CARCINOMA, NODULAR PATTERN ? ?Cancer - BCC ?Schedule surgery ?

## 2022-04-06 ENCOUNTER — Encounter: Payer: Medicare Other | Admitting: Dermatology

## 2022-05-18 ENCOUNTER — Ambulatory Visit (INDEPENDENT_AMBULATORY_CARE_PROVIDER_SITE_OTHER): Payer: Medicare Other | Admitting: Dermatology

## 2022-05-18 ENCOUNTER — Encounter: Payer: Self-pay | Admitting: Dermatology

## 2022-05-18 DIAGNOSIS — C4492 Squamous cell carcinoma of skin, unspecified: Secondary | ICD-10-CM

## 2022-05-18 DIAGNOSIS — C441191 Basal cell carcinoma of skin of left upper eyelid, including canthus: Secondary | ICD-10-CM

## 2022-05-18 HISTORY — DX: Squamous cell carcinoma of skin, unspecified: C44.92

## 2022-05-18 MED ORDER — MUPIROCIN 2 % EX OINT: 1.0000 | TOPICAL_OINTMENT | Freq: Every day | CUTANEOUS | 0 refills | Status: DC

## 2022-05-18 NOTE — Progress Notes (Signed)
   Follow-Up Visit   Subjective  Philip [name] is a 81 y.o. male who presents for the following: BCC bx proven (L upper eyelid, pt presents for excision).  The following portions of the chart were reviewed this encounter and updated as appropriate:   Tobacco  Allergies  Meds  Problems  Med Hx  Surg Hx  Fam Hx     Review of Systems:  No other skin or systemic complaints except as noted in HPI or Assessment and Plan.  Objective  Well appearing patient in no apparent distress; mood and affect are within normal limits.  A focused examination was performed including face. Relevant physical exam findings are noted in the Assessment and Plan.  L upper eyelid Pink bx site 2.2 x 1.2cm   Assessment & Plan  Basal cell carcinoma (BCC) of skin of left upper eyelid including canthus L upper eyelid  Skin excision  Lesion length (cm):  2.2 Lesion width (cm):  1.2 Margin per side (cm):  0.2 Total excision diameter (cm):  2.6 Informed consent: discussed and consent obtained   Timeout: patient name, date of birth, surgical site, and procedure verified   Procedure prep:  Patient was prepped and draped in usual sterile fashion Prep type:  Isopropyl alcohol (Puracyn) Anesthesia: the lesion was anesthetized in a standard fashion   Anesthetic:  1% lidocaine w/ epinephrine 1-100,000 buffered w/ 8.4% NaHCO3 (4cc lido w/ epi) Instrument used: #15 blade   Hemostasis achieved with: pressure   Hemostasis achieved with comment:  Electrocautery Outcome: patient tolerated procedure well with no complications   Post-procedure details: sterile dressing applied and wound care instructions given   Dressing type: bandage and pressure dressing (Mupirocin)   Additional details:  2ndary intention healing  mupirocin ointment (BACTROBAN) 2 % Apply 1 Application topically daily. Qd to excision site on left upper eyelid  Destruction of lesion Complexity: extensive   Destruction method: electrodesiccation  and curettage   Informed consent: discussed and consent obtained   Timeout:  patient name, date of birth, surgical site, and procedure verified Procedure prep:  Patient was prepped and draped in usual sterile fashion Prep type:  Isopropyl alcohol Anesthesia: the lesion was anesthetized in a standard fashion   Anesthetic:  1% lidocaine w/ epinephrine 1-100,000 buffered w/ 8.4% NaHCO3 Curettage performed in three different directions: Yes   Electrodesiccation performed over the curetted area: Yes   Lesion length (cm):  2.2 Lesion width (cm):  1.2 Margin per side (cm):  0.2 Final wound size (cm):  2.6 Hemostasis achieved with:  pressure, aluminum chloride and electrodesiccation Outcome: patient tolerated procedure well with no complications   Post-procedure details: sterile dressing applied and wound care instructions given   Dressing type: bacitracin and pressure dressing    Specimen 1 - Surgical pathology Differential Diagnosis: Bx proven BCC  Check Margins: yes Pink bx site Simple excision and EDC today [phone]  Bx proven, Simple excision and EDC today Start Mupirocin oint qd to excision site   Return in about 4 weeks (around 06/15/2022) for [facility] simple excision f/u.  I, Othelia Pulling, RMA, am acting as scribe for Sarina Ser, MD . Documentation: I have reviewed the above documentation for accuracy and completeness, and I agree with the above.  Sarina Ser, MD

## 2022-05-18 NOTE — Patient Instructions (Signed)

## 2022-05-19 ENCOUNTER — Telehealth: Payer: Self-pay

## 2022-05-19 NOTE — Telephone Encounter (Signed)
Pt doing fine after yesterdays surgery./sh 

## 2022-05-20 ENCOUNTER — Encounter: Payer: Self-pay | Admitting: Dermatology

## 2022-05-20 ENCOUNTER — Telehealth: Payer: Self-pay

## 2022-05-20 NOTE — Telephone Encounter (Signed)
-----   Message from Ralene Bathe, MD sent at 05/20/2022  1:23 PM EDT ----- Diagnosis Skin (M), left upper eyelid EXCISION, RESIDUAL BASAL CELL CARCINOMA, NODULAR PATTERN, MARGINS FREE -AND MODERATELY DIFFERENTIATED SQUAMOUS CELL CARCINOMA, DEEP MARGIN INVOLVED (2 LESIONS)  1- Cancer - BCC Margins free 2- two lesions of different skin Cancer - SCC in same excision specimen Deep margin involved Area treated with destruction at time of excision and appeared clinically clear.   Will not recommend further treatment at this time and will follow closely for any evidence of recurrence.

## 2022-05-20 NOTE — Telephone Encounter (Signed)
Advised pt of bx results/sh ?

## 2022-06-17 ENCOUNTER — Ambulatory Visit (INDEPENDENT_AMBULATORY_CARE_PROVIDER_SITE_OTHER): Payer: Medicare Other | Admitting: Dermatology

## 2022-06-17 DIAGNOSIS — L570 Actinic keratosis: Secondary | ICD-10-CM | POA: Diagnosis not present

## 2022-06-17 DIAGNOSIS — Z85828 Personal history of other malignant neoplasm of skin: Secondary | ICD-10-CM | POA: Diagnosis not present

## 2022-06-17 DIAGNOSIS — L578 Other skin changes due to chronic exposure to nonionizing radiation: Secondary | ICD-10-CM

## 2022-06-17 NOTE — Progress Notes (Signed)
   Follow-Up Visit   Subjective  Philip [name] is a 81 y.o. male who presents for the following: Follow-up (History of BCC of left upper eyelid - simple excision and EDC). The patient has spots, moles and lesions to be evaluated, some may be new or changing and the patient has concerns that these could be cancer.  The following portions of the chart were reviewed this encounter and updated as appropriate:   Tobacco  Allergies  Meds  Problems  Med Hx  Surg Hx  Fam Hx     Review of Systems:  No other skin or systemic complaints except as noted in HPI or Assessment and Plan.  Objective  Well appearing patient in no apparent distress; mood and affect are within normal limits.  A focused examination was performed including face. Relevant physical exam findings are noted in the Assessment and Plan.  Left Upper Eyelid Well healed excision site  Scalp, left ear (9) Erythematous thin papules/macules with gritty scale.    Assessment & Plan   Actinic Damage - chronic, secondary to cumulative UV radiation exposure/sun exposure over time - diffuse scaly erythematous macules with underlying dyspigmentation - Recommend daily broad spectrum sunscreen SPF 30+ to sun-exposed areas, reapply every 2 hours as needed.  - Recommend staying in the shade or wearing long sleeves, sun glasses (UVA+UVB protection) and wide brim hats (4-inch brim around the entire circumference of the hat). - Call for new or changing lesions.  History of basal cell carcinoma (BCC) Left Upper Eyelid  Clear. Observe for recurrence. Call clinic for new or changing lesions.  Recommend regular skin exams, daily broad-spectrum spf 30+ sunscreen use, and photoprotection.     AK (actinic keratosis) (9) Scalp, left ear  Destruction of lesion - Scalp, left ear Complexity: simple   Destruction method: cryotherapy   Informed consent: discussed and consent obtained   Timeout:  patient name, date of birth, surgical site,  and procedure verified Lesion destroyed using liquid nitrogen: Yes   Region frozen until ice ball extended beyond lesion: Yes   Outcome: patient tolerated procedure well with no complications   Post-procedure details: wound care instructions given     Return in about 6 months (around 12/18/2022) for AK follow up.  I, Ashok Cordia, CMA, am acting as scribe for Sarina Ser, MD . Documentation: I have reviewed the above documentation for accuracy and completeness, and I agree with the above.  Sarina Ser, MD

## 2022-06-17 NOTE — Patient Instructions (Signed)
Cryotherapy Aftercare  Wash gently with soap and water everyday.   Apply Vaseline and Band-Aid daily until healed.     Due to recent changes in healthcare laws, you may see results of your pathology and/or laboratory studies on MyChart before the doctors have had a chance to review them. We understand that in some cases there may be results that are confusing or concerning to you. Please understand that not all results are received at the same time and often the doctors may need to interpret multiple results in order to provide you with the best plan of care or course of treatment. Therefore, we ask that you please give us 2 business days to thoroughly review all your results before contacting the office for clarification. Should we see a critical lab result, you will be contacted sooner.   If You Need Anything After Your Visit  If you have any questions or concerns for your doctor, please call our main line at [phone] and press option 4 to reach your doctor's medical assistant. If no one answers, please leave a voicemail as directed and we will return your call as soon as possible. Messages left after 4 pm will be answered the following business day.   You may also send us a message via MyChart. We typically respond to MyChart messages within 1-2 business days.  For prescription refills, please ask your pharmacy to contact our office. Our fax number is [phone].  If you have an urgent issue when the clinic is closed that cannot wait until the next business day, you can page your doctor at the number below.    Please note that while we do our best to be available for urgent issues outside of office hours, we are not available 24/7.   If you have an urgent issue and are unable to reach us, you may choose to seek medical care at your doctor's office, retail clinic, urgent care center, or emergency room.  If you have a medical emergency, please immediately call 911 or go to the  emergency department.  Pager Numbers  - Dr. Kowalski: [phone]  - Dr. Moye: [phone]  - Dr. Stewart: [phone]  In the event of inclement weather, please call our main line at [phone] for an update on the status of any delays or closures.  Dermatology Medication Tips: Please keep the boxes that topical medications come in in order to help keep track of the instructions about where and how to use these. Pharmacies typically print the medication instructions only on the boxes and not directly on the medication tubes.   If your medication is too expensive, please contact our office at [phone] option 4 or send us a message through MyChart.   We are unable to tell what your co-pay for medications will be in advance as this is different depending on your insurance coverage. However, we may be able to find a substitute medication at lower cost or fill out paperwork to get insurance to cover a needed medication.   If a prior authorization is required to get your medication covered by your insurance company, please allow us 1-2 business days to complete this process.  Drug prices often vary depending on where the prescription is filled and some pharmacies may offer cheaper prices.  The website www.goodrx.com contains coupons for medications through different pharmacies. The prices here do not account for what the cost may be with help from insurance (it may be cheaper with your insurance), but the website can   give you the price if you did not use any insurance.  - You can print the associated coupon and take it with your prescription to the pharmacy.  - You may also stop by our office during regular business hours and pick up a GoodRx coupon card.  - If you need your prescription sent electronically to a different pharmacy, notify our office through [facility] MyChart or by phone at [phone] option 4.     Si Usted Necesita Algo Despus de Su Visita  Tambin puede  enviarnos un mensaje a travs de MyChart. Por lo general respondemos a los mensajes de MyChart en el transcurso de 1 a 2 das hbiles.  Para renovar recetas, por favor pida a su farmacia que se ponga en contacto con nuestra oficina. Nuestro nmero de fax es el [phone].  Si tiene un asunto urgente cuando la clnica est cerrada y que no puede esperar hasta el siguiente da hbil, puede llamar/localizar a su doctor(a) al nmero que aparece a continuacin.   Por favor, tenga en cuenta que aunque hacemos todo lo posible para estar disponibles para asuntos urgentes fuera del horario de oficina, no estamos disponibles las 24 horas del da, los 7 das de la semana.   Si tiene un problema urgente y no puede comunicarse con nosotros, puede optar por buscar atencin mdica  en el consultorio de su doctor(a), en una clnica privada, en un centro de atencin urgente o en una sala de emergencias.  Si tiene una emergencia mdica, por favor llame inmediatamente al 911 o vaya a la sala de emergencias.  Nmeros de bper  - Dr. Kowalski: [phone]  - Dra. Moye: [phone]  - Dra. Stewart: [phone]  En caso de inclemencias del tiempo, por favor llame a nuestra lnea principal al [phone] para una actualizacin sobre el estado de cualquier retraso o cierre.  Consejos para la medicacin en dermatologa: Por favor, guarde las cajas en las que vienen los medicamentos de uso tpico para ayudarle a seguir las instrucciones sobre dnde y cmo usarlos. Las farmacias generalmente imprimen las instrucciones del medicamento slo en las cajas y no directamente en los tubos del medicamento.   Si su medicamento es muy caro, por favor, pngase en contacto con nuestra oficina llamando al [phone] y presione la opcin 4 o envenos un mensaje a travs de MyChart.   No podemos decirle cul ser su copago por los medicamentos por adelantado ya que esto es diferente dependiendo de la cobertura de su seguro.  Sin embargo, es posible que podamos encontrar un medicamento sustituto a menor costo o llenar un formulario para que el seguro cubra el medicamento que se considera necesario.   Si se requiere una autorizacin previa para que su compaa de seguros cubra su medicamento, por favor permtanos de 1 a 2 das hbiles para completar este proceso.  Los precios de los medicamentos varan con frecuencia dependiendo del lugar de dnde se surte la receta y alguna farmacias pueden ofrecer precios ms baratos.  El sitio web www.goodrx.com tiene cupones para medicamentos de diferentes farmacias. Los precios aqu no tienen en cuenta lo que podra costar con la ayuda del seguro (puede ser ms barato con su seguro), pero el sitio web puede darle el precio si no utiliz ningn seguro.  - Puede imprimir el cupn correspondiente y llevarlo con su receta a la farmacia.  - Tambin puede pasar por nuestra oficina durante el horario de atencin regular y recoger una tarjeta de cupones de GoodRx.  -   Si necesita que su receta se enve electrnicamente a una farmacia diferente, informe a nuestra oficina a travs de MyChart de [facility] o por telfono llamando al [phone] y presione la opcin 4.  

## 2022-06-26 ENCOUNTER — Encounter: Payer: Self-pay | Admitting: Dermatology

## 2022-08-10 ENCOUNTER — Ambulatory Visit: Payer: Medicare Other | Admitting: Dermatology

## 2022-11-23 ENCOUNTER — Other Ambulatory Visit: Payer: Self-pay

## 2022-11-23 ENCOUNTER — Emergency Department: Payer: Medicare Other

## 2022-11-23 DIAGNOSIS — N390 Urinary tract infection, site not specified: Secondary | ICD-10-CM | POA: Diagnosis not present

## 2022-11-23 DIAGNOSIS — R296 Repeated falls: Secondary | ICD-10-CM | POA: Diagnosis present

## 2022-11-23 DIAGNOSIS — Z79899 Other long term (current) drug therapy: Secondary | ICD-10-CM | POA: Diagnosis not present

## 2022-11-23 DIAGNOSIS — N1831 Chronic kidney disease, stage 3a: Secondary | ICD-10-CM | POA: Diagnosis not present

## 2022-11-23 DIAGNOSIS — Z1152 Encounter for screening for COVID-19: Secondary | ICD-10-CM | POA: Insufficient documentation

## 2022-11-23 DIAGNOSIS — I129 Hypertensive chronic kidney disease with stage 1 through stage 4 chronic kidney disease, or unspecified chronic kidney disease: Secondary | ICD-10-CM | POA: Diagnosis not present

## 2022-11-23 DIAGNOSIS — E7800 Pure hypercholesterolemia, unspecified: Secondary | ICD-10-CM | POA: Insufficient documentation

## 2022-11-23 DIAGNOSIS — R531 Weakness: Secondary | ICD-10-CM | POA: Diagnosis not present

## 2022-11-23 LAB — URINALYSIS, ROUTINE W REFLEX MICROSCOPIC
Bilirubin Urine: NEGATIVE
Glucose, UA: NEGATIVE mg/dL
Hgb urine dipstick: NEGATIVE
Ketones, ur: NEGATIVE mg/dL
Nitrite: NEGATIVE
Protein, ur: >=300 mg/dL — AB
Specific Gravity, Urine: 1.017 (ref 1.005–1.030)
pH: 8 (ref 5.0–8.0)

## 2022-11-23 LAB — TROPONIN I (HIGH SENSITIVITY): Troponin I (High Sensitivity): 11 ng/L (ref <18)

## 2022-11-23 LAB — BASIC METABOLIC PANEL
Anion gap: 11 (ref 5–15)
BUN: 25 mg/dL — ABNORMAL HIGH (ref 8–23)
CO2: 22 mmol/L (ref 22–32)
Calcium: 9.8 mg/dL (ref 8.9–10.3)
Chloride: 105 mmol/L (ref 98–111)
Creatinine, Ser: 1.21 mg/dL (ref 0.61–1.24)
GFR, Estimated: 59 mL/min — ABNORMAL LOW (ref >60)
Glucose, Bld: 90 mg/dL (ref 70–99)
Potassium: 4.4 mmol/L (ref 3.5–5.1)
Sodium: 138 mmol/L (ref 135–145)

## 2022-11-23 LAB — CBC
HCT: 43 % (ref 39.0–52.0)
Hemoglobin: 13.7 g/dL (ref 13.0–17.0)
MCH: 30.4 pg (ref 26.0–34.0)
MCHC: 31.9 g/dL (ref 30.0–36.0)
MCV: 95.6 fL (ref 80.0–100.0)
Platelets: 482 10*3/uL — ABNORMAL HIGH (ref 150–400)
RBC: 4.5 MIL/uL (ref 4.22–5.81)
RDW: 13.4 % (ref 11.5–15.5)
WBC: 12.6 10*3/uL — ABNORMAL HIGH (ref 4.0–10.5)
nRBC: 0 % (ref 0.0–0.2)

## 2022-11-23 LAB — RESP PANEL BY RT-PCR (RSV, FLU A&B, COVID)  RVPGX2
Influenza A by PCR: NEGATIVE
Influenza B by PCR: NEGATIVE
Resp Syncytial Virus by PCR: NEGATIVE
SARS Coronavirus 2 by RT PCR: NEGATIVE

## 2022-11-23 NOTE — ED Triage Notes (Signed)
Pt presents to ED via [facility] c/o fall. Pt states he had 1 fall tonight and multiple falls before today. Pt states he doesn't feel dizzy or lightheaded, just loses his balance. Pt A&O. Denies, CP, SOB , fever, chills

## 2022-11-23 NOTE — ED Provider Triage Note (Signed)
Emergency Medicine Provider Triage Evaluation Note  Xerxes Agrusa, a 81 y.o. male  was evaluated in triage.  Pt complains of general weakness and recent falls. Patient with a history of HTN, AKI, and bladder cancer presents from his facility for evaluation. He notes that despite use of his Rolator he still experiences balance issues and weakness.  He denies FCS, NVS.  Review of Systems  Positive: Weakness, falls Negative: FCS, NVD  Physical Exam  There were no vitals taken for this visit. Gen:   Awake, no distress  NAD Resp:  Normal effort CTA MSK:   Moves extremities without difficulty  Other:    Medical Decision Making  Medically screening exam initiated at 10:29 PM.  Appropriate orders placed.  Karnell Vanderloop was informed that the remainder of the evaluation will be completed by another provider, this initial triage assessment does not replace that evaluation, and the importance of remaining in the ED until their evaluation is complete.  Geriatric patient to the ED for evaluation of general weakness and multiple falls over the last two months.   Melvenia Needles, PA-C 11/23/22 2232

## 2022-11-24 ENCOUNTER — Emergency Department: Payer: Medicare Other

## 2022-11-24 ENCOUNTER — Inpatient Hospital Stay: Payer: Medicare Other

## 2022-11-24 ENCOUNTER — Observation Stay
Admission: EM | Admit: 2022-11-24 | Discharge: 2022-11-26 | Disposition: A | Discharge status: Skilled Nursing Facility | Payer: Medicare Other | Attending: Internal Medicine | Admitting: Internal Medicine

## 2022-11-24 DIAGNOSIS — R531 Weakness: Principal | ICD-10-CM

## 2022-11-24 DIAGNOSIS — E7800 Pure hypercholesterolemia, unspecified: Secondary | ICD-10-CM | POA: Diagnosis not present

## 2022-11-24 DIAGNOSIS — Y92009 Unspecified place in unspecified non-institutional (private) residence as the place of occurrence of the external cause: Secondary | ICD-10-CM

## 2022-11-24 DIAGNOSIS — S32020A Wedge compression fracture of second lumbar vertebra, initial encounter for closed fracture: Secondary | ICD-10-CM | POA: Diagnosis not present

## 2022-11-24 DIAGNOSIS — I10 Essential (primary) hypertension: Secondary | ICD-10-CM | POA: Diagnosis present

## 2022-11-24 DIAGNOSIS — W19XXXA Unspecified fall, initial encounter: Secondary | ICD-10-CM | POA: Diagnosis not present

## 2022-11-24 DIAGNOSIS — N1831 Chronic kidney disease, stage 3a: Secondary | ICD-10-CM | POA: Diagnosis present

## 2022-11-24 DIAGNOSIS — N390 Urinary tract infection, site not specified: Secondary | ICD-10-CM | POA: Diagnosis not present

## 2022-11-24 DIAGNOSIS — N3000 Acute cystitis without hematuria: Secondary | ICD-10-CM

## 2022-11-24 DIAGNOSIS — R296 Repeated falls: Secondary | ICD-10-CM

## 2022-11-24 HISTORY — DX: Wedge compression fracture of second lumbar vertebra, initial encounter for closed fracture: S32.020A

## 2022-11-24 MED ORDER — METHOCARBAMOL 500 MG PO TABS: 500.0000 mg | ORAL_TABLET | Freq: Three times a day (TID) | ORAL | Status: DC | PRN

## 2022-11-24 MED ORDER — LIDOCAINE 5 % EX PTCH
1.0000 | MEDICATED_PATCH | CUTANEOUS | Status: DC
  Administered 2022-11-24 – 2022-11-26 (×3): 1 via TRANSDERMAL
  Filled 2022-11-24 (×3): qty 1

## 2022-11-24 MED ORDER — ENOXAPARIN SODIUM 40 MG/0.4ML IJ SOSY
40.0000 mg | PREFILLED_SYRINGE | INTRAMUSCULAR | Status: DC
  Administered 2022-11-24 – 2022-11-26 (×3): 40 mg via SUBCUTANEOUS
  Filled 2022-11-24 (×3): qty 0.4

## 2022-11-24 MED ORDER — POLYETHYLENE GLYCOL 3350 17 G PO PACK
17.0000 g | PACK | Freq: Every day | ORAL | Status: DC
  Administered 2022-11-24: 17 g via ORAL
  Filled 2022-11-24 (×2): qty 1

## 2022-11-24 MED ORDER — HYDRALAZINE HCL 20 MG/ML IJ SOLN: 5.0000 mg | INTRAMUSCULAR | Status: DC | PRN

## 2022-11-24 MED ORDER — SODIUM CHLORIDE 0.9 % IV SOLN
1.0000 g | Freq: Once | INTRAVENOUS | Status: AC
  Administered 2022-11-24: 1 g via INTRAVENOUS
  Filled 2022-11-24: qty 10

## 2022-11-24 MED ORDER — SODIUM CHLORIDE 0.9 % IV SOLN
1.0000 g | INTRAVENOUS | Status: DC
  Administered 2022-11-25: 1 g via INTRAVENOUS
  Filled 2022-11-24: qty 1

## 2022-11-24 MED ORDER — ADULT MULTIVITAMIN W/MINERALS CH
1.0000 | ORAL_TABLET | Freq: Every day | ORAL | Status: DC
  Administered 2022-11-25 – 2022-11-26 (×2): 1 via ORAL
  Filled 2022-11-24 (×2): qty 1

## 2022-11-24 MED ORDER — SENNOSIDES-DOCUSATE SODIUM 8.6-50 MG PO TABS: 1.0000 | ORAL_TABLET | Freq: Every evening | ORAL | Status: DC | PRN

## 2022-11-24 MED ORDER — ACETAMINOPHEN 325 MG PO TABS: 650.0000 mg | ORAL_TABLET | Freq: Four times a day (QID) | ORAL | Status: DC | PRN

## 2022-11-24 MED ORDER — SODIUM CHLORIDE 0.9 % IV SOLN: INTRAVENOUS | Status: DC

## 2022-11-24 MED ORDER — ONDANSETRON HCL 4 MG/2ML IJ SOLN: 4.0000 mg | Freq: Three times a day (TID) | INTRAMUSCULAR | Status: DC | PRN

## 2022-11-24 MED ORDER — ATORVASTATIN CALCIUM 20 MG PO TABS
20.0000 mg | ORAL_TABLET | Freq: Every day | ORAL | Status: DC
  Administered 2022-11-24 – 2022-11-26 (×3): 20 mg via ORAL
  Filled 2022-11-24 (×2): qty 1

## 2022-11-24 MED ORDER — OXYCODONE-ACETAMINOPHEN 5-325 MG PO TABS: 1.0000 | ORAL_TABLET | ORAL | Status: DC | PRN

## 2022-11-24 NOTE — ED Notes (Signed)
Pt gone to xray

## 2022-11-24 NOTE — Evaluation (Signed)
Physical Therapy Evaluation Patient Details Name: Philip [name] MRN: [id number] DOB: 1941-05-21 Today's Date: 11/24/2022  History of Present Illness  Pt is an 81 y/o M admitted on 11/23/22 after presenting with c/o unsteady gait & frequent falls over the past 3 days. Imaging shows L2 superior endplate compression fx, suspected to be acute. PMH: HTN, bladder CA s/p urostomy, CKD  Clinical Impression  Pt seen for PT evaluation with pt agreeable, daughter present in room. Prior to admission pt was mod I with QC, still driving, cooking but has experienced recent decline with pt falling 3 times in the past 3 days, requiring 15 minutes to get OOB, and decreased balance with prolonged sitting.  PT educates pt on back precautions & orders for back brace but none noted in room so evaluation limited to sitting EOB. Pt requires min assist to complete sidelying>sitting with HOB elevated & cuing for technique. Pt demonstrates R lateral lean in sitting but is able to correct with cuing. At this time, pt is unsafe to d/c home alone. Recommend STR upon d/c to maximize independence with functional mobility & reduce fall risk prior to return home.    Recommendations for follow up therapy are one component of a multi-disciplinary discharge planning process, led by the attending physician.  Recommendations may be updated based on patient status, additional functional criteria and insurance authorization.  Follow Up Recommendations Skilled nursing-short term rehab (<3 hours/day) Can patient physically be transported by private vehicle: No    Assistance Recommended at Discharge Intermittent Supervision/Assistance  Patient can return home with the following  A little help with walking and/or transfers;A little help with bathing/dressing/bathroom;Assistance with cooking/housework;Assist for transportation;Help with stairs or ramp for entrance    Equipment Recommendations None recommended by PT  Recommendations for  Other Services       Functional Status Assessment Patient has had a recent decline in their functional status and demonstrates the ability to make significant improvements in function in a reasonable and predictable amount of time.     Precautions / Restrictions Precautions Precautions: Fall;Back Precaution Booklet Issued: No Required Braces or Orthoses: Spinal Brace Spinal Brace: Thoracolumbosacral orthotic;Applied in sitting position Restrictions Weight Bearing Restrictions: No      Mobility  Bed Mobility Overal bed mobility: Needs Assistance Bed Mobility: Sidelying to Sit   Sidelying to sit: Min assist, HOB elevated       General bed mobility comments: Pt requires cuing for log rolling technique, extra time to upright trunk (daughter reports it took pt 15 minutes to get OOB yesterday morning at home on his own).    Transfers Overall transfer level:  (not attempted 2/2 no brace in room)                      Ambulation/Gait                  Stairs            Wheelchair Mobility    Modified Rankin (Stroke Patients Only)       Balance Overall balance assessment: Needs assistance Sitting-balance support: Feet supported, Bilateral upper extremity supported Sitting balance-Leahy Scale: Fair Sitting balance - Comments: supervision static sitting, is able to sit with BUE in lap but with R lateral lean at times. Postural control: Right lateral lean  Pertinent Vitals/Pain Pain Assessment Pain Assessment: Faces Faces Pain Scale: Hurts little more Pain Location: posterior L thigh Pain Descriptors / Indicators: Discomfort Pain Intervention(s): Monitored during session    Home Living Family/patient expects to be discharged to:: Private residence Living Arrangements: Alone   Type of Home: House Home Access: Level entry       Home Layout: One level Home Equipment: Rollator (4 wheels);Cane -  quad      Prior Function Prior Level of Function : Independent/Modified Independent;Driving             Mobility Comments: Pt is mod I with QD, driving at baseline but has been experiencing a decline (daughter reports pt has had 3 falls in the past 3 days, fatigues quickly as pt with lateral lean in sitting after opening Christmas gifts). ADLs Comments: Pt cooks for himself, has housekeeper come every 2 weeks.     Hand Dominance        Extremity/Trunk Assessment   Upper Extremity Assessment Upper Extremity Assessment: Generalized weakness    Lower Extremity Assessment Lower Extremity Assessment: Generalized weakness (3/5 BLE knee extension in sitting)    Cervical / Trunk Assessment Cervical / Trunk Assessment: Normal  Communication   Communication: No difficulties  Cognition Arousal/Alertness: Awake/alert Behavior During Therapy: WFL for tasks assessed/performed Overall Cognitive Status: Within Functional Limits for tasks assessed                                          General Comments      Exercises     Assessment/Plan    PT Assessment Patient needs continued PT services  PT Problem List Decreased strength;Pain;Decreased activity tolerance;Decreased balance;Decreased mobility;Decreased knowledge of use of DME;Decreased knowledge of precautions       PT Treatment Interventions DME instruction;Therapeutic exercise;Balance training;Neuromuscular re-education;Functional mobility training;Therapeutic activities;Patient/family education;Gait training    PT Goals (Current goals can be found in the Care Plan section)  Acute Rehab PT Goals Patient Stated Goal: get better PT Goal Formulation: With patient Time For Goal Achievement: 12/08/22 Potential to Achieve Goals: Good    Frequency 7X/week     Co-evaluation               AM-PAC PT "6 Clicks" Mobility  Outcome Measure Help needed turning from your back to your side while in a flat  bed without using bedrails?: A Little Help needed moving from lying on your back to sitting on the side of a flat bed without using bedrails?: A Lot Help needed moving to and from a bed to a chair (including a wheelchair)?: A Lot Help needed standing up from a chair using your arms (e.g., wheelchair or bedside chair)?: A Lot Help needed to walk in hospital room?: A Lot Help needed climbing 3-5 steps with a railing? : A Lot 6 Click Score: 13    End of Session   Activity Tolerance: Patient tolerated treatment well Patient left: in bed;with bed alarm set (sitting EOB with daughter & granddaughter in room, NT bringing call bell to pt) Nurse Communication: Mobility status;Precautions PT Visit Diagnosis: Difficulty in walking, not elsewhere classified (R26.2);Muscle weakness (generalized) (M62.81)    Time: 5053-9767 PT Time Calculation (min) (ACUTE ONLY): 22 min   Charges:   PT Evaluation $PT Eval Moderate Complexity: Wray, PT, DPT 11/24/22, 2:30 PM  Waunita Schooner 11/24/2022, 2:28 PM

## 2022-11-24 NOTE — Care Management Obs Status (Signed)
[facility] NOTIFICATION   Patient Details  Name: Philip [name] MRN: [id number] Date of Birth: Dec 03, 1940   Medicare Observation Status Notification Given:  Yes    Beverly Sessions, RN 11/24/2022, 5:00 PM

## 2022-11-24 NOTE — TOC Initial Note (Signed)
Transition of Care [facility]) - Initial/Assessment Note    Patient Details  Name: Philip [name] MRN: [id number] Date of Birth: 06/21/1941  [facility] CM/SW Contact:    Beverly Sessions, RN Phone Number: 11/24/2022, 5:01 PM  Clinical Narrative:                  Toc assessment complete full note to follow        Patient Goals and CMS Choice            Expected Discharge Plan and Services                                              Prior Living Arrangements/Services                       Activities of Daily Living      Permission Sought/Granted                  Emotional Assessment              Admission diagnosis:  UTI (urinary tract infection) [N39.0] Urinary tract infection [N39.0] Acute UTI [N39.0] Generalized weakness [R53.1] Frequent falls [R29.6] Patient Active Problem List   Diagnosis Date Noted   Urinary tract infection 11/24/2022   Fall at home, initial encounter 11/24/2022   Compression fracture of L2 ([place]) 11/24/2022   Chronic kidney disease, 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[place]) 11/24/2022   UTI (urinary tract infection) 11/24/2022   Dehydration 05/26/2019   AKI (acute kidney injury) ([place]) 03/22/2019   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[place]) 12/08/2018   Hyperglycemia 11/01/2017   Essential hypertension 05/18/2017   Healthcare maintenance 05/18/2017   Hypercholesterolemia 05/18/2017   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

## 2022-11-24 NOTE — H&P (Signed)
History and Physical    Philip [name] XBM:[id number] DOB: 1941/04/26 DOA: 11/24/2022  Referring MD/NP/PA:   PCP: Kirk Ruths, MD   Patient coming from:  The patient is coming from independent living facility   Chief Complaint: fall  HPI: Philip [name] is a 81 y.o. male with medical history significant of hypertension, hyperlipidemia, CKD-3A, skin cancer, bladder cancer, s/p of her urostomy, who presents with fall.  Per his daughter at the bedside, patient has chronic intermittent fall for a long time. In the past several weeks, patient falls more frequently.  He has poor balance.  Denies lightheadedness or dizziness.  No unilateral numbness or tinglings extremities.  No facial droop or slurred speech.  He fell again last night, injured his back and head. No LOC.  He complains of lower back pain, which is constant, sharp, moderate, nonradiating.  No leg numbness or weakness. Patient does not have chest pain, cough, shortness breath.  No fever or chills.  No nausea, vomiting, diarrhea or abdominal pain.  Patient has urostomy, no hematuria.  Data reviewed independently and ED Course: pt was found to have WBC 12.6, positive urinalysis (cloudy appearance, moderate amount of leukocyte, many bacteria, WBC 25-50, squamous cells 6-10), renal function close to baseline, temperature normal, blood pressure 175/68, heart rate 92, 75, RR 16, oxygen saturation 99% on room air.  Negative CT head and CT of C-spine for acute injury.  X-ray of T-spine negative for acute injury.  X-ray of L-spine showed L2 compression fracture with minimal height loss of vertebra. MRI of brain is negative for stroke. Pt is place in MedSurg bed for obs.   X-ray of L spin 1. L2 superior endplate compression fracture, new since 2019 and suspected to be acute. With minimal verall L2 vertebral body loss of height. No retropulsion is evident. 2. Difficult to exclude L4 inferior endplate compression fracture also, although could  be artifact. 3.  Aortic Atherosclerosis (ICD10-I70.0).  EKG: I have personally reviewed.  Sinus rhythm, QTc 440, LAD, poor R wave progression, anteroseptal infarction pattern   Review of Systems:   General: no fevers, chills, no body weight gain, has fatigue HEENT: no blurry vision, hearing changes or sore throat Respiratory: no dyspnea, coughing, wheezing CV: no chest pain, no palpitations GI: no nausea, vomiting, abdominal pain, diarrhea, constipation GU: no dysuria, burning on urination, increased urinary frequency, hematuria  Ext: no leg edema Neuro: no unilateral weakness, numbness, or tingling, no vision change or hearing loss. Has fall Skin: no rash, no skin tear. MSK: No muscle spasm, no deformity, no limitation of range of movement in spin. Has lower back pain Heme: No easy bruising.  Travel history: No recent long distant travel.   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 chin. Nodular and infiltrative patterns.EDC.   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 Nodular. EDC   Basal cell carcinoma 08/26/2020   Right mid to inf. ear helix. Nodular, ulcerated. 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 Ulcerated.   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   L upper eyelid, exc and ED, pt had BCC of L upper eyelid and excision also included this SCC    Past Surgical History:  Procedure Laterality Date   BACK SURGERY     CATARACT EXTRACTION     HERNIA REPAIR     PORTA CATH INSERTION N/A 01/01/2019    Procedure: PORTA CATH INSERTION;  Surgeon: Algernon Huxley, MD;  Location: [facility];  Service: Cardiovascular;  Laterality: N/A;   TONSILLECTOMY     TRANSURETHRAL RESECTION OF BLADDER TUMOR N/A 12/08/2018   Procedure: TRANSURETHRAL RESECTION OF BLADDER TUMOR (TURBT);  Surgeon: Billey Co, MD;  Location: [facility];  Service: Urology;  Laterality: N/A;    Social History:  reports that he quit smoking about 29 years ago. His smoking use included cigarettes. He has never used smokeless tobacco. He reports current alcohol use. He reports that he does not use drugs.  Family History:  Family History  Problem Relation Age of Onset   Heart failure Mother      Prior to Admission medications   Medication Sig Start Date End Date Taking? Authorizing Provider  atorvastatin (LIPITOR) 20 MG tablet Take 20 mg by mouth daily.  05/02/18   [provider]  celecoxib (CELEBREX) 100 MG capsule Take 100 mg by mouth 2 (two) times daily.  Patient not taking: Reported on 02/03/2022    [provider]  enoxaparin (LOVENOX) 40 MG/0.4ML injection Inject 40 mg into the skin daily.  Patient not taking: Reported on 02/03/2022    [provider]  lisinopril-hydrochlorothiazide (ZESTORETIC) 20-12.5 MG tablet Take 1 tablet by mouth daily.  03/16/19   [provider]  metoprolol succinate (TOPROL-XL) 25 MG 24 hr tablet Take 25 mg by mouth daily.  Patient not taking: Reported on 02/03/2022 05/21/19   [provider]  Multiple Vitamins-Minerals (CENTRUM WOMEN) TABS Take 1 tablet by mouth daily.    [provider]  mupirocin ointment (BACTROBAN) 2 % Apply 1 Application topically daily. Qd to excision site on left upper eyelid 05/18/22   Ralene Bathe, MD  ondansetron (ZOFRAN) 8 MG tablet Take 1 tablet (8 mg total) by mouth 2 (two) times daily as needed. Start on the third day after cisplatin chemotherapy. 12/26/18   Lloyd Huger, MD  prochlorperazine (COMPAZINE)  10 MG tablet Take 1 tablet (10 mg total) by mouth every 6 (six) hours as needed (Nausea or vomiting). 12/26/18   Lloyd Huger, MD  Vitamin D, Ergocalciferol, (DRISDOL) 1.25 MG (50000 UT) CAPS capsule Take 50,000 Units by mouth every 7 (seven) days.     [provider]    Physical Exam: Vitals:   11/23/22 2231 11/24/22 0325 11/24/22 0725 11/24/22 1557  BP:  (!) 148/71 (!) 175/68 133/78  Pulse:  89 75 82  Resp:  '16 16 18  '$ Temp:  98.2 F (36.8 C) 98.2 F (36.8 C) 97.8 F (36.6 C)  TempSrc:  Oral Oral Oral  SpO2:  99% 100% 100%  Weight: 68 kg     Height: '5\' 8"'$  (1.727 m)      General: Not in acute distress HEENT:       Eyes: PERRL, EOMI, no scleral icterus.       ENT: No discharge from the ears and nose, no pharynx injection, no tonsillar enlargement.        Neck: No JVD, no bruit, no mass felt. Heme: No neck lymph node enlargement. Cardiac:  S1/S2, RRR, No murmurs, No gallops or rubs. Respiratory: No rales, wheezing, rhonchi or rubs. GI: Soft, nondistended, nontender, no rebound pain, no organomegaly, BS present. GU: No hematuria.  S/p for urostomy Ext: No pitting leg edema bilaterally. 1+DP/PT pulse bilaterally. Musculoskeletal: No joint deformities, No joint redness or warmth, no limitation of ROM in spin. Has tenderness in the lower back midline Skin: No rashes.  Neuro: Alert, oriented X3, cranial nerves II-XII grossly intact, moves all extremities normally. Psych: Patient is not psychotic, no suicidal or hemocidal ideation.  Labs on Admission: I have personally reviewed following labs and imaging studies  CBC: Recent Labs  Lab 11/23/22 2249  WBC 12.6*  HGB 13.7  HCT 43.0  MCV 95.6  PLT 151*   Basic Metabolic Panel: Recent Labs  Lab 11/23/22 2249  NA 138  K 4.4  CL 105  CO2 22  GLUCOSE 90  BUN 25*  CREATININE 1.21  CALCIUM 9.8   GFR: Estimated Creatinine Clearance: 44.5 mL/min (by C-G formula based on SCr of 1.21 mg/dL). Liver Function  Tests: No results for input(s): "AST", "ALT", "ALKPHOS", "BILITOT", "PROT", "ALBUMIN" in the last 168 hours. No results for input(s): "LIPASE", "AMYLASE" in the last 168 hours. No results for input(s): "AMMONIA" in the last 168 hours. Coagulation Profile: No results for input(s): "INR", "PROTIME" in the last 168 hours. Cardiac Enzymes: No results for input(s): "CKTOTAL", "CKMB", "CKMBINDEX", "TROPONINI" in the last 168 hours. BNP (last 3 results) No results for input(s): "PROBNP" in the last 8760 hours. HbA1C: No results for input(s): "HGBA1C" in the last 72 hours. CBG: No results for input(s): "GLUCAP" in the last 168 hours. Lipid Profile: No results for input(s): "CHOL", "HDL", "LDLCALC", "TRIG", "CHOLHDL", "LDLDIRECT" in the last 72 hours. Thyroid Function Tests: No results for input(s): "TSH", "T4TOTAL", "FREET4", "T3FREE", "THYROIDAB" in the last 72 hours. Anemia Panel: No results for input(s): "VITAMINB12", "FOLATE", "FERRITIN", "TIBC", "IRON", "RETICCTPCT" in the last 72 hours. Urine analysis:    Component Value Date/Time   COLORURINE AMBER (A) 11/23/2022 2249   APPEARANCEUR CLOUDY (A) 11/23/2022 2249   APPEARANCEUR Cloudy (A) 12/04/2018 1134   LABSPEC 1.017 11/23/2022 2249   PHURINE 8.0 11/23/2022 2249   GLUCOSEU NEGATIVE 11/23/2022 2249   HGBUR NEGATIVE 11/23/2022 2249   BILIRUBINUR NEGATIVE 11/23/2022 2249   BILIRUBINUR Negative 12/04/2018 1134   KETONESUR NEGATIVE 11/23/2022 2249   PROTEINUR >=300 (A) 11/23/2022 2249   NITRITE NEGATIVE 11/23/2022 2249   LEUKOCYTESUR MODERATE (A) 11/23/2022 2249   Sepsis Labs: '@LABRCNTIP'$ (procalcitonin:4,lacticidven:4) ) Recent Results (from the past 240 hour(s))  Resp panel by RT-PCR (RSV, Flu A&B, Covid) Anterior Nasal Swab     Status: None   Collection Time: 11/23/22 10:49 PM   Specimen: Anterior Nasal Swab  Result Value Ref Range Status   SARS Coronavirus 2 by RT PCR NEGATIVE NEGATIVE Final    Comment: (NOTE) SARS-CoV-2  target nucleic acids are NOT DETECTED.  The SARS-CoV-2 RNA is generally detectable in upper respiratory specimens during the acute phase of infection. The lowest concentration of SARS-CoV-2 viral copies this assay can detect is 138 copies/mL. A negative result does not preclude SARS-Cov-2 infection and should not be used as the sole basis for treatment or other patient management decisions. A negative result may occur with  improper specimen collection/handling, submission of specimen other than nasopharyngeal swab, presence of viral mutation(s) within the areas targeted by this assay, and inadequate number of viral copies(<138 copies/mL). A negative result must be combined with clinical observations, patient history,  and epidemiological information. The expected result is Negative.  Fact Sheet for Patients:  EntrepreneurPulse.com.au  Fact Sheet for Healthcare Providers:  IncredibleEmployment.be  This test is no t yet approved or cleared by the [place] FDA and  has been authorized for detection and/or diagnosis of SARS-CoV-2 by FDA under an Emergency Use Authorization (EUA). This EUA will remain  in effect (meaning this test can be used) for the duration of the COVID-19 declaration under Section 564(b)(1) of the Act, 21 U.S.C.section 360bbb-3(b)(1), unless the authorization is terminated  or revoked sooner.       Influenza A by PCR NEGATIVE NEGATIVE Final   Influenza B by PCR NEGATIVE NEGATIVE Final    Comment: (NOTE) The Xpert Xpress SARS-CoV-2/FLU/RSV plus assay is intended as an aid in the diagnosis of influenza from Nasopharyngeal swab specimens and should not be used as a sole basis for treatment. Nasal washings and aspirates are unacceptable for Xpert Xpress SARS-CoV-2/FLU/RSV testing.  Fact Sheet for Patients: EntrepreneurPulse.com.au  Fact Sheet for Healthcare  Providers: IncredibleEmployment.be  This test is not yet approved or cleared by the [place] FDA and has been authorized for detection and/or diagnosis of SARS-CoV-2 by FDA under an Emergency Use Authorization (EUA). This EUA will remain in effect (meaning this test can be used) for the duration of the COVID-19 declaration under Section 564(b)(1) of the Act, 21 U.S.C. section 360bbb-3(b)(1), unless the authorization is terminated or revoked.     Resp Syncytial Virus by PCR NEGATIVE NEGATIVE Final    Comment: (NOTE) Fact Sheet for Patients: EntrepreneurPulse.com.au  Fact Sheet for Healthcare Providers: IncredibleEmployment.be  This test is not yet approved or cleared by the [place] FDA and has been authorized for detection and/or diagnosis of SARS-CoV-2 by FDA under an Emergency Use Authorization (EUA). This EUA will remain in effect (meaning this test can be used) for the duration of the COVID-19 declaration under Section 564(b)(1) of the Act, 21 U.S.C. section 360bbb-3(b)(1), unless the authorization is terminated or revoked.  Performed at [facility], [address], [place], [place] [address]      Radiological Exams on Admission: MR BRAIN WO CONTRAST  Result Date: 11/24/2022 CLINICAL DATA:  Recurrent ataxia.  Bladder cancer. EXAM: MRI HEAD WITHOUT CONTRAST TECHNIQUE: Multiplanar, multiecho pulse sequences of the brain and surrounding structures were obtained without intravenous contrast. COMPARISON:  CT head 11/23/2022 FINDINGS: Brain: Moderate atrophy. Negative for acute infarct, hemorrhage, mass Chronic microvascular ischemic change in the white matter and pons. Chronic infarct left cerebellum. Vascular: Normal arterial flow voids Skull and upper cervical spine: No focal skeletal lesion. Sinuses/Orbits: Mucosal edema paranasal sinuses. Retention cyst right maxillary sinus. Bilateral cataract  extraction Other: None IMPRESSION: 1. Negative for acute infarct or mass. 2. Atrophy and moderate chronic microvascular ischemic change. Chronic infarct left cerebellum. Electronically Signed   By: Franchot Gallo M.D.   On: 11/24/2022 11:12   DG Lumbar Spine Complete  Result Date: 11/24/2022 CLINICAL DATA:  81 year old male with recurrent falls. EXAM: LUMBAR SPINE - COMPLETE 4+ VIEW COMPARISON:  Thoracic radiographs today. CT Abdomen and Pelvis 11/23/2018. FINDINGS: Normal lumbar segmentation, concordant with thoracic numbering today. Stable lumbar lordosis since 2019, including subtle anterolisthesis of L4 on L5. L2 superior endplate compression is new since 2019 and suspicious for acute fracture in this setting, with some suspected vertebral body fracture lucency visible. Minimal overall L2 vertebral body loss of height. No retropulsion is evident. L1 and L3 appear intact. L5 appears stable and intact. There is questionable  L4 inferior endplate deformity on the lateral views, not apparent on the AP or oblique images. No evidence of pars fracture. Multilevel chronic lumbar facet degeneration. Vance chronic disc and endplate degeneration at L5-S1. Extensive Calcified aortic atherosclerosis. Negative visible bowel gas pattern. Grossly intact visible sacrum and SI joints. IMPRESSION: 1. L2 superior endplate compression fracture, new since 2019 and suspected to be acute. If specific therapy is desired, Lumbar MRI without contrast or Nuclear Medicine Whole-body Bone Scan would confirm candidacy for vertebroplasty. 2. Difficult to exclude L4 inferior endplate compression fracture also, although could be artifact. 3.  Aortic Atherosclerosis (ICD10-I70.0). Electronically Signed   By: Genevie Ann M.D.   On: 11/24/2022 07:06   DG Thoracic Spine 2 View  Result Date: 11/24/2022 CLINICAL DATA:  81 year old male with recurrent falls. EXAM: THORACIC SPINE 2 VIEWS COMPARISON:  Portable chest 12/01/2018. Cervical spine CT  11/23/2022. 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 Stable cervicothoracic junction alignment. Normal thoracic segmentation. 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 Relatively preserved thoracic disc spaces. No thoracic compression fracture. Grossly intact posterior ribs. No acute osseous abnormality identified. Respiratory motion. Lower lung volumes. Mediastinal contours are stable. IMPRESSION: 1. No acute osseous abnormality identified in the thoracic spine. 2. 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 Electronically Signed   By: Genevie Ann M.D.   On: 11/24/2022 07:03   CT HEAD WO CONTRAST (5MM)  Result Date: 11/23/2022 CLINICAL DATA:  Trauma. EXAM: CT HEAD WITHOUT CONTRAST CT CERVICAL SPINE WITHOUT CONTRAST TECHNIQUE: Multidetector CT imaging of the head and cervical spine was performed following the standard protocol without intravenous contrast. Multiplanar CT image reconstructions of the cervical spine were also generated. RADIATION DOSE REDUCTION: This exam was performed according to the departmental dose-optimization program which includes automated exposure control, adjustment of the mA and/or kV according to patient size and/or use of iterative reconstruction technique. COMPARISON:  CT dated 05/26/2019. FINDINGS: CT HEAD FINDINGS Brain: Mild age-related atrophy and chronic microvascular ischemic changes. Area of old infarct involving the left cerebellar hemisphere. There is no acute intracranial hemorrhage. No mass effect or midline shift. No extra-axial fluid collection. Vascular: No hyperdense vessel or unexpected calcification. Skull: Normal. Negative for fracture or focal lesion. Sinuses/Orbits: Right maxillary sinus retention cyst  or polyp. The remainder of the visualized paranasal sinuses and mastoid air cells are clear. Other: None CT CERVICAL SPINE FINDINGS Alignment: No acute subluxation. Skull base and vertebrae: No acute fracture.  Osteopenia. Soft tissues and spinal canal: No prevertebral fluid or swelling. No visible canal hematoma. Disc levels: C4-C7 ACDF. Multilevel degenerative changes facet arthropathy. Upper chest: Negative. Other: Bilateral carotid bulb calcified plaques. IMPRESSION: 1. No acute intracranial pathology. Mild age-related atrophy and chronic microvascular ischemic changes. 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 2. No acute/traumatic cervical spine pathology. 3. C4-C7 ACDF. Electronically Signed   By: Anner Crete M.D.   On: 11/23/2022 23:52   CT Cervical Spine Wo Contrast  Result Date: 11/23/2022 CLINICAL DATA:  Trauma. EXAM: CT HEAD WITHOUT CONTRAST CT CERVICAL SPINE WITHOUT CONTRAST TECHNIQUE: Multidetector CT imaging of the head and cervical spine was performed following the standard protocol without intravenous contrast. Multiplanar CT image reconstructions of the cervical spine were also generated. RADIATION DOSE REDUCTION: This exam was performed according to the departmental dose-optimization program which includes automated exposure control, adjustment of the mA and/or  kV according to patient size and/or use of iterative reconstruction technique. COMPARISON:  CT dated 05/26/2019. FINDINGS: CT HEAD FINDINGS Brain: Mild age-related atrophy and chronic microvascular ischemic changes. Area of old infarct involving the left cerebellar hemisphere. There is no acute intracranial hemorrhage. No mass effect or midline shift. No extra-axial fluid collection. Vascular: No hyperdense vessel or unexpected calcification. Skull: Normal. Negative for fracture or focal lesion. Sinuses/Orbits: Right maxillary sinus retention cyst or polyp. The remainder of the visualized paranasal sinuses and mastoid air cells are  clear. Other: None CT CERVICAL SPINE FINDINGS Alignment: No acute subluxation. Skull base and vertebrae: No acute fracture.  Osteopenia. Soft tissues and spinal canal: No prevertebral fluid or swelling. No visible canal hematoma. Disc levels: C4-C7 ACDF. Multilevel degenerative changes facet arthropathy. Upper chest: Negative. Other: Bilateral carotid bulb calcified plaques. IMPRESSION: 1. No acute intracranial pathology. Mild age-related atrophy and chronic microvascular ischemic changes. 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 2. No acute/traumatic cervical spine pathology. 3. C4-C7 ACDF. Electronically Signed   By: Anner Crete M.D.   On: 11/23/2022 23:52      Assessment/Plan Principal Problem:   Urinary tract infection Active Problems:   Fall at home, initial encounter   Compression fracture of L2 ([place])   Essential hypertension   Hypercholesterolemia   Chronic kidney disease, stage 3a ([place])   Assessment and Plan:  Urinary tract infection:  -pt is admitted to tele bed as inpt -IV Rocephin, -Follow-up urine culture  Fall at home, initial encounter: Possibly due to UTI.  MRI for brain is negative for stroke -PT/OT -Fall precaution  Compression fracture of L2 [facility]): Has some minimal loss of vertebral height -Pain control: As needed Percocet, Tylenol, as needed Robaxin - lidoderm - place LSO   Essential hypertension: Blood pressure 175/68, 133/78.  Patient's not taking medications.  He used to take Zestoretic and metoprolol. -IV hydralazine as needed  Hypercholesterolemia -Lipitor  Chronic kidney disease, stage 3a ([place]): Renal function close to baseline.  Recent baseline creatinine 1.32.  His creatinine is 1.21, BUN 25, GFR 59 -Follow-up with BMI     DVT ppx: SQ Lovenox  Code Status: DNR per pt and his daughter  Family Communication:    Yes, patient's daughter at bed side.     Disposition Plan:  Anticipate discharge back to previous environment, independent living  facility  Consults called:  none  Admission status and Level of care: Med-Surg:  for obs    Dispo: The patient is from: Home              Anticipated d/c is to: Home              Anticipated d/c date is: 1 day              Patient currently is not medically stable to d/c.    Severity of Illness:  The appropriate patient status for this patient is OBSERVATION. Observation status is judged to be reasonable and necessary in order to provide the required intensity of service to ensure the patient's safety. The patient's presenting symptoms, physical exam findings, and initial radiographic and laboratory data in the context of their medical condition is felt to place them at decreased risk for further clinical deterioration. Furthermore, it is anticipated that the patient will be medically stable for discharge from the hospital within 2 midnights of admission.      Date of Service 11/24/2022    Ivor Costa [facility]   If 7PM-7AM, please contact night-coverage  [email or web address] 11/24/2022, 6:59 PM

## 2022-11-24 NOTE — Progress Notes (Signed)
Orthopedic Tech Progress Note Patient Details:  Philip [name] 02-Dec-1940 [id number] TLSO brace has been ordered from [facility]  Patient ID: Philip [name], male   DOB: 1941/05/29, 81 y.o.   MRN: [id number]  [name] 11/24/2022, 5:09 PM

## 2022-11-24 NOTE — Care Management CC44 (Signed)
Condition Code 44 Documentation Completed  Patient Details  Name: Philip [name] MRN: [id number] Date of Birth: 10-14-1941   Condition Code 44 given:  Yes Patient signature on Condition Code 44 notice:  Yes Documentation of 2 MD's agreement:  Yes Code 44 added to claim:  Yes    Beverly Sessions, RN 11/24/2022, 5:00 PM

## 2022-11-24 NOTE — ED Provider Notes (Signed)
[facility] Provider Note    Event Date/Time   First MD Initiated Contact with Patient 11/24/22 [phone]     (approximate)   History   Fall   HPI  Philip [name] is a 81 y.o. male with history of hypertension, bladder cancer status post urostomy, chronic kidney disease who presents to the emergency department with family for concerns for unsteady gait and frequent falls over the past 3 days.  Patient uses a cane at baseline but daughter states over the past 3 days he has had to use a walker.  He lives at [facility] independent living alone.  States he has hit his head but no loss of consciousness.  Complaining of lower thoracic and upper lumbar back pain.  No numbness or focal weakness.  Denies chest pain, shortness of breath, abdominal pain, vomiting, diarrhea, fever.  History provided by patient and family member.    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 chin. Nodular and infiltrative patterns.EDC.   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 Nodular. EDC   Basal cell carcinoma 08/26/2020   Right mid to inf. ear helix. Nodular, ulcerated. 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 Ulcerated.   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   L upper eyelid, exc and ED, pt had BCC of L upper eyelid and excision also included this SCC    Past Surgical History:  Procedure Laterality Date   BACK SURGERY     CATARACT EXTRACTION     HERNIA REPAIR     PORTA CATH INSERTION N/A 01/01/2019   Procedure: PORTA CATH INSERTION;   Surgeon: Algernon Huxley, MD;  Location: [facility];  Service: Cardiovascular;  Laterality: N/A;   TONSILLECTOMY     TRANSURETHRAL RESECTION OF BLADDER TUMOR N/A 12/08/2018   Procedure: TRANSURETHRAL RESECTION OF BLADDER TUMOR (TURBT);  Surgeon: Billey Co, MD;  Location: [facility];  Service: Urology;  Laterality: N/A;    MEDICATIONS:  Prior to Admission medications   Medication Sig Start Date End Date Taking? Authorizing Provider  atorvastatin (LIPITOR) 20 MG tablet Take 20 mg by mouth daily.  05/02/18   [provider]  celecoxib (CELEBREX) 100 MG capsule Take 100 mg by mouth 2 (two) times daily.  Patient not taking: Reported on 02/03/2022    [provider]  enoxaparin (LOVENOX) 40 MG/0.4ML injection Inject 40 mg into the skin daily.  Patient not taking: Reported on 02/03/2022    [provider]  lisinopril-hydrochlorothiazide (ZESTORETIC) 20-12.5 MG tablet Take 1 tablet by mouth daily.  03/16/19   [provider]  metoprolol succinate (TOPROL-XL) 25 MG 24 hr tablet Take 25 mg by mouth daily.  Patient not taking: Reported on 02/03/2022 05/21/19   [provider]  Multiple Vitamins-Minerals (CENTRUM WOMEN) TABS Take 1 tablet by mouth daily.    [provider]  mupirocin ointment (BACTROBAN) 2 % Apply 1 Application topically daily. Qd to excision site on left upper eyelid 05/18/22   Nehemiah Massed,  Monia Sabal, MD  ondansetron (ZOFRAN) 8 MG tablet Take 1 tablet (8 mg total) by mouth 2 (two) times daily as needed. Start on the third day after cisplatin chemotherapy. 12/26/18   Lloyd Huger, MD  prochlorperazine (COMPAZINE) 10 MG tablet Take 1 tablet (10 mg total) by mouth every 6 (six) hours as needed (Nausea or vomiting). 12/26/18   Lloyd Huger, MD  Vitamin D, Ergocalciferol, (DRISDOL) 1.25 MG (50000 UT) CAPS capsule Take 50,000 Units by mouth every 7 (seven) days.     [provider]    Physical Exam   Triage Vital  Signs: ED Triage Vitals  Enc Vitals Group     BP 11/23/22 2229 (!) 140/76     Pulse Rate 11/23/22 2229 92     Resp 11/23/22 2229 18     Temp 11/23/22 2229 98.4 F (36.9 C)     Temp Source 11/23/22 2229 Oral     SpO2 11/23/22 2229 99 %     Weight 11/23/22 2231 150 lb (68 kg)     Height 11/23/22 2231 '5\' 8"'$  (1.727 m)     Head Circumference --      Peak Flow --      Pain Score 11/23/22 2230 3     Pain Loc --      Pain Edu? --      Excl. in [address]? --     Most recent vital signs: Vitals:   11/23/22 2229 11/24/22 0325  BP: (!) 140/76 (!) 148/71  Pulse: 92 89  Resp: 18 16  Temp: 98.4 F (36.9 C) 98.2 F (36.8 C)  SpO2: 99% 99%     CONSTITUTIONAL: Alert and oriented and responds appropriately to questions.  Elderly, hard of hearing HEAD: Normocephalic; atraumatic EYES: Conjunctivae clear, PERRL, EOMI ENT: normal nose; no rhinorrhea; moist mucous membranes; pharynx without lesions noted; no dental injury; no septal hematoma, no epistaxis; no facial deformity or bony tenderness NECK: Supple, no midline spinal tenderness, step-off or deformity; trachea midline CARD: RRR; S1 and S2 appreciated; no murmurs, no clicks, no rubs, no gallops RESP: Normal chest excursion without splinting or tachypnea; breath sounds clear and equal bilaterally; no wheezes, no rhonchi, no rales; no hypoxia or respiratory distress CHEST:  chest wall stable, no crepitus or ecchymosis or deformity, nontender to palpation; no flail chest ABD/GI: Normal bowel sounds; non-distended; soft, non-tender, no rebound, no guarding; no ecchymosis or other lesions noted PELVIS:  stable, nontender to palpation BACK:  The back appears normal; tender over the lower thoracic and upper lumbar spine without step-off or deformity EXT: Normal ROM in all joints; non-tender to palpation; no edema; normal capillary refill; no cyanosis, no bony tenderness or bony deformity of patient's extremities, no joint effusion, compartments are  soft, extremities are warm and well-perfused, no ecchymosis SKIN: Normal color for age and race; warm NEURO: No facial asymmetry, normal speech, moving all extremities equally, extremely unsteady on his feet when transferring from wheelchair to bed  ED Results / Procedures / Treatments   LABS: (all labs ordered are listed, but only abnormal results are displayed) Labs Reviewed  BASIC METABOLIC PANEL - Abnormal; Notable for the following components:      Result Value   BUN 25 (*)    GFR, Estimated 59 (*)    All other components within normal limits  CBC - Abnormal; Notable for the following components:   WBC 12.6 (*)    Platelets 482 (*)    All other components within  normal limits  URINALYSIS, ROUTINE W REFLEX MICROSCOPIC - Abnormal; Notable for the following components:   Color, Urine AMBER (*)    APPearance CLOUDY (*)    Protein, ur >=300 (*)    Leukocytes,Ua MODERATE (*)    Bacteria, UA MANY (*)    All other components within normal limits  RESP PANEL BY RT-PCR (RSV, FLU A&B, COVID)  [id number]  URINE CULTURE  TROPONIN I (HIGH SENSITIVITY)     EKG:  EKG Interpretation  Date/Time:  Tuesday November 23 2022 22:43:54 EST Ventricular Rate:  92 PR Interval:  264 QRS Duration: 76 QT Interval:  356 QTC Calculation: 440 R Axis:   -61 Text Interpretation: Sinus rhythm with 1st degree A-V block Left axis deviation Anteroseptal infarct , age undetermined Abnormal ECG When compared with ECG of 26-May-2019 10:08, PREVIOUS ECG IS PRESENT Confirmed by Pryor Curia [phone]) on 11/24/2022 6:10:18 AM          RADIOLOGY: My personal review and interpretation of imaging: CT head and cervical spine unremarkable.  I have personally reviewed all radiology reports. DG Thoracic Spine 2 View  Result Date: 11/24/2022 CLINICAL DATA:  81 year old male with recurrent falls. EXAM: THORACIC SPINE 2 VIEWS COMPARISON:  Portable chest 12/01/2018. Cervical spine CT 11/23/2022. FINDINGS: Lower  cervical ACDF through C7, with probable interbody ankylosis extending through the cervicothoracic junction on the recent CT. Stable cervicothoracic junction alignment. Normal thoracic segmentation. 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 Relatively preserved thoracic disc spaces. No thoracic compression fracture. Grossly intact posterior ribs. No acute osseous abnormality identified. Respiratory motion. Lower lung volumes. Mediastinal contours are stable. IMPRESSION: 1. No acute osseous abnormality identified in the thoracic spine. 2. 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 Electronically Signed   By: Genevie Ann M.D.   On: 11/24/2022 07:03   CT HEAD WO CONTRAST (5MM)  Result Date: 11/23/2022 CLINICAL DATA:  Trauma. EXAM: CT HEAD WITHOUT CONTRAST CT CERVICAL SPINE WITHOUT CONTRAST TECHNIQUE: Multidetector CT imaging of the head and cervical spine was performed following the standard protocol without intravenous contrast. Multiplanar CT image reconstructions of the cervical spine were also generated. RADIATION DOSE REDUCTION: This exam was performed according to the departmental dose-optimization program which includes automated exposure control, adjustment of the mA and/or kV according to patient size and/or use of iterative reconstruction technique. COMPARISON:  CT dated 05/26/2019. FINDINGS: CT HEAD FINDINGS Brain: Mild age-related atrophy and chronic microvascular ischemic changes. Area of old infarct involving the left cerebellar hemisphere. There is no acute intracranial hemorrhage. No mass effect or midline shift. No extra-axial fluid collection. Vascular: No hyperdense vessel or unexpected calcification. Skull: Normal. Negative for fracture or focal lesion. Sinuses/Orbits: Right maxillary sinus retention cyst or polyp. The remainder of  the visualized paranasal sinuses and mastoid air cells are clear. Other: None CT CERVICAL SPINE FINDINGS Alignment: No acute subluxation. Skull base and vertebrae: No acute fracture.  Osteopenia. Soft tissues and spinal canal: No prevertebral fluid or swelling. No visible canal hematoma. Disc levels: C4-C7 ACDF. Multilevel degenerative changes facet arthropathy. Upper chest: Negative. Other: Bilateral carotid bulb calcified plaques. IMPRESSION: 1. No acute intracranial pathology. Mild age-related atrophy and chronic microvascular ischemic changes. 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 2. No acute/traumatic cervical spine pathology. 3. C4-C7 ACDF. Electronically Signed   By: Anner Crete M.D.   On: 11/23/2022 23:52   CT Cervical Spine Wo Contrast  Result Date: 11/23/2022 CLINICAL DATA:  Trauma. EXAM: CT HEAD WITHOUT CONTRAST CT CERVICAL SPINE WITHOUT CONTRAST TECHNIQUE: Multidetector CT imaging of the head and cervical spine was performed following the standard protocol without intravenous contrast. Multiplanar CT image reconstructions of the cervical spine were also generated. RADIATION DOSE REDUCTION: This exam was performed according to the departmental dose-optimization program which includes automated exposure control, adjustment of the mA and/or kV according to patient size and/or use of iterative reconstruction technique. COMPARISON:  CT dated 05/26/2019. FINDINGS: CT HEAD FINDINGS Brain: Mild age-related atrophy and chronic microvascular ischemic changes. Area of old infarct involving the left cerebellar hemisphere. There is no acute intracranial hemorrhage. No mass effect or midline shift. No extra-axial fluid collection. Vascular: No hyperdense vessel or unexpected calcification. Skull: Normal. Negative for fracture or focal lesion. Sinuses/Orbits: Right maxillary sinus retention cyst or polyp. The remainder of the visualized paranasal sinuses and mastoid air cells are clear. Other: None CT  CERVICAL SPINE FINDINGS Alignment: No acute subluxation. Skull base and vertebrae: No acute fracture.  Osteopenia. Soft tissues and spinal canal: No prevertebral fluid or swelling. No visible canal hematoma. Disc levels: C4-C7 ACDF. Multilevel degenerative changes facet arthropathy. Upper chest: Negative. Other: Bilateral carotid bulb calcified plaques. IMPRESSION: 1. No acute intracranial pathology. Mild age-related atrophy and chronic microvascular ischemic changes. 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 2. No acute/traumatic cervical spine pathology. 3. C4-C7 ACDF. Electronically Signed   By: Anner Crete M.D.   On: 11/23/2022 23:52     PROCEDURES:  Critical Care performed: No    Procedures    IMPRESSION / MDM / ASSESSMENT AND PLAN / ED COURSE  I reviewed the triage vital signs and the nursing notes.  Patient here with unsteady gait, frequent falls.     DIFFERENTIAL DIAGNOSIS (includes but not limited to):   Anemia, electrolyte derangement, dehydration, UTI, intracranial hemorrhage, skull fracture, spinal fracture  Patient's presentation is most consistent with acute presentation with potential threat to life or bodily function.  PLAN: Labs show leukocytosis of 12,000.  Normal electrolytes.  Renal function with creatinine of 1.2.  Urine shows moderate leukocyte esterase, red blood cells, white blood cells and many bacteria.  This could be contaminated from his urostomy but also could be an acute UTI.  Will add on a urine culture and cover with Rocephin.  This could be the cause of his weakness and frequent falls.  COVID, flu and RSV negative.  Troponin negative.  CT head and cervical spine reviewed and interpreted by myself and the radiologist and show no acute traumatic injury.  Will also obtain x-rays of thoracic and lumbar spine.  He has no focal neurologic deficits here.  No saddle anesthesia and moves his extremities normally but is very unsteady on his feet.  I feel he will  need admission given this is an acute change in his physical ability and he would not be able to care for himself safely at home.  Family at bedside agree.  Will discuss with hospitalist for admission for treatment for UTI, physical therapy, possible rehab or higher level of care placement.   MEDICATIONS GIVEN IN ED: Medications  cefTRIAXone (ROCEPHIN) 1 g in sodium chloride 0.9 % 100 mL IVPB (1 g Intravenous New Bag/Given 11/24/22 0642)  0.9 %  sodium chloride infusion ( Intravenous New Bag/Given 11/24/22 2376)     ED COURSE:  Xrays of the thoracic spine reviewed and interpreted by myself and the radiologist and show no acute osseous abnormality.  L spine pending.   CONSULTS:  Consulted  and discussed patient's case with hospitalist, Dr. Damita Dunnings.  I have recommended admission and consulting physician agrees and will place admission orders.  Patient (and family if present) agree with this plan.   I reviewed all nursing notes, vitals, pertinent previous records.  All labs, EKGs, imaging ordered have been independently reviewed and interpreted by myself.     OUTSIDE RECORDS REVIEWED: Reviewed last PCP note on 08/31/2022 with Dr. Ouida Sills.       FINAL CLINICAL IMPRESSION(S) / ED DIAGNOSES   Final diagnoses:  Generalized weakness  Frequent falls  Acute UTI     Rx / DC Orders   ED Discharge Orders     None        Note:  This document was prepared using Dragon voice recognition software and may include unintentional dictation errors.   Ajahni Nay, Delice Bison, DO 11/24/22 [phone]

## 2022-11-25 DIAGNOSIS — W19XXXA Unspecified fall, initial encounter: Secondary | ICD-10-CM | POA: Diagnosis not present

## 2022-11-25 DIAGNOSIS — N3000 Acute cystitis without hematuria: Secondary | ICD-10-CM | POA: Diagnosis not present

## 2022-11-25 DIAGNOSIS — Y92009 Unspecified place in unspecified non-institutional (private) residence as the place of occurrence of the external cause: Secondary | ICD-10-CM | POA: Diagnosis not present

## 2022-11-25 DIAGNOSIS — S32020A Wedge compression fracture of second lumbar vertebra, initial encounter for closed fracture: Secondary | ICD-10-CM | POA: Diagnosis not present

## 2022-11-25 DIAGNOSIS — N390 Urinary tract infection, site not specified: Secondary | ICD-10-CM | POA: Diagnosis not present

## 2022-11-25 LAB — BASIC METABOLIC PANEL
Anion gap: 7 (ref 5–15)
BUN: 21 mg/dL (ref 8–23)
CO2: 24 mmol/L (ref 22–32)
Calcium: 8.5 mg/dL — ABNORMAL LOW (ref 8.9–10.3)
Chloride: 109 mmol/L (ref 98–111)
Creatinine, Ser: 1.04 mg/dL (ref 0.61–1.24)
GFR, Estimated: >60 mL/min (ref >60)
Glucose, Bld: 112 mg/dL — ABNORMAL HIGH (ref 70–99)
Potassium: 3.9 mmol/L (ref 3.5–5.1)
Sodium: 140 mmol/L (ref 135–145)

## 2022-11-25 LAB — CBC
HCT: 35.5 % — ABNORMAL LOW (ref 39.0–52.0)
Hemoglobin: 11.7 g/dL — ABNORMAL LOW (ref 13.0–17.0)
MCH: 31.1 pg (ref 26.0–34.0)
MCHC: 33 g/dL (ref 30.0–36.0)
MCV: 94.4 fL (ref 80.0–100.0)
Platelets: 433 10*3/uL — ABNORMAL HIGH (ref 150–400)
RBC: 3.76 MIL/uL — ABNORMAL LOW (ref 4.22–5.81)
RDW: 13.5 % (ref 11.5–15.5)
WBC: 13.9 10*3/uL — ABNORMAL HIGH (ref 4.0–10.5)
nRBC: 0 % (ref 0.0–0.2)

## 2022-11-25 LAB — URINE CULTURE

## 2022-11-25 MED ORDER — CEPHALEXIN 500 MG PO CAPS
500.0000 mg | ORAL_CAPSULE | Freq: Three times a day (TID) | ORAL | Status: DC
  Administered 2022-11-25 – 2022-11-26 (×4): 500 mg via ORAL
  Filled 2022-11-25 (×4): qty 1

## 2022-11-25 NOTE — Hospital Course (Signed)
Philip [name] is a 81 y.o. male with medical history significant of hypertension, hyperlipidemia, CKD-3A, skin cancer, bladder cancer, s/p of her urostomy, who presents with fall.  He had a mild leukocytosis with white cell 12.6, UA mildly abnormal, urine culture had multiple species.  X-ray showed L2 compression fracture with minimal height loss of vertebra. Patient is placed on Rocephin for possible UTI.  Continue fluids.  Patient has been evaluated by PT/OT, recommended nursing home placement.

## 2022-11-25 NOTE — Plan of Care (Signed)
  Problem: Education: Goal: Knowledge of General Education information will improve Description Including pain rating scale, medication(s)/side effects and non-pharmacologic comfort measures Outcome: Progressing   

## 2022-11-25 NOTE — TOC Initial Note (Signed)
Transition of Care [facility]) - Initial/Assessment Note    Patient Details  Name: Philip [name] MRN: [id number] Date of Birth: 1941-02-26  [facility]) CM/SW Contact:    Beverly Sessions, RN Phone Number: 11/25/2022, 2:49 PM  Clinical Narrative:                  Patient admitted from [facility] recommending SNF Discussed with patient and daughter they are both in agreement to SNF side of [place] at discharge  Due to patient being observation status he does not qualify for his medicare to cover SNF.  Per Seth Bake private pay would be $358 per day for room and board.   Patient in agreement.   Awaiting delivery of back brace to room Anticipate dc tomorrow with EMS transport.  Crystal with [facility] updated as Seth Bake is off today.  Fl2 sent for signature          Patient Goals and CMS Choice            Expected Discharge Plan and Services                                              Prior Living Arrangements/Services                       Activities of Daily Living      Permission Sought/Granted                  Emotional Assessment              Admission diagnosis:  UTI (urinary tract infection) [N39.0] Urinary tract infection [N39.0] Acute UTI [N39.0] Generalized weakness [R53.1] Frequent falls [R29.6] Patient Active Problem List   Diagnosis Date Noted   Urinary tract infection 11/24/2022   Fall at home, initial encounter 11/24/2022   Compression fracture of L2 ([place]) 11/24/2022   Chronic kidney disease, 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Phone: [phone]: [phone]    Social Determinants of Health (SDOH) Social History: SDOH Screenings   Tobacco Use: Medium Risk (11/23/2022)   SDOH Interventions:     Readmission Risk Interventions     No data to display

## 2022-11-25 NOTE — Progress Notes (Signed)
Physical Therapy Treatment Patient Details Name: Philip [name] MRN: [id number] DOB: [birth year] Today's Date: 11/25/2022   History of Present Illness Pt is an 81 y/o M admitted on 11/23/22 after presenting with c/o unsteady gait & frequent falls over the past 3 days. Imaging shows L2 superior endplate compression fx, suspected to be acute. PMH: HTN, bladder CA s/p urostomy, CKD    PT Comments    Session performed with daughter present. TLSO donned and attempted to be adjusted, however too small for patient. Donned for OOB mobility and education patient that this writer would ask for LSO brace to be delivered. Secure chat with care team notifying of patient needs. Pt able to perform OOB mobility, however fatigues quickly, unable to ambulate away from bed. Educated on sitting up for med pass, toileting, and meals. Will continue to progress as able.   Recommendations for follow up therapy are one component of a multi-disciplinary discharge planning process, led by the attending physician.  Recommendations may be updated based on patient status, additional functional criteria and insurance authorization.  Follow Up Recommendations  Skilled nursing-short term rehab (<3 hours/day) Can patient physically be transported by private vehicle: No   Assistance Recommended at Discharge Intermittent Supervision/Assistance  Patient can return home with the following A little help with walking and/or transfers;A little help with bathing/dressing/bathroom;Assistance with cooking/housework;Assist for transportation;Help with stairs or ramp for entrance   Equipment Recommendations  None recommended by PT    Recommendations for Other Services       Precautions / Restrictions Precautions Precautions: Fall;Back Precaution Booklet Issued: No Precaution Comments: order changed 12/27 from TLSO to LSO Required Braces or Orthoses: Spinal Brace Spinal Brace: Thoracolumbosacral orthotic;Applied in sitting  position Restrictions Weight Bearing Restrictions: No     Mobility  Bed Mobility Overal bed mobility: Needs Assistance Bed Mobility: Supine to Sit     Supine to sit: Mod assist     General bed mobility comments: needs cues for log roll technique. Mod assist for trunkal assistance. Once seated, able to scoot towards EOB. When returning to supine, needs assist for repositioning    Transfers Overall transfer level: Needs assistance Equipment used: Rolling walker (2 wheels) Transfers: Sit to/from Stand Sit to Stand: Mod assist           General transfer comment: requires bed to be elevated prior to standing. TLSO donned, however is too small and doesn't fit.    Ambulation/Gait Ambulation/Gait assistance: Min assist Gait Distance (Feet): 2 Feet Assistive device: Rolling walker (2 wheels) Gait Pattern/deviations: Step-to pattern       General Gait Details: ambulated 1 step in forward/post direction using RW. Very unsteady with forward flexed posture. Pt then able to take 1 step towards HOB. Not safe to further ambulate at this time   Stairs             Wheelchair Mobility    Modified Rankin (Stroke Patients Only)       Balance Overall balance assessment: Needs assistance Sitting-balance support: Feet supported, Bilateral upper extremity supported Sitting balance-Leahy Scale: Fair     Standing balance support: Bilateral upper extremity supported Standing balance-Leahy Scale: Poor                              Cognition Arousal/Alertness: Awake/alert Behavior During Therapy: WFL for tasks assessed/performed Overall Cognitive Status: Within Functional Limits for tasks assessed  General Comments: pleasant and agreeable to session        Exercises Other Exercises Other Exercises: education provided regarding brace, mobility, and prevention of complications.    General Comments         Pertinent Vitals/Pain Pain Assessment Pain Assessment: No/denies pain    Home Living Family/patient expects to be discharged to:: Private residence Living Arrangements: Alone Available Help at Discharge: Available PRN/intermittently Type of Home: Independent living facility Home Access: Level entry       Home Layout: One level Home Equipment: Rollator (4 wheels);Cane - quad      Prior Function            PT Goals (current goals can now be found in the care plan section) Acute Rehab PT Goals Patient Stated Goal: get better PT Goal Formulation: With patient Time For Goal Achievement: 12/08/22 Potential to Achieve Goals: Good Progress towards PT goals: Progressing toward goals    Frequency    7X/week      PT Plan Current plan remains appropriate    Co-evaluation              AM-PAC PT "6 Clicks" Mobility   Outcome Measure  Help needed turning from your back to your side while in a flat bed without using bedrails?: A Little Help needed moving from lying on your back to sitting on the side of a flat bed without using bedrails?: A Lot Help needed moving to and from a bed to a chair (including a wheelchair)?: A Lot Help needed standing up from a chair using your arms (e.g., wheelchair or bedside chair)?: A Lot Help needed to walk in hospital room?: A Lot Help needed climbing 3-5 steps with a railing? : A Lot 6 Click Score: 13    End of Session   Activity Tolerance: Patient limited by pain Patient left: in bed;with bed alarm set Nurse Communication: Mobility status;Precautions PT Visit Diagnosis: Difficulty in walking, not elsewhere classified (R26.2);Muscle weakness (generalized) (M62.81)     Time: 1771-1657 PT Time Calculation (min) (ACUTE ONLY): 27 min  Charges:  $Gait Training: 23-37 mins                     Greggory Stallion, PT, DPT, GCS [phone]    Philip [name] 11/25/2022, 3:09 PM

## 2022-11-25 NOTE — Evaluation (Signed)
Occupational Therapy Evaluation Patient Details Name: Philip [name] MRN: [id number] DOB: 01/12/1941 Today's Date: 11/25/2022   History of Present Illness Pt is an 81 y/o M admitted on 11/23/22 after presenting with c/o unsteady gait & frequent falls over the past 3 days. Imaging shows L2 superior endplate compression fx, suspected to be acute. PMH: HTN, bladder CA s/p urostomy, CKD   Clinical Impression   Philip [name] presents with generalized weakness, limited endurance, impaired balance, and pain. He comes to [facility] from [facility], where he lives in the [place]. Until ~ 1-2 months ago, pt has been IND in B/IADL, driving, doing his own shopping and cooking, participating in activities at [facility], ambulating with a QC. In recent weeks, however, he has experienced several falls, feels much weaker, fatigues quickly. He states that it now takes him ~ 15 minutes to get up and out of bed. His daughter reports that she saw him at Thanksgiving and then again at Christmas and that she noted "a significant decline" during that time. During today's evaluation, pt requires Min-Mod A and greatly increased time for bed mobility, tires and asks to return to bed after ~ 3 minutes in sitting. TLSO brace delivered to pt's room does not fit pt and possibly caused pt's ostomy bag to become detached (RN replaces during OT session). Pt now waiting on LSO. Provided educ re: falls prevention, donning/doffing brace, ECS. Recommend DC to SNF within [facility].    Recommendations for follow up therapy are one component of a multi-disciplinary discharge planning process, led by the attending physician.  Recommendations may be updated based on patient status, additional functional criteria and insurance authorization.   Follow Up Recommendations  Skilled nursing-short term rehab (<3 hours/day)     Assistance Recommended at Discharge Frequent or constant Supervision/Assistance  Patient can return home with the  following A lot of help with bathing/dressing/bathroom;A little help with walking and/or transfers;Assist for transportation    Functional Status Assessment  Patient has had a recent decline in their functional status and demonstrates the ability to make significant improvements in function in a reasonable and predictable amount of time.  Equipment Recommendations  None recommended by OT    Recommendations for Other Services       Precautions / Restrictions Precautions Precautions: Fall;Back Precaution Comments: order changed 12/27 from TLSO to LSO Required Braces or Orthoses: Spinal Brace Restrictions Weight Bearing Restrictions: No      Mobility Bed Mobility Overal bed mobility: Needs Assistance Bed Mobility: Sidelying to Sit, Rolling Rolling: Mod assist Sidelying to sit: Min assist, HOB elevated       General bed mobility comments: greatly increased time and effort for bed mobility    Transfers                   General transfer comment: pt declines, citing fatigue.      Balance Overall balance assessment: Needs assistance Sitting-balance support: Feet supported, Bilateral upper extremity supported Sitting balance-Leahy Scale: Fair Sitting balance - Comments: tires quickly in sitting Postural control: Right lateral lean                                 ADL either performed or assessed with clinical judgement   ADL Overall ADL's : Needs assistance/impaired     Grooming: Minimal assistance           Upper Body Dressing : Moderate assistance  Lower Body Dressing: Moderate assistance   Toilet Transfer: Moderate assistance             General ADL Comments: Requires Mod A for OOB fxl mobility at present, 2/2 fatigue, lethargy, lower back pain     Vision         Perception     Praxis      Pertinent Vitals/Pain Pain Assessment Pain Score: 0-No pain     Hand Dominance     Extremity/Trunk Assessment Upper Extremity  Assessment Upper Extremity Assessment: Generalized weakness   Lower Extremity Assessment Lower Extremity Assessment: Generalized weakness   Cervical / Trunk Assessment Cervical / Trunk Assessment: Normal   Communication Communication Communication: No difficulties   Cognition Arousal/Alertness: Awake/alert Behavior During Therapy: WFL for tasks assessed/performed Overall Cognitive Status: Within Functional Limits for tasks assessed                                       General Comments       Exercises Other Exercises Other Exercises: Educ re: falls prevention, ECS, donning/doffing brace, DC recs   Shoulder Instructions      Home Living Family/patient expects to be discharged to:: Private residence Living Arrangements: Alone Available Help at Discharge: Available PRN/intermittently Type of Home: Independent living facility Home Access: Level entry     Home Layout: One level     Bathroom Shower/Tub: Occupational psychologist: Handicapped height     Home Equipment: Rollator (4 wheels);Cane - quad          Prior Functioning/Environment Prior Level of Function : Independent/Modified Independent;Driving             Mobility Comments: Pt is mod I with QD, driving at baseline but has been experiencing a decline in past few months, w/ increasing fatigue, back pain, and several falls (2-4?) in past month. ADLs Comments: Pt completes all ADL w/ Mod I, does own shopping, cooking, but reports this has become more difficult for him in past month or two. ILF provides assistance with housecleaning.        OT Problem List: Decreased strength;Decreased range of motion;Decreased activity tolerance;Impaired balance (sitting and/or standing);Pain;Decreased knowledge of use of DME or AE      OT Treatment/Interventions: Self-care/ADL training;Therapeutic exercise;Patient/family education;Balance training;Energy conservation;Therapeutic activities;DME  and/or AE instruction    OT Goals(Current goals can be found in the care plan section) Acute Rehab OT Goals Patient Stated Goal: to get back to his normal OT Goal Formulation: With patient Time For Goal Achievement: 12/09/22 Potential to Achieve Goals: Good ADL Goals Pt Will Perform Lower Body Dressing: with min assist;sitting/lateral leans Pt Will Perform Tub/Shower Transfer: with supervision;ambulating;shower seat;Stand pivot transfer (using LRAD) Additional ADL Goal #1: Pt will be able to transfer supine<sitting<standing in 7 minutes or less w/ Mod I  OT Frequency: Min 2X/week    Co-evaluation              AM-PAC OT "6 Clicks" Daily Activity     Outcome Measure Help from another person eating meals?: None Help from another person taking care of personal grooming?: A Little Help from another person toileting, which includes using toliet, bedpan, or urinal?: A Little Help from another person bathing (including washing, rinsing, drying)?: A Little Help from another person to put on and taking off regular upper body clothing?: A Little Help from another person to put on  and taking off regular lower body clothing?: A Lot 6 Click Score: 18   End of Session    Activity Tolerance: Patient limited by lethargy;Patient limited by fatigue Patient left: in bed;with family/visitor present;with bed alarm set;with call bell/phone within reach  OT Visit Diagnosis: Unsteadiness on feet (R26.81);Repeated falls (R29.6);Muscle weakness (generalized) (M62.81);Pain;History of falling (Z91.81)                Time: 8307-3543 OT Time Calculation (min): 25 min Charges:  OT General Charges $OT Visit: 1 Visit OT Evaluation $OT Eval Low Complexity: 1 Low OT Treatments $Self Care/Home Management : 23-37 mins Josiah Lobo, PhD, MS, OTR/L 11/25/22, 1:02 PM

## 2022-11-25 NOTE — Progress Notes (Signed)
  Progress Note   Patient: Philip [name] DOB: 08-Aug-1941 DOA: 11/24/2022     1 DOS: the patient was seen and examined on 11/25/2022   Brief hospital course: Philip [name] is a 81 y.o. male with medical history significant of hypertension, hyperlipidemia, CKD-3A, skin cancer, bladder cancer, s/p of her urostomy, who presents with fall.  He had a mild leukocytosis with white cell 12.6, UA mildly abnormal, urine culture had multiple species.  X-ray showed L2 compression fracture with minimal height loss of vertebra. Patient is placed on Rocephin for possible UTI.  Continue fluids.  Patient has been evaluated by PT/OT, recommended nursing home placement.  Assessment and Plan: Urinary tract infection:  Patient doing well today, urine culture as above.  Will change antibiotic to oral Keflex with total course of 3 days.  Fall at home, initial encounter:  PT/OT.  Nursing home placement for patient tomorrow.   Compression fracture of L2 ([place]):  Patient appears to be severely debilitated, LSO has been ordered, did not have the right size.  Discussed with OT and SW, nursing home once able to have appropriate LSO before accepting.   Essential hypertension: Resume home medicines.  Hypercholesterolemia -Lipitor   Chronic kidney disease, stage 3a  Renal function stable/better.     Subjective:  Patient has severe weakness, otherwise doing well.  Physical Exam: Vitals:   11/24/22 1557 11/24/22 1925 11/25/22 0454 11/25/22 0801  BP: 133/78 124/66 (!) 156/69 (!) 142/86  Pulse: 82 85 79 78  Resp: '18 20 16 18  '$ Temp: 97.8 F (36.6 C) 98 F (36.7 C) 97.9 F (36.6 C) 98.2 F (36.8 C)  TempSrc: Oral Oral  Oral  SpO2: 100% 100% 99% 98%  Weight:      Height:       General exam: Appears calm and comfortable  Respiratory system: Clear to auscultation. Respiratory effort normal. Cardiovascular system: S1 & S2 heard, RRR. No JVD, murmurs, rubs, gallops or clicks. No pedal  edema. Gastrointestinal system: Abdomen is nondistended, soft and nontender. No organomegaly or masses felt. Normal bowel sounds heard. Central nervous system: Alert and oriented. No focal neurological deficits. Extremities: Symmetric 5 x 5 power. Skin: No rashes, lesions or ulcers Psychiatry: Judgement and insight appear normal. Mood & affect appropriate.   Data Reviewed:  X-ray, MRI, lab results reviewed.  Family Communication:   Disposition: Status is: Observation   Planned Discharge Destination: Skilled nursing facility    Time spent: 35 minutes  Author: Sharen Hones, MD 11/25/2022 1:55 PM  For on call review [email or web address].

## 2022-11-25 NOTE — NC FL2 (Signed)
[place] LEVEL OF CARE FORM     IDENTIFICATION  Patient Name: Philip [name] Birthdate: [birth year] Sex: male Admission Date (Current Location): 11/24/2022  [facility] and [place] Number:  Engineering geologist and Address:  [facility], [address] [place], [place] [address]      Provider Number: [id number]  Attending Physician Name and Address:  Sharen Hones, MD  Relative Name and Phone Number:       Current Level of Care: Hospital Recommended Level of Care: [place] Prior Approval Number:    Date Approved/Denied:   PASRR Number:    Discharge Plan: Home    Current Diagnoses: Patient Active Problem List   Diagnosis Date Noted   Urinary tract infection 11/24/2022   Fall at home, initial encounter 11/24/2022   Compression fracture of L2 ([place]) 11/24/2022   Chronic kidney disease, stage 3a ([place]) 11/24/2022   UTI (urinary tract infection) 11/24/2022   Dehydration 05/26/2019   AKI (acute kidney injury) ([place]) 03/22/2019   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[place]) 12/08/2018   Hyperglycemia 11/01/2017   Essential hypertension 05/18/2017   Healthcare maintenance 05/18/2017   Hypercholesterolemia 05/18/2017   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    Orientation RESPIRATION BLADDER Height & Weight     Situation, Self, Time, Place  Normal Continent Weight: 68 kg Height:  '5\' 8"'$  (172.7 cm)  BEHAVIORAL SYMPTOMS/MOOD NEUROLOGICAL BOWEL NUTRITION STATUS      Incontinent Diet (Heart Healthy)  AMBULATORY STATUS COMMUNICATION OF NEEDS Skin   Limited Assist Verbally Normal                       Personal Care Assistance Level of Assistance              Functional Limitations Info             SPECIAL CARE FACTORS FREQUENCY  PT (By licensed PT), OT (By licensed OT) (Back Brace)                    Contractures Contractures Info: Not  present    Additional Factors Info  Code Status, Allergies Code Status Info: DNR Allergies Info: NKDA           Current Medications (11/25/2022):  This is the current hospital active medication list Current Facility-Administered Medications  Medication Dose Route Frequency Provider Last Rate Last Admin   acetaminophen (TYLENOL) tablet 650 mg  650 mg Oral Q6H PRN Ivor Costa, MD       atorvastatin (LIPITOR) tablet 20 mg  20 mg Oral Daily Ivor Costa, MD   20 mg at 11/25/22 1110   cefTRIAXone (ROCEPHIN) 1 g in sodium chloride 0.9 % 100 mL IVPB  1 g Intravenous Q24H Ivor Costa, MD 200 mL/hr at 11/25/22 0537 1 g at 11/25/22 0537   enoxaparin (LOVENOX) injection 40 mg  40 mg Subcutaneous Q24H Ivor Costa, MD   40 mg at 11/25/22 1111   hydrALAZINE (APRESOLINE) injection 5 mg  5 mg Intravenous Q2H PRN Ivor Costa, MD       lidocaine (LIDODERM) 5 % 1 patch  1 patch Transdermal Q24H Ivor Costa, MD   1 patch at 11/25/22 1110   methocarbamol (ROBAXIN) tablet 500 mg  500 mg Oral Q8H PRN Ivor Costa, MD       multivitamin with minerals tablet 1 tablet  1 tablet Oral Daily Ivor Costa, MD  1 tablet at 11/25/22 1110   ondansetron (ZOFRAN) injection 4 mg  4 mg Intravenous Q8H PRN Ivor Costa, MD       oxyCODONE-acetaminophen (PERCOCET/ROXICET) 5-325 MG per tablet 1 tablet  1 tablet Oral Q4H PRN Ivor Costa, MD       polyethylene glycol (MIRALAX / GLYCOLAX) packet 17 g  17 g Oral Daily Ivor Costa, MD   17 g at 11/24/22 1717   senna-docusate (Senokot-S) tablet 1 tablet  1 tablet Oral QHS PRN Ivor Costa, MD         Discharge Medications: Please see discharge summary for a list of discharge medications.  Relevant Imaging Results:  Relevant Lab Results:   Additional Information ss [id number]  Beverly Sessions, RN

## 2022-11-26 DIAGNOSIS — N1831 Chronic kidney disease, stage 3a: Secondary | ICD-10-CM | POA: Diagnosis not present

## 2022-11-26 DIAGNOSIS — S32020G Wedge compression fracture of second lumbar vertebra, subsequent encounter for fracture with delayed healing: Secondary | ICD-10-CM | POA: Diagnosis not present

## 2022-11-26 DIAGNOSIS — N390 Urinary tract infection, site not specified: Secondary | ICD-10-CM | POA: Diagnosis not present

## 2022-11-26 DIAGNOSIS — N3000 Acute cystitis without hematuria: Secondary | ICD-10-CM | POA: Diagnosis not present

## 2022-11-26 MED ORDER — LIDOCAINE 5 % EX PTCH: 1.0000 | MEDICATED_PATCH | CUTANEOUS | 0 refills | Status: DC

## 2022-11-26 MED ORDER — OXYCODONE-ACETAMINOPHEN 5-325 MG PO TABS: 1.0000 | ORAL_TABLET | Freq: Four times a day (QID) | ORAL | 0 refills | Status: DC | PRN

## 2022-11-26 NOTE — Plan of Care (Signed)

## 2022-11-26 NOTE — Discharge Summary (Addendum)
Physician Discharge Summary   Patient: Philip [name] MRN: [id number] DOB: Jun 08, 1941  Admit date:     11/24/2022  Discharge date: 11/26/22  Discharge Physician: Sharen Hones   PCP: Kirk Ruths, MD   Recommendations at discharge:   Follow-up with PCP in 1 week.  Discharge Diagnoses: Principal Problem:   Urinary tract infection Active Problems:   Fall at home, initial encounter   Compression fracture of L2 ([place])   Essential hypertension   Hypercholesterolemia   Chronic kidney disease, stage 3a (H[facility]) Reactive thrombocytosis. Resolved Problems:   * No resolved hospital problems. *  Hospital Course: Philip [name] is a 81 y.o. male with medical history significant of hypertension, hyperlipidemia, CKD-3A, skin cancer, bladder cancer, s/p of her urostomy, who presents with fall.  He had a mild leukocytosis with white cell 12.6, UA mildly abnormal, urine culture had multiple species.  X-ray showed L2 compression fracture with minimal height loss of vertebra. Patient is placed on Rocephin for possible UTI.  Continue fluids.  Patient has been evaluated by PT/OT, recommended nursing home placement.  Assessment and Plan: Urinary tract infection:  Patient doing well today, urine culture showed mixed flora.  Patient has urostomy.  Finished 3 days of antibiotics, no additional antibiotics needed.  Fall at home, initial encounter:  PT/OT.  Nursing home placement for patient tomorrow.   Compression fracture of L2 ([place]):  Patient appears to be severely debilitated, LSO has been ordered, did not have the right size.  LSO may interfere with urostomy, make it difficult to place.  Since patient compression fracture is stable, minimal compression.  OT is fit testing LSO, if we cannot find the right LSO, patient can discharge pending outpatient fitting.    Essential hypertension: Resume home medicines.   Hypercholesterolemia -Lipitor   Chronic kidney disease, stage 3a  Renal function  stable/better.       Consultants: None Procedures performed: None  Disposition: Skilled nursing facility Diet recommendation:  Discharge Diet Orders (From admission, onward)     Start     Ordered   11/26/22 0000  Diet - low sodium heart healthy        11/26/22 0936           Cardiac diet DISCHARGE MEDICATION: Allergies as of 11/26/2022   No Known Allergies      Medication List     STOP taking these medications    celecoxib 100 MG capsule Commonly known as: CELEBREX   enoxaparin 40 MG/0.4ML injection Commonly known as: LOVENOX   mupirocin ointment 2 % Commonly known as: BACTROBAN   ondansetron 8 MG tablet Commonly known as: Zofran   prochlorperazine 10 MG tablet Commonly known as: COMPAZINE       TAKE these medications    atorvastatin 20 MG tablet Commonly known as: LIPITOR Take 20 mg by mouth daily.   Centrum Women Tabs Take 1 tablet by mouth daily.   lidocaine 5 % Commonly known as: LIDODERM Place 1 patch onto the skin daily. Remove & Discard patch within 12 hours or as directed by MD Start taking on: November 27, 2022   lisinopril-hydrochlorothiazide 20-12.5 MG tablet Commonly known as: ZESTORETIC Take 1 tablet by mouth daily.   metoprolol succinate 25 MG 24 hr tablet Commonly known as: TOPROL-XL Take 25 mg by mouth daily.   oxyCODONE-acetaminophen 5-325 MG tablet Commonly known as: PERCOCET/ROXICET Take 1 tablet by mouth every 6 (six) hours as needed for moderate pain.   Vitamin D (Ergocalciferol) 1.25 MG (50000 UNIT)  Caps capsule Commonly known as: DRISDOL Take 50,000 Units by mouth every 7 (seven) days.        Contact information for follow-up providers     Kirk Ruths, MD Follow up in 1 week(s).   Specialty: Internal Medicine Contact information: [place] [facility] [address] [place] [address] [phone]              Contact information for after-discharge care     Destination      [facility] .   Service: Skilled Nursing Contact information: [place] [phone]                    Discharge Exam: Philip [name] Weights   11/23/22 2231  Weight: 68 kg   General exam: Appears calm and comfortable  Respiratory system: Clear to auscultation. Respiratory effort normal. Cardiovascular system: S1 & S2 heard, RRR. No JVD, murmurs, rubs, gallops or clicks. No pedal edema. Gastrointestinal system: Abdomen is nondistended, soft and nontender. No organomegaly or masses felt. Normal bowel sounds heard. Central nervous system: Alert and oriented. No focal neurological deficits. Extremities: Symmetric 5 x 5 power. Skin: No rashes, lesions or ulcers Psychiatry: Judgement and insight appear normal. Mood & affect appropriate.    Condition at discharge: good  The results of significant diagnostics from this hospitalization (including imaging, microbiology, ancillary and laboratory) are listed below for reference.   Imaging Studies: MR BRAIN WO CONTRAST  Result Date: 11/24/2022 CLINICAL DATA:  Recurrent ataxia.  Bladder cancer. EXAM: MRI HEAD WITHOUT CONTRAST TECHNIQUE: Multiplanar, multiecho pulse sequences of the brain and surrounding structures were obtained without intravenous contrast. COMPARISON:  CT head 11/23/2022 FINDINGS: Brain: Moderate atrophy. Negative for acute infarct, hemorrhage, mass Chronic microvascular ischemic change in the white matter and pons. Chronic infarct left cerebellum. Vascular: Normal arterial flow voids Skull and upper cervical spine: No focal skeletal lesion. Sinuses/Orbits: Mucosal edema paranasal sinuses. Retention cyst right maxillary sinus. Bilateral cataract extraction Other: None IMPRESSION: 1. Negative for acute infarct or mass. 2. Atrophy and moderate chronic microvascular ischemic change. Chronic infarct left cerebellum. Electronically Signed   By: Franchot Gallo M.D.   On:  11/24/2022 11:12   DG Lumbar Spine Complete  Result Date: 11/24/2022 CLINICAL DATA:  81 year old male with recurrent falls. EXAM: LUMBAR SPINE - COMPLETE 4+ VIEW COMPARISON:  Thoracic radiographs today. CT Abdomen and Pelvis 11/23/2018. FINDINGS: Normal lumbar segmentation, concordant with thoracic numbering today. Stable lumbar lordosis since 2019, including subtle anterolisthesis of L4 on L5. L2 superior endplate compression is new since 2019 and suspicious for acute fracture in this setting, with some suspected vertebral body fracture lucency visible. Minimal overall L2 vertebral body loss of height. No retropulsion is evident. L1 and L3 appear intact. L5 appears stable and intact. There is questionable L4 inferior endplate deformity on the lateral views, not apparent on the AP or oblique images. No evidence of pars fracture. Multilevel chronic lumbar facet degeneration. Vance chronic disc and endplate degeneration at L5-S1. Extensive Calcified aortic atherosclerosis. Negative visible bowel gas pattern. Grossly intact visible sacrum and SI joints. IMPRESSION: 1. L2 superior endplate compression fracture, new since 2019 and suspected to be acute. If specific therapy is desired, Lumbar MRI without contrast or Nuclear Medicine Whole-body Bone Scan would confirm candidacy for vertebroplasty. 2. Difficult to exclude L4 inferior endplate compression fracture also, although could be artifact. 3.  Aortic Atherosclerosis (ICD10-I70.0). Electronically Signed   By: Lemmie Evens  [place] Crane M.D.   On: 11/24/2022 07:06   DG Thoracic Spine 2 View  Result Date: 11/24/2022 CLINICAL DATA:  81 year old male with recurrent falls. EXAM: THORACIC SPINE 2 VIEWS COMPARISON:  Portable chest 12/01/2018. Cervical spine CT 11/23/2022. 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 Stable cervicothoracic junction alignment. Normal thoracic segmentation.  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 Relatively preserved thoracic disc spaces. No thoracic compression fracture. Grossly intact posterior ribs. No acute osseous abnormality identified. Respiratory motion. Lower lung volumes. Mediastinal contours are stable. IMPRESSION: 1. No acute osseous abnormality identified in the thoracic spine. 2. 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 Electronically Signed   By: Genevie Ann M.D.   On: 11/24/2022 07:03   CT HEAD WO CONTRAST (5MM)  Result Date: 11/23/2022 CLINICAL DATA:  Trauma. EXAM: CT HEAD WITHOUT CONTRAST CT CERVICAL SPINE WITHOUT CONTRAST TECHNIQUE: Multidetector CT imaging of the head and cervical spine was performed following the standard protocol without intravenous contrast. Multiplanar CT image reconstructions of the cervical spine were also generated. RADIATION DOSE REDUCTION: This exam was performed according to the departmental dose-optimization program which includes automated exposure control, adjustment of the mA and/or kV according to patient size and/or use of iterative reconstruction technique. COMPARISON:  CT dated 05/26/2019. FINDINGS: CT HEAD FINDINGS Brain: Mild age-related atrophy and chronic microvascular ischemic changes. Area of old infarct involving the left cerebellar hemisphere. There is no acute intracranial hemorrhage. No mass effect or midline shift. No extra-axial fluid collection. Vascular: No hyperdense vessel or unexpected calcification. Skull: Normal. Negative for fracture or focal lesion. Sinuses/Orbits: Right maxillary sinus retention cyst or polyp. The remainder of the visualized paranasal sinuses and mastoid air cells are clear. Other: None CT CERVICAL SPINE FINDINGS Alignment: No acute subluxation. Skull base and vertebrae: No acute fracture.  Osteopenia.  Soft tissues and spinal canal: No prevertebral fluid or swelling. No visible canal hematoma. Disc levels: C4-C7 ACDF. Multilevel degenerative changes facet arthropathy. Upper chest: Negative. Other: Bilateral carotid bulb calcified plaques. IMPRESSION: 1. No acute intracranial pathology. Mild age-related atrophy and chronic microvascular ischemic changes. 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 2. No acute/traumatic cervical spine pathology. 3. C4-C7 ACDF. Electronically Signed   By: Anner Crete M.D.   On: 11/23/2022 23:52   CT Cervical Spine Wo Contrast  Result Date: 11/23/2022 CLINICAL DATA:  Trauma. EXAM: CT HEAD WITHOUT CONTRAST CT CERVICAL SPINE WITHOUT CONTRAST TECHNIQUE: Multidetector CT imaging of the head and cervical spine was performed following the standard protocol without intravenous contrast. Multiplanar CT image reconstructions of the cervical spine were also generated. RADIATION DOSE REDUCTION: This exam was performed according to the departmental dose-optimization program which includes automated exposure control, adjustment of the mA and/or kV according to patient size and/or use of iterative reconstruction technique. COMPARISON:  CT dated 05/26/2019. FINDINGS: CT HEAD FINDINGS Brain: Mild age-related atrophy and chronic microvascular ischemic changes. Area of old infarct involving the left cerebellar hemisphere. There is no acute intracranial hemorrhage. No mass effect or midline shift. No extra-axial fluid collection. Vascular: No hyperdense vessel or unexpected calcification. Skull: Normal. Negative for fracture or focal lesion. Sinuses/Orbits: Right maxillary sinus retention cyst or polyp. The remainder of the visualized paranasal sinuses and mastoid air cells are clear. Other: None CT CERVICAL SPINE FINDINGS Alignment: No acute subluxation. Skull base and vertebrae: No acute fracture.  Osteopenia. Soft tissues  and spinal canal: No prevertebral fluid or swelling. No visible canal  hematoma. Disc levels: C4-C7 ACDF. Multilevel degenerative changes facet arthropathy. Upper chest: Negative. Other: Bilateral carotid bulb calcified plaques. IMPRESSION: 1. No acute intracranial pathology. Mild age-related atrophy and chronic microvascular ischemic changes. 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 2. No acute/traumatic cervical spine pathology. 3. C4-C7 ACDF. Electronically Signed   By: Anner Crete M.D.   On: 11/23/2022 23:52    Microbiology: Results for orders placed or performed during the hospital encounter of 11/24/22  Resp panel by RT-PCR (RSV, Flu A&B, Covid) Anterior Nasal Swab     Status: None   Collection Time: 11/23/22 10:49 PM   Specimen: Anterior Nasal Swab  Result Value Ref Range Status   SARS Coronavirus 2 by RT PCR NEGATIVE NEGATIVE Final    Comment: (NOTE) SARS-CoV-2 target nucleic acids are NOT DETECTED.  The SARS-CoV-2 RNA is generally detectable in upper respiratory specimens during the acute phase of infection. The lowest concentration of SARS-CoV-2 viral copies this assay can detect is 138 copies/mL. A negative result does not preclude SARS-Cov-2 infection and should not be used as the sole basis for treatment or other patient management decisions. A negative result may occur with  improper specimen collection/handling, submission of specimen other than nasopharyngeal swab, presence of viral mutation(s) within the areas targeted by this assay, and inadequate number of viral copies(<138 copies/mL). A negative result must be combined with clinical observations, patient history, and epidemiological information. The expected result is Negative.  Fact Sheet for Patients:  EntrepreneurPulse.com.au  Fact Sheet for Healthcare Providers:  IncredibleEmployment.be  This test is no t yet approved or cleared by the [place] FDA and  has been authorized for detection and/or diagnosis of SARS-CoV-2 by FDA under an  Emergency Use Authorization (EUA). This EUA will remain  in effect (meaning this test can be used) for the duration of the COVID-19 declaration under Section 564(b)(1) of the Act, 21 U.S.C.section 360bbb-3(b)(1), unless the authorization is terminated  or revoked sooner.       Influenza A by PCR NEGATIVE NEGATIVE Final   Influenza B by PCR NEGATIVE NEGATIVE Final    Comment: (NOTE) The Xpert Xpress SARS-CoV-2/FLU/RSV plus assay is intended as an aid in the diagnosis of influenza from Nasopharyngeal swab specimens and should not be used as a sole basis for treatment. Nasal washings and aspirates are unacceptable for Xpert Xpress SARS-CoV-2/FLU/RSV testing.  Fact Sheet for Patients: EntrepreneurPulse.com.au  Fact Sheet for Healthcare Providers: IncredibleEmployment.be  This test is not yet approved or cleared by the [place] FDA and has been authorized for detection and/or diagnosis of SARS-CoV-2 by FDA under an Emergency Use Authorization (EUA). This EUA will remain in effect (meaning this test can be used) for the duration of the COVID-19 declaration under Section 564(b)(1) of the Act, 21 U.S.C. section 360bbb-3(b)(1), unless the authorization is terminated or revoked.     Resp Syncytial Virus by PCR NEGATIVE NEGATIVE Final    Comment: (NOTE) Fact Sheet for Patients: EntrepreneurPulse.com.au  Fact Sheet for Healthcare Providers: IncredibleEmployment.be  This test is not yet approved or cleared by the [place] FDA and has been authorized for detection and/or diagnosis of SARS-CoV-2 by FDA under an Emergency Use Authorization (EUA). This EUA will remain in effect (meaning this test can be used) for the duration of the COVID-19 declaration under Section 564(b)(1) of the Act, 21 U.S.C. section 360bbb-3(b)(1), unless the authorization is terminated or revoked.  Performed at [facility],  [place], [place] [address]   Urine Culture     Status: Abnormal   Collection Time: 11/23/22 10:49 PM   Specimen: Urine, Clean Catch  Result Value Ref Range Status   Specimen Description   Final    URINE, CLEAN CATCH Performed at [facility], [address], [place], [place] [address]    Special Requests   Final    NONE Performed at [facility], [address] [place], [place] [address]    Culture MULTIPLE SPECIES PRESENT, SUGGEST RECOLLECTION (A)  Final   Report Status 11/25/2022 FINAL  Final    Labs: CBC: Recent Labs  Lab 11/23/22 2249 11/25/22 0619  WBC 12.6* 13.9*  HGB 13.7 11.7*  HCT 43.0 35.5*  MCV 95.6 94.4  PLT 482* 628*   Basic Metabolic Panel: Recent Labs  Lab 11/23/22 2249 11/25/22 0619  NA 138 140  K 4.4 3.9  CL 105 109  CO2 22 24  GLUCOSE 90 112*  BUN 25* 21  CREATININE 1.21 1.04  CALCIUM 9.8 8.5*   Liver Function Tests: No results for input(s): "AST", "ALT", "ALKPHOS", "BILITOT", "PROT", "ALBUMIN" in the last 168 hours. CBG: No results for input(s): "GLUCAP" in the last 168 hours.  Discharge time spent: greater than 30 minutes.  Signed: Sharen Hones, MD [facility] 11/26/2022

## 2022-11-26 NOTE — Progress Notes (Signed)
Patient discharged to [facility] via EMS. PIVX1 removed with   catheter intact. Report given to Caren Griffins, [place]

## 2022-11-26 NOTE — TOC Transition Note (Signed)
Transition of Care [facility]) - CM/SW Discharge Note   Patient Details  Name: Philip [name] MRN: [id number] Date of Birth: 06-23-1941  [facility]) CM/SW Contact:  Beverly Sessions, RN Phone Number: 11/26/2022, 2:36 PM   Clinical Narrative:     Patient will DC to: [facility] Anticipated DC date:11/26/22  Family notified: Daughter christine Transport CL:[id number]  Per MD patient ready for DC to . RN, patient, patient's family, and facility notified of DC. Discharge Summary sent to facility. RN given number for report. DC packet on chart. Ambulance transport requested for patient.  TOC signing off.  Isaias Cowman [facility] [phone]         Patient Goals and CMS Choice      Discharge Placement                         Discharge Plan and Services Additional resources added to the After Visit Summary for                                       Social Determinants of Health (SDOH) Interventions SDOH Screenings   Tobacco Use: Medium Risk (11/23/2022)     Readmission Risk Interventions     No data to display

## 2022-11-26 NOTE — Progress Notes (Signed)
Orthopedic Tech Progress Note Patient Details:  Philip [name] 20-Nov-1941 [id number] LSO Brace was ordered from [facility] along with a request from the MD for a sizing in order to have a custom brace made for this patient due to the last brace interfering with his colostomy bag. RN was made aware that these braces have been ordered and also the timeframe in which the patient will receive his custom brace.  Patient ID: Philip [name], male   DOB: 1941-07-12, 81 y.o.   MRN: [id number]  Philip [name] 11/26/2022, 12:20 PM

## 2022-11-26 NOTE — Plan of Care (Signed)
  Problem: Education: Goal: Knowledge of General Education information will improve Description: Including pain rating scale, medication(s)/side effects and non-pharmacologic comfort measures 11/26/2022 1552 by Evelena Peat, RN Outcome: Completed/Met 11/26/2022 1228 by Evelena Peat, RN Outcome: Progressing   Problem: Health Behavior/Discharge Planning: Goal: Ability to manage health-related needs will improve 11/26/2022 1552 by Evelena Peat, RN Outcome: Completed/Met 11/26/2022 1228 by Evelena Peat, RN Outcome: Progressing   Problem: Clinical Measurements: Goal: Ability to maintain clinical measurements within normal limits will improve 11/26/2022 1552 by Evelena Peat, RN Outcome: Completed/Met 11/26/2022 1228 by Evelena Peat, RN Outcome: Progressing Goal: Will remain free from infection 11/26/2022 1552 by Evelena Peat, RN Outcome: Completed/Met 11/26/2022 1228 by Evelena Peat, RN Outcome: Progressing Goal: Diagnostic test results will improve 11/26/2022 1552 by Evelena Peat, RN Outcome: Completed/Met 11/26/2022 1228 by Evelena Peat, RN Outcome: Progressing Goal: Respiratory complications will improve 11/26/2022 1552 by Evelena Peat, RN Outcome: Completed/Met 11/26/2022 1228 by Evelena Peat, RN Outcome: Progressing Goal: Cardiovascular complication will be avoided 11/26/2022 1552 by Evelena Peat, RN Outcome: Completed/Met 11/26/2022 1228 by Evelena Peat, RN Outcome: Progressing   Problem: Activity: Goal: Risk for activity intolerance will decrease 11/26/2022 1552 by Evelena Peat, RN Outcome: Completed/Met 11/26/2022 1228 by Evelena Peat, RN Outcome: Progressing   Problem: Coping: Goal: Level of anxiety will decrease 11/26/2022 1552 by Evelena Peat, RN Outcome: Completed/Met 11/26/2022 1228 by Evelena Peat, RN Outcome: Progressing   Problem:  Elimination: Goal: Will not experience complications related to bowel motility 11/26/2022 1552 by Evelena Peat, RN Outcome: Completed/Met 11/26/2022 1228 by Evelena Peat, RN Outcome: Progressing Goal: Will not experience complications related to urinary retention 11/26/2022 1552 by Evelena Peat, RN Outcome: Completed/Met 11/26/2022 1228 by Evelena Peat, RN Outcome: Progressing

## 2022-12-01 ENCOUNTER — Encounter: Payer: Self-pay | Admitting: Student

## 2022-12-01 ENCOUNTER — Non-Acute Institutional Stay (SKILLED_NURSING_FACILITY): Payer: Medicare Other | Admitting: Student

## 2022-12-01 DIAGNOSIS — Z66 Do not resuscitate: Secondary | ICD-10-CM

## 2022-12-01 DIAGNOSIS — N1831 Chronic kidney disease, stage 3a: Secondary | ICD-10-CM

## 2022-12-01 DIAGNOSIS — S32020A Wedge compression fracture of second lumbar vertebra, initial encounter for closed fracture: Secondary | ICD-10-CM

## 2022-12-01 DIAGNOSIS — I10 Essential (primary) hypertension: Secondary | ICD-10-CM

## 2022-12-01 DIAGNOSIS — Y92009 Unspecified place in unspecified non-institutional (private) residence as the place of occurrence of the external cause: Secondary | ICD-10-CM

## 2022-12-01 DIAGNOSIS — W19XXXA Unspecified fall, initial encounter: Secondary | ICD-10-CM | POA: Diagnosis not present

## 2022-12-01 DIAGNOSIS — N3000 Acute cystitis without hematuria: Secondary | ICD-10-CM

## 2022-12-01 NOTE — Progress Notes (Addendum)
Provider:  Dr. Dewayne Shorter Location:  [facility] Room Number: [facility] 111A Place of Service:  SNF (31)  PCP: Kirk Ruths, MD Patient Care Team: Kirk Ruths, MD as PCP - General (Internal Medicine) Billey Co, MD as Consulting Physician (Urology) Lloyd Huger, MD as Consulting Physician (Oncology) Lucky Cowboy Erskine Squibb, MD as Referring Physician (Vascular Surgery)  Extended Emergency Contact Information Primary Emergency Contact: [place] Phone: [phone] Mobile Phone: [phone] Relation: Daughter Secondary Emergency Contact: warner,[name] Mobile Phone: [phone] Relation: Daughter Interpreter needed? No  Code Status: DNR Goals of Care: Advanced Directive information    12/01/2022    9:08 AM  Advanced Directives  Does Patient Have a Medical Advance Directive? Yes  Type of Advance Directive Living will;Healthcare Power of [place];Out of facility DNR (pink MOST or yellow form)  Does patient want to make changes to medical advance directive? No - Patient declined  Copy of [place] in Chart? No - copy requested      Chief Complaint  Patient presents with   New Admit To SNF    Admission   He  HPI: Patient is a 82 y.o. male seen today for admission to [facility] after hospitalization. He was found to have a UTI after a fall.   He went to the hospital after a fall. EMT came and said he had to go to the hospital. Martin Majestic to the hospital and they did a CT scan and then an MRI to make sure there was no blood on the brain and sent him here. He has a spine fracture and has been using the pain patch which has helped. He didn't have any confusion while in the hospital.   Other falls this year - He probably had 3-4 in 2023. He has lived in a [place] here at [facility] for the last 4 years. Before that he lived in [place] area. He is retired from [employer]. Before [place] was in [place]. He  has a daughter that lives in [place]. His daughter Altha Harm is the first point of contact. He had someone clean for him every 2 weeks. He used a cane and sometimes a rollator for ambulation. He has been driving. He sleeps well, urinating fine, has good bowel movements. 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   It's his impression that he will be here for long term care.    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 chin. Nodular and infiltrative patterns.EDC.   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 Nodular. EDC   Basal cell carcinoma 08/26/2020   Right mid to inf. ear helix. Nodular, ulcerated. 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 Ulcerated.   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   L upper eyelid, exc and ED, pt had BCC of L upper eyelid and excision also included this SCC   Past Surgical History:  Procedure Laterality Date   BACK SURGERY  CATARACT EXTRACTION     HERNIA REPAIR     PORTA CATH INSERTION N/A 01/01/2019   Procedure: PORTA CATH INSERTION;  Surgeon: Algernon Huxley, MD;  Location: [facility];  Service: Cardiovascular;  Laterality: N/A;   TONSILLECTOMY     TRANSURETHRAL RESECTION OF BLADDER TUMOR N/A 12/08/2018   Procedure: TRANSURETHRAL RESECTION OF BLADDER TUMOR (TURBT);  Surgeon: Billey Co, MD;  Location: [facility] ORS;  Service: Urology;  Laterality: N/A;    reports that he quit smoking about 29 years ago. His smoking use included cigarettes. He has never used smokeless tobacco. He  reports current alcohol use. He reports that he does not use drugs. Social History   Socioeconomic History   Marital status: Widowed    Spouse name: Not on file   Number of children: 4   Years of education: Not on file   Highest education level: Not on file  Occupational History   Not on file  Tobacco Use   Smoking status: Former    Years: 35.00    Types: Cigarettes    Quit date: 11/29/1993    Years since quitting: 29.0   Smokeless tobacco: Never  Vaping Use   Vaping Use: Never used  Substance and Sexual Activity   Alcohol use: Yes    Comment: a couple of drinks a day all the above   Drug use: Never   Sexual activity: Not Currently    Partners: Female  Other Topics Concern   Not on file  Social History Narrative   Not on file   Social Determinants of Health   Financial Resource Strain: Not on file  Food Insecurity: Not on file  Transportation Needs: Not on file  Physical Activity: Not on file  Stress: Not on file  Social Connections: Not on file  Intimate Partner Violence: Not on file    Functional Status Survey:    Family History  Problem Relation Age of Onset   Heart failure Mother     Health Maintenance  Topic Date Due   Medicare Annual Wellness (AWV)  Never done   DTaP/Tdap/Td (1 - Tdap) Never done   Zoster Vaccines- Shingrix (1 of 2) Never done   COVID-19 Vaccine (4 - 2023-24 season) 07/30/2022   Pneumonia Vaccine 54+ Years old  Completed   INFLUENZA VACCINE  Completed   HPV VACCINES  Aged Out    No Known Allergies  Outpatient Encounter Medications as of 12/01/2022  Medication Sig   atorvastatin (LIPITOR) 20 MG tablet Take 20 mg by mouth daily.    lidocaine (LIDODERM) 5 % Place 1 patch onto the skin daily. Remove & Discard patch within 12 hours or as directed by MD   lisinopril-hydrochlorothiazide (ZESTORETIC) 20-12.5 MG tablet Take 1 tablet by mouth daily.   metoprolol succinate (TOPROL-XL) 25 MG 24 hr tablet Take 25 mg by mouth daily.   Multiple  Vitamins-Minerals (CENTRUM MEN) TABS Take 1 tablet by mouth daily.   oxyCODONE-acetaminophen (PERCOCET/ROXICET) 5-325 MG tablet Take 1 tablet by mouth every 6 (six) hours as needed for moderate pain.   Vitamin D, Ergocalciferol, (DRISDOL) 1.25 MG (50000 UT) CAPS capsule Take 50,000 Units by mouth every 7 (seven) days.   [DISCONTINUED] Multiple Vitamins-Minerals (CENTRUM WOMEN) TABS Take 1 tablet by mouth daily.   No facility-administered encounter medications on file as of 12/01/2022.    Review of Systems  All other systems reviewed and are negative.   Vitals:   12/01/22 0903  BP: (!) 97/56  Pulse:  70  Resp: 20  Temp: (!) 97.5 F (36.4 C)  SpO2: 93%  Weight: 152 lb 6.4 oz (69.1 kg)  Height: '5\' 8"'$  (1.727 m)   Body mass index is 23.17 kg/m. Physical Exam Vitals reviewed.  Constitutional:      Appearance: Normal appearance.  Cardiovascular:     Rate and Rhythm: Normal rate.     Pulses: Normal pulses.  Pulmonary:     Effort: Pulmonary effort is normal.  Abdominal:     Palpations: Abdomen is soft.  Skin:    General: Skin is warm and dry.  Neurological:     Mental Status: He is alert and oriented to person, place, and time.     Labs reviewed: Basic Metabolic Panel: Recent Labs    11/23/22 2249 11/25/22 0619  NA 138 140  K 4.4 3.9  CL 105 109  CO2 22 24  GLUCOSE 90 112*  BUN 25* 21  CREATININE 1.21 1.04  CALCIUM 9.8 8.5*   Liver Function Tests: No results for input(s): "AST", "ALT", "ALKPHOS", "BILITOT", "PROT", "ALBUMIN" in the last 8760 hours. No results for input(s): "LIPASE", "AMYLASE" in the last 8760 hours. No results for input(s): "AMMONIA" in the last 8760 hours. CBC: Recent Labs    11/23/22 2249 11/25/22 0619  WBC 12.6* 13.9*  HGB 13.7 11.7*  HCT 43.0 35.5*  MCV 95.6 94.4  PLT 482* 433*   Cardiac Enzymes: No results for input(s): "CKTOTAL", "CKMB", "CKMBINDEX", "TROPONINI" in the last 8760 hours. BNP: Invalid input(s): "POCBNP" No results  found for: "HGBA1C" No results found for: "TSH" No results found for: "VITAMINB12" No results found for: "FOLATE" No results found for: "IRON", "TIBC", "FERRITIN"  Imaging and Procedures obtained prior to SNF admission: MR BRAIN WO CONTRAST  Result Date: 11/24/2022 CLINICAL DATA:  Recurrent ataxia.  Bladder cancer. EXAM: MRI HEAD WITHOUT CONTRAST TECHNIQUE: Multiplanar, multiecho pulse sequences of the brain and surrounding structures were obtained without intravenous contrast. COMPARISON:  CT head 11/23/2022 FINDINGS: Brain: Moderate atrophy. Negative for acute infarct, hemorrhage, mass Chronic microvascular ischemic change in the white matter and pons. Chronic infarct left cerebellum. Vascular: Normal arterial flow voids Skull and upper cervical spine: No focal skeletal lesion. Sinuses/Orbits: Mucosal edema paranasal sinuses. Retention cyst right maxillary sinus. Bilateral cataract extraction Other: None IMPRESSION: 1. Negative for acute infarct or mass. 2. Atrophy and moderate chronic microvascular ischemic change. Chronic infarct left cerebellum. Electronically Signed   By: Franchot Gallo M.D.   On: 11/24/2022 11:12   DG Lumbar Spine Complete  Result Date: 11/24/2022 CLINICAL DATA:  82 year old male with recurrent falls. EXAM: LUMBAR SPINE - COMPLETE 4+ VIEW COMPARISON:  Thoracic radiographs today. CT Abdomen and Pelvis 11/23/2018. FINDINGS: Normal lumbar segmentation, concordant with thoracic numbering today. Stable lumbar lordosis since 2019, including subtle anterolisthesis of L4 on L5. L2 superior endplate compression is new since 2019 and suspicious for acute fracture in this setting, with some suspected vertebral body fracture lucency visible. Minimal overall L2 vertebral body loss of height. No retropulsion is evident. L1 and L3 appear intact. L5 appears stable and intact. There is questionable L4 inferior endplate deformity on the lateral views, not apparent on the AP or oblique images. No  evidence of pars fracture. Multilevel chronic lumbar facet degeneration. Vance chronic disc and endplate degeneration at L5-S1. Extensive Calcified aortic atherosclerosis. Negative visible bowel gas pattern. Grossly intact visible sacrum and SI joints. IMPRESSION: 1. L2 superior endplate compression fracture, new since 2019 and suspected to be acute. If specific therapy  is desired, Lumbar MRI without contrast or Nuclear Medicine Whole-body Bone Scan would confirm candidacy for vertebroplasty. 2. Difficult to exclude L4 inferior endplate compression fracture also, although could be artifact. 3.  Aortic Atherosclerosis (ICD10-I70.0). Electronically Signed   By: Genevie Ann M.D.   On: 11/24/2022 07:06   DG Thoracic Spine 2 View  Result Date: 11/24/2022 CLINICAL DATA:  82 year old male with recurrent falls. EXAM: THORACIC SPINE 2 VIEWS COMPARISON:  Portable chest 12/01/2018. Cervical spine CT 11/23/2022. 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 Stable cervicothoracic junction alignment. Normal thoracic segmentation. 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 Relatively preserved thoracic disc spaces. No thoracic compression fracture. Grossly intact posterior ribs. No acute osseous abnormality identified. Respiratory motion. Lower lung volumes. Mediastinal contours are stable. IMPRESSION: 1. No acute osseous abnormality identified in the thoracic spine. 2. 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 Electronically Signed   By: Genevie Ann M.D.   On: 11/24/2022 07:03    Assessment/Plan Compression fracture of L2 vertebra, initial encounter [facility]) Fall  11/24/2022. LSO brace. PT/OT. Pain control as indicated.  Patient with numerous falls in the last few  months. NO physical therapy prevoiusly. New L2 fracture. Plan for PT. Denied symptoms at the time of falls. Multiple blood pressure medications and low BP, will discontinue lisinopril and HCTZ at this time to help with blood pressure control and try to prevent falls. Percocet for pain control. Lidoderm patches as well.    3. DNR Patient has DNR paperwork.   4. Essential hypertension BP low today. Discontinue zestoretic and monitor  BP. Could have contribute dto fall. Continue metoprolol.   5. Acute cystitis without hematuria  Urostomy Present Urine culture with multiple organisms and abx discontinued. CTM.   6. Chronic kidney disease, stage 3a (HCC) Avoid nephrotoxic agents.  Creatinine, Ser  Date/Time Value Ref Range Status  11/25/2022 06:19 AM 1.04 0.61 - 1.24 mg/dL Final      Family/ staff Communication: Nursing  Labs/tests ordered: BMP CBC

## 2022-12-13 ENCOUNTER — Encounter: Payer: Self-pay | Admitting: Oncology

## 2022-12-20 ENCOUNTER — Encounter: Payer: Self-pay | Admitting: Oncology

## 2022-12-20 ENCOUNTER — Ambulatory Visit (INDEPENDENT_AMBULATORY_CARE_PROVIDER_SITE_OTHER): Payer: Medicare Other | Admitting: Dermatology

## 2022-12-20 DIAGNOSIS — L578 Other skin changes due to chronic exposure to nonionizing radiation: Secondary | ICD-10-CM | POA: Diagnosis not present

## 2022-12-20 DIAGNOSIS — L821 Other seborrheic keratosis: Secondary | ICD-10-CM | POA: Diagnosis not present

## 2022-12-20 DIAGNOSIS — L820 Inflamed seborrheic keratosis: Secondary | ICD-10-CM | POA: Diagnosis not present

## 2022-12-20 NOTE — Patient Instructions (Addendum)
Seborrheic Keratosis  What causes seborrheic keratoses? Seborrheic keratoses are harmless, common skin growths that first appear during adult life.  As time goes by, more growths appear.  Some people may develop a large number of them.  Seborrheic keratoses appear on both covered and uncovered body parts.  They are not caused by sunlight.  The tendency to develop seborrheic keratoses can be inherited.  They vary in color from skin-colored to gray, brown, or even black.  They can be either smooth or have a rough, warty surface.   Seborrheic keratoses are superficial and look as if they were stuck on the skin.  Under the microscope this type of keratosis looks like layers upon layers of skin.  That is why at times the top layer may seem to fall off, but the rest of the growth remains and re-grows.    Treatment Seborrheic keratoses do not need to be treated, but can easily be removed in the office.  Seborrheic keratoses often cause symptoms when they rub on clothing or jewelry.  Lesions can be in the way of shaving.  If they become inflamed, they can cause itching, soreness, or burning.  Removal of a seborrheic keratosis can be accomplished by freezing, burning, or surgery. If any spot bleeds, scabs, or grows rapidly, please return to have it checked, as these can be an indication of a skin cancer.  Cryotherapy Aftercare  Wash gently with soap and water everyday.   Apply Vaseline and Band-Aid daily until healed.    Due to recent changes in healthcare laws, you may see results of your pathology and/or laboratory studies on MyChart before the doctors have had a chance to review them. We understand that in some cases there may be results that are confusing or concerning to you. Please understand that not all results are received at the same time and often the doctors may need to interpret multiple results in order to provide you with the best plan of care or course of treatment. Therefore, we ask that you  please give us 2 business days to thoroughly review all your results before contacting the office for clarification. Should we see a critical lab result, you will be contacted sooner.   If You Need Anything After Your Visit  If you have any questions or concerns for your doctor, please call our main line at [phone] and press option 4 to reach your doctor's medical assistant. If no one answers, please leave a voicemail as directed and we will return your call as soon as possible. Messages left after 4 pm will be answered the following business day.   You may also send us a message via MyChart. We typically respond to MyChart messages within 1-2 business days.  For prescription refills, please ask your pharmacy to contact our office. Our fax number is [phone].  If you have an urgent issue when the clinic is closed that cannot wait until the next business day, you can page your doctor at the number below.    Please note that while we do our best to be available for urgent issues outside of office hours, we are not available 24/7.   If you have an urgent issue and are unable to reach us, you may choose to seek medical care at your doctor's office, retail clinic, urgent care center, or emergency room.  If you have a medical emergency, please immediately call 911 or go to the emergency department.  Pager Numbers  - Dr. Kowalski: [phone]  -   Dr. Moye: [phone]  - Dr. Stewart: [phone]  In the event of inclement weather, please call our main line at [phone] for an update on the status of any delays or closures.  Dermatology Medication Tips: Please keep the boxes that topical medications come in in order to help keep track of the instructions about where and how to use these. Pharmacies typically print the medication instructions only on the boxes and not directly on the medication tubes.   If your medication is too expensive, please contact our office at  [phone] option 4 or send us a message through MyChart.   We are unable to tell what your co-pay for medications will be in advance as this is different depending on your insurance coverage. However, we may be able to find a substitute medication at lower cost or fill out paperwork to get insurance to cover a needed medication.   If a prior authorization is required to get your medication covered by your insurance company, please allow us 1-2 business days to complete this process.  Drug prices often vary depending on where the prescription is filled and some pharmacies may offer cheaper prices.  The website www.goodrx.com contains coupons for medications through different pharmacies. The prices here do not account for what the cost may be with help from insurance (it may be cheaper with your insurance), but the website can give you the price if you did not use any insurance.  - You can print the associated coupon and take it with your prescription to the pharmacy.  - You may also stop by our office during regular business hours and pick up a GoodRx coupon card.  - If you need your prescription sent electronically to a different pharmacy, notify our office through [facility] MyChart or by phone at [phone] option 4.     Si Usted Necesita Algo Despus de Su Visita  Tambin puede enviarnos un mensaje a travs de MyChart. Por lo general respondemos a los mensajes de MyChart en el transcurso de 1 a 2 das hbiles.  Para renovar recetas, por favor pida a su farmacia que se ponga en contacto con nuestra oficina. Nuestro nmero de fax es el [phone].  Si tiene un asunto urgente cuando la clnica est cerrada y que no puede esperar hasta el siguiente da hbil, puede llamar/localizar a su doctor(a) al nmero que aparece a continuacin.   Por favor, tenga en cuenta que aunque hacemos todo lo posible para estar disponibles para asuntos urgentes fuera del horario de oficina, no estamos  disponibles las 24 horas del da, los 7 das de la semana.   Si tiene un problema urgente y no puede comunicarse con nosotros, puede optar por buscar atencin mdica  en el consultorio de su doctor(a), en una clnica privada, en un centro de atencin urgente o en una sala de emergencias.  Si tiene una emergencia mdica, por favor llame inmediatamente al 911 o vaya a la sala de emergencias.  Nmeros de bper  - Dr. Kowalski: [phone]  - Dra. Moye: [phone]  - Dra. Stewart: [phone]  En caso de inclemencias del tiempo, por favor llame a nuestra lnea principal al [phone] para una actualizacin sobre el estado de cualquier retraso o cierre.  Consejos para la medicacin en dermatologa: Por favor, guarde las cajas en las que vienen los medicamentos de uso tpico para ayudarle a seguir las instrucciones sobre dnde y cmo usarlos. Las farmacias generalmente imprimen las instrucciones del medicamento slo en las cajas y   no directamente en los tubos del medicamento.   Si su medicamento es muy caro, por favor, pngase en contacto con nuestra oficina llamando al [phone] y presione la opcin 4 o envenos un mensaje a travs de MyChart.   No podemos decirle cul ser su copago por los medicamentos por adelantado ya que esto es diferente dependiendo de la cobertura de su seguro. Sin embargo, es posible que podamos encontrar un medicamento sustituto a menor costo o llenar un formulario para que el seguro cubra el medicamento que se considera necesario.   Si se requiere una autorizacin previa para que su compaa de seguros cubra su medicamento, por favor permtanos de 1 a 2 das hbiles para completar este proceso.  Los precios de los medicamentos varan con frecuencia dependiendo del lugar de dnde se surte la receta y alguna farmacias pueden ofrecer precios ms baratos.  El sitio web www.goodrx.com tiene cupones para medicamentos de diferentes farmacias. Los precios aqu no  tienen en cuenta lo que podra costar con la ayuda del seguro (puede ser ms barato con su seguro), pero el sitio web puede darle el precio si no utiliz ningn seguro.  - Puede imprimir el cupn correspondiente y llevarlo con su receta a la farmacia.  - Tambin puede pasar por nuestra oficina durante el horario de atencin regular y recoger una tarjeta de cupones de GoodRx.  - Si necesita que su receta se enve electrnicamente a una farmacia diferente, informe a nuestra oficina a travs de MyChart de [place] o por telfono llamando al [phone] y presione la opcin 4.  

## 2022-12-20 NOTE — Progress Notes (Signed)
   Follow-Up Visit   Subjective  Philip [name] is a 82 y.o. male who presents for the following: Actinic Keratosis (Patient here today for ak follow up. Reports some areas on left forehead and behind left ear. ). The patient has spots, moles and lesions to be evaluated, some may be new or changing and the patient has concerns that these could be cancer.  The following portions of the chart were reviewed this encounter and updated as appropriate:  Tobacco  Allergies  Meds  Problems  Med Hx  Surg Hx  Fam Hx     Review of Systems: No other skin or systemic complaints except as noted in HPI or Assessment and Plan.  Objective  Well appearing patient in no apparent distress; mood and affect are within normal limits.  A focused examination was performed including face, scalp, ears. Relevant physical exam findings are noted in the Assessment and Plan.  supierior lateral forehead x 1, left posterior ear x 1, scalp x 7, face x 2 (11) Erythematous stuck-on, waxy papule or plaque          Assessment & Plan  Inflamed seborrheic keratosis (11) supierior lateral forehead x 1, left posterior ear x 1, scalp x 7, face x 2 Symptomatic, irritating, patient would like treated. Will recheck in 2 months left posterior ear and superior lateral forehead   If not resolved may consider bx.  Photos today   Destruction of lesion - supierior lateral forehead x 1, left posterior ear x 1, scalp x 7, face x 2 Complexity: simple   Destruction method: cryotherapy   Informed consent: discussed and consent obtained   Timeout:  patient name, date of birth, surgical site, and procedure verified Lesion destroyed using liquid nitrogen: Yes   Region frozen until ice ball extended beyond lesion: Yes   Outcome: patient tolerated procedure well with no complications   Post-procedure details: wound care instructions given   Additional details:  Prior to procedure, discussed risks of blister formation, small  wound, skin dyspigmentation, or rare scar following cryotherapy. Recommend Vaseline ointment to treated areas while healing.  Seborrheic Keratoses - Stuck-on, waxy, tan-brown papules and/or plaques  - Benign-appearing - Discussed benign etiology and prognosis. - Observe - Call for any changes  Actinic Damage - chronic, secondary to cumulative UV radiation exposure/sun exposure over time - diffuse scaly erythematous macules with underlying dyspigmentation - Recommend daily broad spectrum sunscreen SPF 30+ to sun-exposed areas, reapply every 2 hours as needed.  - Recommend staying in the shade or wearing long sleeves, sun glasses (UVA+UVB protection) and wide brim hats (4-inch brim around the entire circumference of the hat). - Call for new or changing lesions.  Return for 2 month ak and isk follow up.  IRuthell Rummage, CMA, am acting as scribe for Sarina Ser, MD. Documentation: I have reviewed the above documentation for accuracy and completeness, and I agree with the above.  Sarina Ser, MD

## 2022-12-29 ENCOUNTER — Encounter: Payer: Self-pay | Admitting: Dermatology

## 2023-01-26 ENCOUNTER — Non-Acute Institutional Stay (SKILLED_NURSING_FACILITY): Payer: Medicare Other | Admitting: Student

## 2023-01-26 ENCOUNTER — Encounter: Payer: Self-pay | Admitting: Student

## 2023-01-26 DIAGNOSIS — I10 Essential (primary) hypertension: Secondary | ICD-10-CM | POA: Diagnosis not present

## 2023-01-26 DIAGNOSIS — S32020A Wedge compression fracture of second lumbar vertebra, initial encounter for closed fracture: Secondary | ICD-10-CM | POA: Diagnosis not present

## 2023-01-26 DIAGNOSIS — E7800 Pure hypercholesterolemia, unspecified: Secondary | ICD-10-CM

## 2023-01-26 DIAGNOSIS — N1831 Chronic kidney disease, stage 3a: Secondary | ICD-10-CM

## 2023-01-26 NOTE — Progress Notes (Signed)
Location:  Other [facility].  Nursing Home Room Number: [place] of Service:  SNF [phone]) Provider:  Dr. Dewayne Shorter  VR:[id number], Philip Cornfield, MD  Patient Care Team: Kirk Ruths, MD as PCP - General (Internal Medicine) Billey Co, MD as Consulting Physician (Urology) Lloyd Huger, MD as Consulting Physician (Oncology) Lucky Cowboy Erskine Squibb, MD as Referring Physician (Vascular Surgery)  Extended Emergency Contact Information Primary Emergency Contact: Sims Phone: [phone] Mobile Phone: [phone] Relation: Daughter Secondary Emergency Contact: warner,[name] Mobile Phone: [phone] Relation: Daughter Interpreter needed? No  Code Status:  DNR Goals of care: Advanced Directive information    01/26/2023    9:05 AM  Advanced Directives  Does Patient Have a Medical Advance Directive? Yes  Type of Paramedic of [place];Living will;Out of facility DNR (pink MOST or yellow form)  Does patient want to make changes to medical advance directive? No - Patient declined  Copy of [place] in Chart? Yes - validated most recent copy scanned in chart (See row information)     Chief Complaint  Patient presents with   Medical Management of Chronic Issues    Medical Management of Chronic Issues.     HPI:  Pt is a 82 y.o. male seen today for medical management of chronic diseases.    He fell yesterday and rolled out of bed and tried to grab his food table and everything slid away from him. He has three skin tears - one on the leg and two  on the left arm. He didn't hit his head and hasn't had any changes.  His transition here has been wonderful from independent living. It's been very supportive. He no longer drives. This has become his little world here.   His mood has been fine. He denies feeling down or depressed lately. Denies little   He is currently writing his memoir. He is adding  some pictures into it. He is planning to attach it to the email and send it to riends and see if they would like to read it. It's about 20 pages right now.   Therapy has been going well for him. He was told recently it was done. He would like to keep going with therapy if possible.   He would like to transition PCP to [facility].   He says he is drinking plenty of fluids- his blood pressure is low. He drinks about 3-4 16 ounce cups per day. He hasn't worn compression stockings lately. He remembers they were a real nuisance.   He doesn't have any concerns about his memory.   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 chin. Nodular and infiltrative patterns.EDC.   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 Nodular. EDC   Basal cell carcinoma 08/26/2020   Right mid to inf. ear helix. Nodular, ulcerated. 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 Ulcerated.   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   L upper eyelid, exc and ED, pt had  BCC of L upper eyelid and excision also included this SCC   Past Surgical History:  Procedure Laterality Date   BACK SURGERY     CATARACT EXTRACTION     HERNIA REPAIR     PORTA CATH INSERTION N/A 01/01/2019   Procedure: PORTA CATH INSERTION;  Surgeon: Algernon Huxley, MD;  Location: [facility];  Service: Cardiovascular;  Laterality: N/A;   TONSILLECTOMY     TRANSURETHRAL RESECTION OF BLADDER TUMOR N/A 12/08/2018   Procedure: TRANSURETHRAL RESECTION OF BLADDER TUMOR (TURBT);  Surgeon: Billey Co, MD;  Location: [facility]  ORS;  Service: Urology;  Laterality: N/A;    No Known Allergies  Outpatient Encounter Medications as of 01/26/2023  Medication Sig   atorvastatin (LIPITOR) 20 MG tablet Take 20 mg by mouth daily.    hydrocortisone cream 1 % Apply to Pelvic area topically two times daily   lidocaine (LIDODERM) 5 % Place 1 patch onto the skin daily. Remove & Discard patch within 12 hours or as directed by MD   magnesium chloride (SLOW-MAG) 64 MG TBEC SR tablet Take 1 tablet by mouth at bedtime.   metoprolol succinate (TOPROL-XL) 25 MG 24 hr tablet Take 25 mg by mouth daily.   Multiple Vitamins-Minerals (CENTRUM MEN) TABS Take 1 tablet by mouth daily.   Vitamin D, Ergocalciferol, (DRISDOL) 1.25 MG (50000 UT) CAPS capsule Take 50,000 Units by mouth every 7 (seven) days.   No facility-administered encounter medications on file as of 01/26/2023.    Review of Systems  Immunization History  Administered Date(s) Administered   Influenza Inj Mdck Quad Pf 08/25/2021, 08/31/2022   Influenza-Unspecified 08/27/2014, 09/03/2015, 08/31/2016, 08/29/2017   Moderna Sars-Covid-2 Vaccination 12/11/2019, 01/08/2020, 10/08/2020, 08/20/2021   Pneumococcal Conjugate-13 03/21/2014, 06/02/2017   Pneumococcal Polysaccharide-23 04/08/2016   Pertinent  Health Maintenance Due  Topic Date Due   INFLUENZA VACCINE  Completed      05/26/2019    4:21 PM 11/23/2022   10:33 PM 11/24/2022   10:00 PM 11/25/2022    8:30 PM 11/26/2022    9:00 AM  Fall Risk  (RETIRED) Patient Fall Risk Level High fall risk High fall risk High fall risk High fall risk High fall risk   Functional Status Survey:    Vitals:   01/26/23 0857  BP: 114/67  Pulse: 64  Resp: 18  Temp: 97.9 F (36.6 C)  SpO2: 96%  Weight: 151 lb 12.8 oz (68.9 kg)  Height: '5\' 8"'$  (1.727 m)   Body mass index is 23.08 kg/m. Physical Exam  Labs reviewed: Recent Labs    11/23/22 2249 11/25/22 0619  NA 138 140  K 4.4 3.9  CL 105 109  CO2 22 24  GLUCOSE 90 112*   BUN 25* 21  CREATININE 1.21 1.04  CALCIUM 9.8 8.5*   No results for input(s): "AST", "ALT", "ALKPHOS", "BILITOT", "PROT", "ALBUMIN" in the last 8760 hours. Recent Labs    11/23/22 2249 11/25/22 0619  WBC 12.6* 13.9*  HGB 13.7 11.7*  HCT 43.0 35.5*  MCV 95.6 94.4  PLT 482* 433*   No results found for: "TSH" No results found for: "HGBA1C" No results found for: "CHOL", "HDL", "LDLCALC", "LDLDIRECT", "TRIG", "CHOLHDL"  Significant Diagnostic Results in last 30 days:  No results found.  Assessment/Plan Compression fracture of L2 vertebra, initial encounter ([place])  Chronic kidney disease, stage 3a ([place])  Essential hypertension  Hypercholesterolemia DOI 11/24/22. PT is going well. No longer having issues with pain. BP well-controlled, however, had a recent fall.  Will need to determine need for decreasing medications based on these findings. Urostomy self managed. Patient desires nightly wine available, will have order placed. Continue lidoderm and tylenol PRN. Continue Atorvastatin. Continue Vitamin D supplementation - discuss osteoporosis treatment given the presence of ground-level fall.    Family/ staff Communication: nursing, will call daughter at a later date.   Labs/tests ordered:  Quarterly BMP. Annual Vitamin D, PTH.

## 2023-01-27 LAB — CBC AND DIFFERENTIAL
HCT: 36 — AB (ref 41–53)
Hemoglobin: 12.1 — AB (ref 13.5–17.5)
Platelets: 419 10*3/uL — AB (ref 150–400)
WBC: 7.8

## 2023-01-27 LAB — BASIC METABOLIC PANEL
BUN: 30 — AB (ref 4–21)
CO2: 30 — AB (ref 13–22)
Chloride: 103 (ref 99–108)
Creatinine: 1.2 (ref 0.6–1.3)
Glucose: 88
Potassium: 4.7 mEq/L (ref 3.5–5.1)
Sodium: 137 (ref 137–147)

## 2023-01-27 LAB — COMPREHENSIVE METABOLIC PANEL
Albumin: 3.3 — AB (ref 3.5–5.0)
Calcium: 8.9 (ref 8.7–10.7)
Globulin: 2.8
eGFR: 59

## 2023-01-27 LAB — HEPATIC FUNCTION PANEL
ALT: 11 U/L (ref 10–40)
AST: 12 — AB (ref 14–40)
Alkaline Phosphatase: 91 (ref 25–125)
Bilirubin, Total: 0.4

## 2023-01-27 LAB — CBC: RBC: 3.94 (ref 3.87–5.11)

## 2023-02-01 ENCOUNTER — Encounter: Payer: Self-pay | Admitting: Oncology

## 2023-02-17 ENCOUNTER — Non-Acute Institutional Stay (SKILLED_NURSING_FACILITY): Payer: Medicare Other | Admitting: Nurse Practitioner

## 2023-02-17 ENCOUNTER — Encounter: Payer: Self-pay | Admitting: Nurse Practitioner

## 2023-02-17 DIAGNOSIS — R296 Repeated falls: Secondary | ICD-10-CM | POA: Diagnosis not present

## 2023-02-17 DIAGNOSIS — E7800 Pure hypercholesterolemia, unspecified: Secondary | ICD-10-CM

## 2023-02-17 DIAGNOSIS — N1831 Chronic kidney disease, stage 3a: Secondary | ICD-10-CM

## 2023-02-17 DIAGNOSIS — S32020D Wedge compression fracture of second lumbar vertebra, subsequent encounter for fracture with routine healing: Secondary | ICD-10-CM

## 2023-02-17 DIAGNOSIS — I10 Essential (primary) hypertension: Secondary | ICD-10-CM

## 2023-02-17 NOTE — Progress Notes (Unsigned)
Location:  Other [facility].  Nursing Home Room Number: [place] of Service:  SNF [phone]) Provider:  Sherrie Mustache, NP  PCP: Dewayne Shorter, MD  Patient Care Team: Dewayne Shorter, MD as PCP - General (Family Medicine) Billey Co, MD as Consulting Physician (Urology) Lloyd Huger, MD as Consulting Physician (Oncology) Lucky Cowboy Erskine Squibb, MD as Referring Physician (Vascular Surgery)  Extended Emergency Contact Information Primary Emergency Contact: Hernandez Phone: [phone] Mobile Phone: [phone] Relation: Daughter Secondary Emergency Contact: warner,[name] Mobile Phone: [phone] Relation: Daughter Interpreter needed? No  Code Status:  DNR Goals of care: Advanced Directive information    02/17/2023   11:35 AM  Advanced Directives  Does Patient Have a Medical Advance Directive? Yes  Type of Paramedic of [place];Living will;Out of facility DNR (pink MOST or yellow form)  Does patient want to make changes to medical advance directive? No - Patient declined  Copy of [place] in Chart? Yes - validated most recent copy scanned in chart (See row information)     Chief Complaint  Patient presents with   Medical Management of Chronic Issues    Medical Management of Chronic Issues.     HPI:  Pt is a 82 y.o. male seen today for medical management of chronic diseases.   Pt is now at [facility] long term.  He is doing well. He has hx of htn, CKD, hyperlipidemia, vit d def, and falls.  No recent falls but when at home had fracture of his L2 vertabra on 11/24/22. He denies any pain at this time and mobility has improved.  Has not complaints or concerns today.   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 chin. Nodular and infiltrative patterns.EDC.   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 Nodular. EDC   Basal cell  carcinoma 08/26/2020   Right mid to inf. ear helix. Nodular, ulcerated. 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 Ulcerated.   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   L upper eyelid, exc and ED, pt had BCC of L upper eyelid and excision also included this SCC   Past Surgical History:  Procedure Laterality Date   BACK SURGERY     CATARACT EXTRACTION     HERNIA REPAIR     PORTA CATH INSERTION N/A 01/01/2019   Procedure: PORTA CATH INSERTION;  Surgeon: Algernon Huxley, MD;  Location: [facility];  Service: Cardiovascular;  Laterality: N/A;   TONSILLECTOMY     TRANSURETHRAL RESECTION OF BLADDER TUMOR N/A 12/08/2018   Procedure: TRANSURETHRAL RESECTION OF BLADDER TUMOR (TURBT);  Surgeon: Billey Co, MD;  Location: [facility] ORS;  Service: Urology;  Laterality: N/A;    No Known Allergies  Outpatient Encounter Medications as of 02/17/2023  Medication Sig   atorvastatin (LIPITOR) 20 MG tablet Take 20 mg by mouth daily.    lidocaine (LIDODERM) 5 % Place 1 patch onto the skin daily. Remove & Discard patch within 12 hours or as  directed by MD   magnesium chloride (SLOW-MAG) 64 MG TBEC SR tablet Take 1 tablet by mouth at bedtime.   metoprolol succinate (TOPROL-XL) 25 MG 24 hr tablet Take 25 mg by mouth daily.   Multiple Vitamins-Minerals (CENTRUM MEN) TABS Take 1 tablet by mouth daily.   Vitamin D, Ergocalciferol, (DRISDOL) 1.25 MG (50000 UT) CAPS capsule Take 50,000 Units by mouth every 7 (seven) days.   [DISCONTINUED] hydrocortisone cream 1 % Apply to Pelvic area topically two times daily   No  facility-administered encounter medications on file as of 02/17/2023.    Review of Systems  Constitutional:  Negative for activity change, appetite change, fatigue and unexpected weight change.  HENT:  Negative for congestion and hearing loss.   Eyes: Negative.   Respiratory:  Negative for cough and shortness of breath.   Cardiovascular:  Negative for chest pain, palpitations and leg swelling.  Gastrointestinal:  Negative for abdominal pain, constipation and diarrhea.  Genitourinary:  Negative for difficulty urinating and dysuria.  Musculoskeletal:  Negative for arthralgias and myalgias.  Skin:  Negative for color change and wound.  Neurological:  Negative for dizziness and weakness.  Psychiatric/Behavioral:  Negative for agitation, behavioral problems and confusion.     Immunization History  Administered Date(s) Administered   Influenza Inj Mdck Quad Pf 08/25/2021, 08/31/2022   Influenza-Unspecified 08/27/2014, 09/03/2015, 08/31/2016, 08/29/2017   Moderna Sars-Covid-2 Vaccination 12/11/2019, 01/08/2020, 10/08/2020, 08/20/2021   Pneumococcal Conjugate-13 03/21/2014, 06/02/2017   Pneumococcal Polysaccharide-23 04/08/2016   Pertinent  Health Maintenance Due  Topic Date Due   INFLUENZA VACCINE  Completed      05/26/2019    4:21 PM 11/23/2022   10:33 PM 11/24/2022   10:00 PM 11/25/2022    8:30 PM 11/26/2022    9:00 AM  Fall Risk  (RETIRED) Patient Fall Risk Level High fall risk High fall risk High fall risk High fall risk High fall risk   Functional Status Survey:    Vitals:   02/17/23 1125  BP: 116/60  Pulse: 73  Resp: 20  Temp: 97.8 F (36.6 C)  SpO2: 98%  Weight: 157 lb (71.2 kg)  Height: 5\' 8"  (1.727 m)   Body mass index is 23.87 kg/m. Physical Exam Constitutional:      General: He is not in acute distress.    Appearance: He is well-developed. He is not diaphoretic.  HENT:     Head: Normocephalic and atraumatic.     Right Ear: External ear normal.     Left  Ear: External ear normal.     Mouth/Throat:     Pharynx: No oropharyngeal exudate.  Eyes:     Conjunctiva/sclera: Conjunctivae normal.     Pupils: Pupils are equal, round, and reactive to light.  Cardiovascular:     Rate and Rhythm: Normal rate and regular rhythm.     Heart sounds: Normal heart sounds.  Pulmonary:     Effort: Pulmonary effort is normal.     Breath sounds: Normal breath sounds.  Abdominal:     General: Bowel sounds are normal.     Palpations: Abdomen is soft.  Musculoskeletal:        General: No tenderness.     Cervical back: Normal range of motion and neck supple.     Right lower leg: No edema.     Left lower leg: No edema.  Skin:    General: Skin is warm and dry.  Neurological:     Mental Status: He is alert and oriented to person, place,  and time.     Labs reviewed: Recent Labs    11/23/22 2249 11/25/22 0619 01/27/23 0000  NA 138 140 137  K 4.4 3.9 4.7  CL 105 109 103  CO2 22 24 30*  GLUCOSE 90 112*  --   BUN 25* 21 30*  CREATININE 1.21 1.04 1.2  CALCIUM 9.8 8.5* 8.9   Recent Labs    01/27/23 0000  AST 12*  ALT 11  ALKPHOS 91  ALBUMIN 3.3*   Recent Labs    11/23/22 2249 11/25/22 0619 01/27/23 0000  WBC 12.6* 13.9* 7.8  HGB 13.7 11.7* 12.1*  HCT 43.0 35.5* 36*  MCV 95.6 94.4  --   PLT 482* 433* 419*   No results found for: "TSH" No results found for: "HGBA1C" No results found for: "CHOL", "HDL", "LDLCALC", "LDLDIRECT", "TRIG", "CHOLHDL"  Significant Diagnostic Results in last 30 days:  No results found.  Assessment/Plan 1. Chronic kidney disease, stage 3a (HCC) -Chronic and stable Encourage proper hydration Follow metabolic panel Avoid nephrotoxic meds (NSAIDS)  2. Essential hypertension -Blood pressure well controlled, goal bp <140/90 Continue current medications and dietary modifications follow metabolic panel  3. Hypercholesterolemia -follow up lipids, continues with dietary modifications  4. Recurrent falls No  recent falls, continue fall precautions.   5. Compression fracture of L2 vertebra with routine healing, subsequent encounter Healing well. No issues at this time.   Carlos American. [place], [facility] [phone]

## 2023-02-23 ENCOUNTER — Ambulatory Visit (INDEPENDENT_AMBULATORY_CARE_PROVIDER_SITE_OTHER): Payer: Medicare Other | Admitting: Dermatology

## 2023-02-23 VITALS — BP 119/87

## 2023-02-23 DIAGNOSIS — L578 Other skin changes due to chronic exposure to nonionizing radiation: Secondary | ICD-10-CM | POA: Diagnosis not present

## 2023-02-23 DIAGNOSIS — L820 Inflamed seborrheic keratosis: Secondary | ICD-10-CM | POA: Diagnosis not present

## 2023-02-23 DIAGNOSIS — C44319 Basal cell carcinoma of skin of other parts of face: Secondary | ICD-10-CM

## 2023-02-23 DIAGNOSIS — L821 Other seborrheic keratosis: Secondary | ICD-10-CM | POA: Diagnosis not present

## 2023-02-23 DIAGNOSIS — D485 Neoplasm of uncertain behavior of skin: Secondary | ICD-10-CM

## 2023-02-23 NOTE — Patient Instructions (Signed)
Cryotherapy Aftercare  Wash gently with soap and water everyday.   Apply Vaseline and Band-Aid daily until healed.   Shave Excision Benign Lesion Wound Care Instructions  Leave the original bandage on for 24 hours if possible.  If the bandage becomes soaked or soiled before that time, it is OK to remove it and examine the wound.  A small amount of post-operative bleeding is normal.  If excessive bleeding occurs, remove the bandage, place gauze over the site and apply continuous pressure (no peeking) over the area for 20-30 minutes.  If this does not stop the bleeding, try again for 40 minutes.  If this does not work, please call our clinic as soon as possible (even if after-hours).    Twice a day, cleanse the wound with soap and water.  If a thick crust develops you may use a Q-tip dipped into dilute hydrogen peroxide (mix 1:1 with water) to dissolve it.  Hydrogen peroxide can slow the healing process, so use it only as needed.  After washing, apply Vaseline jelly or Polysporin ointment.  For best healing, the wound should be covered with a layer of ointment at all times.  This may mean re-applying the ointment several times a day.  For open wounds, continue until it has healed.    If you have any swelling, keep the area elevated.  Some redness, tenderness and white or yellow material in the wound is normal healing.  If the area becomes very sore and red, or develops a thick yellow-green material (pus), it may be infected; please notify us.    Wound healing continues for up to one year following surgery.  It is not unusual to experience pain in the scar from time to time during the interval.  If the pain becomes severe or the scar thickens, you should notify the office.  A slight amount of redness in a scar is expected for the first six months.  After six months, the redness subsides and the scar will soften and fade.  The color difference becomes less noticeable with time.  If there are any problems,  return for a post-op surgery check at your earliest convenience.  Please call our office for any questions or concerns.    Due to recent changes in healthcare laws, you may see results of your pathology and/or laboratory studies on MyChart before the doctors have had a chance to review them. We understand that in some cases there may be results that are confusing or concerning to you. Please understand that not all results are received at the same time and often the doctors may need to interpret multiple results in order to provide you with the best plan of care or course of treatment. Therefore, we ask that you please give us 2 business days to thoroughly review all your results before contacting the office for clarification. Should we see a critical lab result, you will be contacted sooner.   If You Need Anything After Your Visit  If you have any questions or concerns for your doctor, please call our main line at [phone] and press option 4 to reach your doctor's medical assistant. If no one answers, please leave a voicemail as directed and we will return your call as soon as possible. Messages left after 4 pm will be answered the following business day.   You may also send us a message via MyChart. We typically respond to MyChart messages within 1-2 business days.  For prescription refills, please ask your   pharmacy to contact our office. Our fax number is [phone].  If you have an urgent issue when the clinic is closed that cannot wait until the next business day, you can page your doctor at the number below.    Please note that while we do our best to be available for urgent issues outside of office hours, we are not available 24/7.   If you have an urgent issue and are unable to reach us, you may choose to seek medical care at your doctor's office, retail clinic, urgent care center, or emergency room.  If you have a medical emergency, please immediately call 911 or go to the  emergency department.  Pager Numbers  - Dr. Kowalski: [phone]  - Dr. Moye: [phone]  - Dr. Stewart: [phone]  In the event of inclement weather, please call our main line at [phone] for an update on the status of any delays or closures.  Dermatology Medication Tips: Please keep the boxes that topical medications come in in order to help keep track of the instructions about where and how to use these. Pharmacies typically print the medication instructions only on the boxes and not directly on the medication tubes.   If your medication is too expensive, please contact our office at [phone] option 4 or send us a message through MyChart.   We are unable to tell what your co-pay for medications will be in advance as this is different depending on your insurance coverage. However, we may be able to find a substitute medication at lower cost or fill out paperwork to get insurance to cover a needed medication.   If a prior authorization is required to get your medication covered by your insurance company, please allow us 1-2 business days to complete this process.  Drug prices often vary depending on where the prescription is filled and some pharmacies may offer cheaper prices.  The website www.goodrx.com contains coupons for medications through different pharmacies. The prices here do not account for what the cost may be with help from insurance (it may be cheaper with your insurance), but the website can give you the price if you did not use any insurance.  - You can print the associated coupon and take it with your prescription to the pharmacy.  - You may also stop by our office during regular business hours and pick up a GoodRx coupon card.  - If you need your prescription sent electronically to a different pharmacy, notify our office through [facility] MyChart or by phone at [phone] option 4.     Si Usted Necesita Algo Despus de Su Visita  Tambin puede  enviarnos un mensaje a travs de MyChart. Por lo general respondemos a los mensajes de MyChart en el transcurso de 1 a 2 das hbiles.  Para renovar recetas, por favor pida a su farmacia que se ponga en contacto con nuestra oficina. Nuestro nmero de fax es el [phone].  Si tiene un asunto urgente cuando la clnica est cerrada y que no puede esperar hasta el siguiente da hbil, puede llamar/localizar a su doctor(a) al nmero que aparece a continuacin.   Por favor, tenga en cuenta que aunque hacemos todo lo posible para estar disponibles para asuntos urgentes fuera del horario de oficina, no estamos disponibles las 24 horas del da, los 7 das de la semana.   Si tiene un problema urgente y no puede comunicarse con nosotros, puede optar por buscar atencin mdica  en el consultorio de su doctor(a), en   una clnica privada, en un centro de atencin urgente o en una sala de emergencias.  Si tiene una emergencia mdica, por favor llame inmediatamente al 911 o vaya a la sala de emergencias.  Nmeros de bper  - Dr. Kowalski: [phone]  - Dra. Moye: [phone]  - Dra. Stewart: [phone]  En caso de inclemencias del tiempo, por favor llame a nuestra lnea principal al [phone] para una actualizacin sobre el estado de cualquier retraso o cierre.  Consejos para la medicacin en dermatologa: Por favor, guarde las cajas en las que vienen los medicamentos de uso tpico para ayudarle a seguir las instrucciones sobre dnde y cmo usarlos. Las farmacias generalmente imprimen las instrucciones del medicamento slo en las cajas y no directamente en los tubos del medicamento.   Si su medicamento es muy caro, por favor, pngase en contacto con nuestra oficina llamando al [phone] y presione la opcin 4 o envenos un mensaje a travs de MyChart.   No podemos decirle cul ser su copago por los medicamentos por adelantado ya que esto es diferente dependiendo de la cobertura de su seguro.  Sin embargo, es posible que podamos encontrar un medicamento sustituto a menor costo o llenar un formulario para que el seguro cubra el medicamento que se considera necesario.   Si se requiere una autorizacin previa para que su compaa de seguros cubra su medicamento, por favor permtanos de 1 a 2 das hbiles para completar este proceso.  Los precios de los medicamentos varan con frecuencia dependiendo del lugar de dnde se surte la receta y alguna farmacias pueden ofrecer precios ms baratos.  El sitio web www.goodrx.com tiene cupones para medicamentos de diferentes farmacias. Los precios aqu no tienen en cuenta lo que podra costar con la ayuda del seguro (puede ser ms barato con su seguro), pero el sitio web puede darle el precio si no utiliz ningn seguro.  - Puede imprimir el cupn correspondiente y llevarlo con su receta a la farmacia.  - Tambin puede pasar por nuestra oficina durante el horario de atencin regular y recoger una tarjeta de cupones de GoodRx.  - Si necesita que su receta se enve electrnicamente a una farmacia diferente, informe a nuestra oficina a travs de MyChart de [place] o por telfono llamando al [phone] y presione la opcin 4.  

## 2023-02-23 NOTE — Progress Notes (Signed)
   Follow-Up Visit   Subjective  Philip [name] is a 82 y.o. male who presents for the following: ISK follow up of scalp, forehead, left ear treated with LN2 The patient has spots, moles and lesions to be evaluated, some may be new or changing and the patient has concerns that these could be cancer. Accompanied by daughter  The following portions of the chart were reviewed this encounter and updated as appropriate: medications, allergies, medical history  Review of Systems:  No other skin or systemic complaints except as noted in HPI or Assessment and Plan.  Objective  Well appearing patient in no apparent distress; mood and affect are within normal limits.  A focused examination was performed of the following areas: Scalp, face, ears  Relevant exam findings are noted in the Assessment and Plan.  Left post ear at mid to inf helix Crusted papule 1.5 cm   Assessment & Plan   SEBORRHEIC KERATOSIS - Stuck-on, waxy, tan-brown papules and/or plaques  - Benign-appearing - Discussed benign etiology and prognosis. - Observe - Call for any changes  ACTINIC DAMAGE - chronic, secondary to cumulative UV radiation exposure/sun exposure over time - diffuse scaly erythematous macules with underlying dyspigmentation - Recommend daily broad spectrum sunscreen SPF 30+ to sun-exposed areas, reapply every 2 hours as needed.  - Recommend staying in the shade or wearing long sleeves, sun glasses (UVA+UVB protection) and wide brim hats (4-inch brim around the entire circumference of the hat). - Call for new or changing lesions.  INFLAMED SEBORRHEIC KERATOSIS Exam: Erythematous keratotic or waxy stuck-on papule or plaque.  Symptomatic, irritating, patient would like treated.  Benign-appearing.  Call clinic for new or changing lesions.   Prior to procedure, discussed risks of blister formation, small wound, skin dyspigmentation, or rare scar following treatment. Recommend Vaseline ointment to  treated areas while healing.  Destruction Procedure Note Destruction method: cryotherapy   Informed consent: discussed and consent obtained   Lesion destroyed using liquid nitrogen: Yes   Outcome: patient tolerated procedure well with no complications   Post-procedure details: wound care instructions given   Locations: Left scalp # of Lesions Treated: 1   Neoplasm of uncertain behavior of skin Left post ear at mid to inf helix  Skin / nail biopsy Type of biopsy: tangential   Informed consent: discussed and consent obtained   Timeout: patient name, date of birth, surgical site, and procedure verified   Procedure prep:  Patient was prepped and draped in usual sterile fashion Prep type:  Isopropyl alcohol Anesthesia: the lesion was anesthetized in a standard fashion   Anesthetic:  1% lidocaine w/ epinephrine 1-100,000 buffered w/ 8.4% NaHCO3 Instrument used: flexible razor blade   Hemostasis achieved with: pressure, aluminum chloride and electrodesiccation   Outcome: patient tolerated procedure well   Post-procedure details: sterile dressing applied and wound care instructions given   Dressing type: bandage and petrolatum    Specimen 1 - Surgical pathology Differential Diagnosis: BCC vs other  Check Margins: No  Will plan surgery if +BCC   Return in about 6 months (around 08/26/2023).  I, Ashok Cordia, CMA, am acting as scribe for Sarina Ser, MD .  Documentation: I have reviewed the above documentation for accuracy and completeness, and I agree with the above.  Sarina Ser, MD

## 2023-02-25 ENCOUNTER — Encounter: Payer: Self-pay | Admitting: Dermatology

## 2023-03-02 ENCOUNTER — Telehealth: Payer: Self-pay

## 2023-03-02 NOTE — Telephone Encounter (Signed)
-----   Message from Ralene Bathe, MD sent at 03/02/2023  1:43 PM EDT ----- Diagnosis Skin , left post ear at mid to inf helix BASAL CELL CARCINOMA, NODULAR PATTERN  Cancer - BCC Schedule surgery

## 2023-03-02 NOTE — Telephone Encounter (Signed)
Patient's daughter informed of pathology results and surgery scheduled.

## 2023-03-08 ENCOUNTER — Non-Acute Institutional Stay (INDEPENDENT_AMBULATORY_CARE_PROVIDER_SITE_OTHER): Payer: Medicare Other | Admitting: Nurse Practitioner

## 2023-03-08 ENCOUNTER — Encounter: Payer: Self-pay | Admitting: Nurse Practitioner

## 2023-03-08 DIAGNOSIS — Z0000 Encounter for general adult medical examination without abnormal findings: Secondary | ICD-10-CM | POA: Diagnosis not present

## 2023-03-08 NOTE — Progress Notes (Signed)
Subjective:   Philip [name] is a 82 y.o. male who presents for Medicare Annual/Subsequent preventive examination.  Review of Systems     Cardiac Risk Factors include: advanced age (>20men, >78 women);male gender;dyslipidemia;sedentary lifestyle     Objective:    Today's Vitals   03/08/23 1218  BP: 118/70  Pulse: 73  Temp: 97.7 F (36.5 C)  SpO2: 97%  Weight: 157 lb (71.2 kg)  Height: 5\' 8"  (1.727 m)  PainSc: 0-No pain   Body mass index is 23.87 kg/m. Wt Readings from Last 3 Encounters:  03/08/23 157 lb (71.2 kg)  02/17/23 157 lb (71.2 kg)  01/26/23 151 lb 12.8 oz (68.9 kg)        03/08/2023   12:20 PM 02/17/2023   11:35 AM 01/26/2023    9:05 AM 12/01/2022    9:08 AM 11/23/2022   10:32 PM 05/26/2019    3:47 PM 05/26/2019   10:07 AM  Advanced Directives  Does Patient Have a Medical Advance Directive? Yes Yes Yes Yes Yes Yes No  Type of Estate agent of [place];Living will;Out of facility DNR (pink MOST or yellow form) Healthcare Power of [place];Living will;Out of facility DNR (pink MOST or yellow form) Healthcare Power of [place];Living will;Out of facility DNR (pink MOST or yellow form) Living will;Healthcare Power of [place];Out of facility DNR (pink MOST or yellow form) Healthcare Power of [place];Out of facility DNR (pink MOST or yellow form);Living will Healthcare Power of [place];Living will   Does patient want to make changes to medical advance directive? No - Patient declined No - Patient declined No - Patient declined No - Patient declined  No - Patient declined   Copy of Healthcare Power of Attorney in Chart? Yes - validated most recent copy scanned in chart (See row information) Yes - validated most recent copy scanned in chart (See row information) Yes - validated most recent copy scanned in chart (See row information) No - copy requested  No - copy requested   Would patient like information on creating a medical advance directive?      No  - Patient declined No - Patient declined    Current Medications (verified) Outpatient Encounter Medications as of 03/08/2023  Medication Sig   atorvastatin (LIPITOR) 20 MG tablet Take 20 mg by mouth daily.    lidocaine (LIDODERM) 5 % Place 1 patch onto the skin daily. Remove & Discard patch within 12 hours or as directed by MD   magnesium chloride (SLOW-MAG) 64 MG TBEC SR tablet Take 1 tablet by mouth at bedtime.   metoprolol succinate (TOPROL-XL) 25 MG 24 hr tablet Take 12.5 mg by mouth daily.   Multiple Vitamins-Minerals (CENTRUM MEN) TABS Take 1 tablet by mouth daily.   Vitamin D, Ergocalciferol, (DRISDOL) 1.25 MG (50000 UT) CAPS capsule Take 50,000 Units by mouth every 7 (seven) days.   No facility-administered encounter medications on file as of 03/08/2023.    Allergies (verified) Patient has no known allergies.   History: Past Medical History:  Diagnosis Date   Basal cell carcinoma 10/02/2018   Left lat. chin. Nodular and infiltrative patterns.EDC.   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 Nodular. EDC   Basal cell carcinoma 08/26/2020   Right mid to inf. ear helix. Nodular, ulcerated. 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 Ulcerated.   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   L upper eyelid, exc and ED, pt had BCC of L upper eyelid and excision also included this SCC   Past Surgical History:  Procedure Laterality Date   BACK SURGERY     CATARACT EXTRACTION     HERNIA REPAIR      PORTA CATH INSERTION N/A 01/01/2019   Procedure: PORTA CATH INSERTION;  Surgeon: Annice Needyew, Jason S, MD;  Location: [facility] INVASIVE CV LAB;  Service: Cardiovascular;  Laterality: N/A;   TONSILLECTOMY     TRANSURETHRAL RESECTION OF BLADDER TUMOR N/A 12/08/2018   Procedure: TRANSURETHRAL RESECTION OF BLADDER TUMOR (TURBT);  Surgeon: Sondra ComeSninsky, Brian C, MD;  Location: [facility] ORS;  Service: Urology;  Laterality: N/A;   Family History  Problem Relation Age of Onset   Heart failure Mother    Social History   Socioeconomic History   Marital status: Widowed    Spouse name: Not on file   Number of children: 4   Years of education: Not on file   Highest education level: Not on file  Occupational History   Not on file  Tobacco Use   Smoking status: Former    Years: 35    Types: Cigarettes    Quit date: 11/29/1993    Years since quitting: 29.2   Smokeless tobacco: Never  Vaping Use   Vaping Use: Never used  Substance and Sexual Activity   Alcohol use: Yes    Comment: a couple of drinks a day all the above   Drug use: Never   Sexual activity: Not Currently    Partners: Female  Other Topics Concern   Not on file  Social History Narrative   Not on file   Social Determinants of Health   Financial Resource Strain: Not on file  Food Insecurity: Not on file  Transportation Needs: Not on file  Physical Activity: Not on file  Stress: Not on file  Social Connections: Not on file    Tobacco Counseling Counseling given: Not Answered   Clinical Intake:  Pre-visit preparation completed: Yes  Pain : No/denies pain Pain Score: 0-No pain     BMI - recorded: 23 Nutritional Status: BMI of 19-24  Normal Diabetes: No  How often do you need to have someone help you when you read instructions, pamphlets, or other written materials from your doctor or pharmacy?: 1 - Never  Diabetic?no         Activities of Daily Living    03/08/2023   12:41 PM  In your present state of health, do you have  any difficulty performing the following activities:  Hearing? 0  Vision? 0  Difficulty concentrating or making decisions? 0  Walking or climbing stairs? 0  Dressing or bathing? 0  Doing errands, shopping? 1  Comment daughter helps  Preparing Food and eating ? N  Using the Toilet? N  In the past six months, have you accidently leaked urine? N  Comment ostomy bag  Do you have problems with loss of bowel control? N  Managing your Medications? N  Managing your Finances? N  Housekeeping or managing your  Housekeeping? N    Patient Care Team: Earnestine Mealing, MD as PCP - General (Family Medicine) Sondra Come, MD as Consulting Physician (Urology) Jeralyn Ruths, MD as Consulting Physician (Oncology) Wyn Quaker Marlow Baars, MD as Referring Physician (Vascular Surgery)  Indicate any recent Medical Services you may have received from other than [facility] providers in the past year (date may be approximate).     Assessment:   This is a routine wellness examination for Shafiq.  Hearing/Vision screen No results found.  Dietary issues and exercise activities discussed: Current Exercise Habits: Home exercise routine;Structured exercise class, Time (Minutes): 45, Frequency (Times/Week): 2, Weekly Exercise (Minutes/Week): 90   Goals Addressed   None    Depression Screen     No data to display          Fall Risk    02/01/2019    4:00 PM  Fall Risk   Falls in the past year? 0    FALL RISK PREVENTION PERTAINING TO THE HOME:  Any stairs in or around the home? No  If so, are there any without handrails?  na Home free of loose throw rugs in walkways, pet beds, electrical cords, etc? Yes  Adequate lighting in your home to reduce risk of falls? Yes   ASSISTIVE DEVICES UTILIZED TO PREVENT FALLS:  Life alert? No  Use of a cane, walker or w/c? Yes  Grab bars in the bathroom? Yes  Shower chair or bench in shower? Yes  Elevated toilet seat or a handicapped toilet? Yes   TIMED UP AND  GO:  Was the test performed? No .    Cognitive Function:        Immunizations Immunization History  Administered Date(s) Administered   Covid-19, Mrna,Vaccine(Spikevax)61yrs and older 03/08/2023   Influenza Inj Mdck Quad Pf 08/25/2021, 08/31/2022   Influenza-Unspecified 08/27/2014, 09/03/2015, 08/31/2016, 08/29/2017   Moderna Sars-Covid-2 Vaccination 12/11/2019, 01/08/2020, 10/08/2020, 08/20/2021   Pneumococcal Conjugate-13 03/21/2014, 06/02/2017   Pneumococcal Polysaccharide-23 04/08/2016   Tdap 12/05/2017    TDAP status: Due, Education has been provided regarding the importance of this vaccine. Advised may receive this vaccine at local pharmacy or Health Dept. Aware to provide a copy of the vaccination record if obtained from local pharmacy or Health Dept. Verbalized acceptance and understanding.  Flu Vaccine status: Up to date  Pneumococcal vaccine status: Up to date  Covid-19 vaccine status: Completed vaccines  Qualifies for Shingles Vaccine? Yes   Zostavax completed No   Shingrix Completed?: Yes  Screening Tests Health Maintenance  Topic Date Due   Zoster Vaccines- Shingrix (1 of 2) Never done   COVID-19 Vaccine (6 - 2023-24 season) 05/03/2023   INFLUENZA VACCINE  06/30/2023   Medicare Annual Wellness (AWV)  03/07/2024   DTaP/Tdap/Td (2 - Td or Tdap) 12/06/2027   Pneumonia Vaccine 42+ Years old  Completed   HPV VACCINES  Aged Out    Health Maintenance  Health Maintenance Due  Topic Date Due   Zoster Vaccines- Shingrix (1 of 2) Never done    Colorectal cancer screening: No longer required.   Lung Cancer Screening: (Low Dose CT Chest recommended if Age 84-80 years, 30 pack-year currently smoking OR have quit w/in 15years.) does not qualify.   Lung Cancer Screening Referral: na  Additional Screening:  Hepatitis C Screening: does not qualify; Completed na  Vision Screening: Recommended annual ophthalmology exams for early detection of glaucoma and other  disorders of the eye. Is the patient up to date with their annual eye exam?  No  Who is the provider or what is the name of the office in which the patient attends annual eye exams? Does not have eye MD.  If pt is not established with a provider, would they like to be referred to a provider to establish care? No .   Dental Screening: Recommended annual dental exams for proper oral hygiene  Community Resource Referral / Chronic Care Management: CRR required this visit?  No   CCM required this visit?  No      Plan:     I have personally reviewed and noted the following in the patient's chart:   Medical and social history Use of alcohol, tobacco or illicit drugs  Current medications and supplements including opioid prescriptions. Patient is not currently taking opioid prescriptions. Functional ability and status Nutritional status Physical activity Advanced directives List of other physicians Hospitalizations, surgeries, and ER visits in previous 12 months Vitals Screenings to include cognitive, depression, and falls Referrals and appointments  In addition, I have reviewed and discussed with patient certain preventive protocols, quality metrics, and best practice recommendations. A written personalized care plan for preventive services as well as general preventive health recommendations were provided to patient.     Sharon Seller, NP   03/08/2023   Place of service: [facility]

## 2023-03-10 LAB — LIPID PANEL
Cholesterol: 140 (ref 0–200)
HDL: 43 (ref 35–70)
LDL Cholesterol: 82
Triglycerides: 71 (ref 40–160)

## 2023-03-29 ENCOUNTER — Ambulatory Visit (INDEPENDENT_AMBULATORY_CARE_PROVIDER_SITE_OTHER): Payer: Medicare Other | Admitting: Dermatology

## 2023-03-29 ENCOUNTER — Encounter: Payer: Self-pay | Admitting: Dermatology

## 2023-03-29 VITALS — BP 120/76

## 2023-03-29 DIAGNOSIS — C44219 Basal cell carcinoma of skin of left ear and external auricular canal: Secondary | ICD-10-CM

## 2023-03-29 MED ORDER — MUPIROCIN 2 % EX OINT
1.0000 | TOPICAL_OINTMENT | Freq: Every day | CUTANEOUS | 0 refills | Status: DC
Start: 2023-03-29 — End: 2023-04-27

## 2023-03-29 NOTE — Progress Notes (Signed)
   Follow-Up Visit   Subjective  Philip [name] is a 82 y.o. male who presents for the following: BCC bx proven (L post ear at mid to inf helix, pt presents for excision today).  The following portions of the chart were reviewed this encounter and updated as appropriate:   Tobacco  Allergies  Meds  Problems  Med Hx  Surg Hx  Fam Hx     Review of Systems:  No other skin or systemic complaints except as noted in HPI or Assessment and Plan.  Objective  Well appearing patient in no apparent distress; mood and affect are within normal limits.  A focused examination was performed including left ear. Relevant physical exam findings are noted in the Assessment and Plan.  Left post ear at mid to inf helix Healing biopsy site 2.2    Assessment & Plan  Basal cell carcinoma (BCC) of skin of left ear Left post ear at mid to inf helix  Skin excision  Lesion length (cm):  2.2 Lesion width (cm):  2.2 Margin per side (cm):  0.1 Total excision diameter (cm):  2.4 Informed consent: discussed and consent obtained   Timeout: patient name, date of birth, surgical site, and procedure verified   Procedure prep:  Patient was prepped and draped in usual sterile fashion Prep type:  Isopropyl alcohol and povidone-iodine Anesthesia: the lesion was anesthetized in a standard fashion   Anesthetic:  1% lidocaine w/ epinephrine 1-100,000 buffered w/ 8.4% NaHCO3 (3cc lido w/ epi) Instrument used comment:  #15c blade Hemostasis achieved with: pressure   Hemostasis achieved with comment:  Electrocautery Outcome: patient tolerated procedure well with no complications   Post-procedure details: sterile dressing applied and wound care instructions given   Dressing type: bandage, pressure dressing and bacitracin (mupirocin)   Additional details:  Secondary intention healing  mupirocin ointment (BACTROBAN) 2 % Apply 1 Application topically daily. Qd to excision site  Specimen 1 - Surgical  pathology Differential Diagnosis: Biopsy proven BCC Check Margins: Yes [phone] Tagged with ink at 12:00 superior  Bx proven, excised today Start Mupirocin oint qd to excision site   Return in about 1 week (around 04/05/2023) for suture removal.  I, Ardis Rowan, RMA, am acting as scribe for Armida Sans, MD . Documentation: I have reviewed the above documentation for accuracy and completeness, and I agree with the above.  Armida Sans, MD

## 2023-03-29 NOTE — Patient Instructions (Signed)

## 2023-03-30 ENCOUNTER — Telehealth: Payer: Self-pay

## 2023-03-30 NOTE — Telephone Encounter (Signed)
Herbert Seta, RN from [facility] called and states patient is doing okay. She has changed his bandage with little to no bleeding. Patient has no pain or discomfort. aw

## 2023-03-30 NOTE — Telephone Encounter (Signed)
Spoke to [place] at [facility] to see how pt was doing after yesterdays's surgery.  She will have Mr. Belue nurse call with update./sh

## 2023-04-03 ENCOUNTER — Encounter: Payer: Self-pay | Admitting: Dermatology

## 2023-04-05 ENCOUNTER — Ambulatory Visit (INDEPENDENT_AMBULATORY_CARE_PROVIDER_SITE_OTHER): Payer: Medicare Other | Admitting: Dermatology

## 2023-04-05 VITALS — BP 162/79 | HR 69

## 2023-04-05 DIAGNOSIS — L570 Actinic keratosis: Secondary | ICD-10-CM | POA: Diagnosis not present

## 2023-04-05 DIAGNOSIS — Z85828 Personal history of other malignant neoplasm of skin: Secondary | ICD-10-CM

## 2023-04-05 DIAGNOSIS — L820 Inflamed seborrheic keratosis: Secondary | ICD-10-CM

## 2023-04-05 NOTE — Progress Notes (Unsigned)
   Follow-Up Visit   Subjective  Philip [name] is a 83 y.o. male who presents for the following: Suture removal The patient has spots, moles and lesions to be evaluated, some may be new or changing and the patient may have concern these could be cancer.   The following portions of the chart were reviewed this encounter and updated as appropriate: medications, allergies, medical history  Review of Systems:  No other skin or systemic complaints except as noted in HPI or Assessment and Plan.  Objective  Well appearing patient in no apparent distress; mood and affect are within normal limits. Areas Examined: Scalp and left ear  Relevant physical exam findings are noted in the Assessment and Plan.  scalp x 4 (4) Erythematous thin papules/macules with gritty scale.   scalp x 2 Stuck-on, waxy, tan-brown papules --Discussed benign etiology and prognosis.    Assessment & Plan   AK (actinic keratosis) (4) scalp x 4  Actinic keratoses are precancerous spots that appear secondary to cumulative UV radiation exposure/sun exposure over time. They are chronic with expected duration over 1 year. A portion of actinic keratoses will progress to squamous cell carcinoma of the skin. It is not possible to reliably predict which spots will progress to skin cancer and so treatment is recommended to prevent development of skin cancer.  Recommend daily broad spectrum sunscreen SPF 30+ to sun-exposed areas, reapply every 2 hours as needed.  Recommend staying in the shade or wearing long sleeves, sun glasses (UVA+UVB protection) and wide brim hats (4-inch brim around the entire circumference of the hat). Call for new or changing lesions.   Destruction of lesion - scalp x 4 Complexity: simple   Destruction method: cryotherapy   Informed consent: discussed and consent obtained   Timeout:  patient name, date of birth, surgical site, and procedure verified Lesion destroyed using liquid nitrogen: Yes    Region frozen until ice ball extended beyond lesion: Yes   Outcome: patient tolerated procedure well with no complications   Post-procedure details: wound care instructions given    Inflamed seborrheic keratosis scalp x 2  Symptomatic, irritating, patient would like treated.   Destruction of lesion - scalp x 2 Complexity: simple   Destruction method: cryotherapy   Informed consent: discussed and consent obtained   Timeout:  patient name, date of birth, surgical site, and procedure verified Lesion destroyed using liquid nitrogen: Yes   Region frozen until ice ball extended beyond lesion: Yes   Outcome: patient tolerated procedure well with no complications   Post-procedure details: wound care instructions given     Encounter for wound check left ear status post BCC excisions margins clear - Incision site is clean, dry and intact. - Wound cleansed, sutures removed, wound cleansed and steri strips applied.  - Patient advised to keep steri-strips dry until they fall off. - Scars remodel for a full year. - Once steri-strips fall off, patient can apply over-the-counter silicone scar cream once to twice a day to help with scar remodeling if desired. - Patient advised to call with any concerns or if they notice any new or changing lesions.  We will call patient with biopsy results   Return in about 1 month (around 05/06/2023) for AKs, BCC .  IAngelique Holm, CMA, am acting as scribe for Armida Sans, MD .   Documentation: I have reviewed the above documentation for accuracy and completeness, and I agree with the above.  Armida Sans, MD

## 2023-04-05 NOTE — Patient Instructions (Addendum)
Cryotherapy Aftercare  Wash gently with soap and water everyday.   Apply Vaseline and Band-Aid daily until healed.     Due to recent changes in healthcare laws, you may see results of your pathology and/or laboratory studies on MyChart before the doctors have had a chance to review them. We understand that in some cases there may be results that are confusing or concerning to you. Please understand that not all results are received at the same time and often the doctors may need to interpret multiple results in order to provide you with the best plan of care or course of treatment. Therefore, we ask that you please give us 2 business days to thoroughly review all your results before contacting the office for clarification. Should we see a critical lab result, you will be contacted sooner.   If You Need Anything After Your Visit  If you have any questions or concerns for your doctor, please call our main line at [phone] and press option 4 to reach your doctor's medical assistant. If no one answers, please leave a voicemail as directed and we will return your call as soon as possible. Messages left after 4 pm will be answered the following business day.   You may also send us a message via MyChart. We typically respond to MyChart messages within 1-2 business days.  For prescription refills, please ask your pharmacy to contact our office. Our fax number is [phone].  If you have an urgent issue when the clinic is closed that cannot wait until the next business day, you can page your doctor at the number below.    Please note that while we do our best to be available for urgent issues outside of office hours, we are not available 24/7.   If you have an urgent issue and are unable to reach us, you may choose to seek medical care at your doctor's office, retail clinic, urgent care center, or emergency room.  If you have a medical emergency, please immediately call 911 or go to the  emergency department.  Pager Numbers  - Dr. Kowalski: [phone]  - Dr. Moye: [phone]  - Dr. Stewart: [phone]  In the event of inclement weather, please call our main line at [phone] for an update on the status of any delays or closures.  Dermatology Medication Tips: Please keep the boxes that topical medications come in in order to help keep track of the instructions about where and how to use these. Pharmacies typically print the medication instructions only on the boxes and not directly on the medication tubes.   If your medication is too expensive, please contact our office at [phone] option 4 or send us a message through MyChart.   We are unable to tell what your co-pay for medications will be in advance as this is different depending on your insurance coverage. However, we may be able to find a substitute medication at lower cost or fill out paperwork to get insurance to cover a needed medication.   If a prior authorization is required to get your medication covered by your insurance company, please allow us 1-2 business days to complete this process.  Drug prices often vary depending on where the prescription is filled and some pharmacies may offer cheaper prices.  The website www.goodrx.com contains coupons for medications through different pharmacies. The prices here do not account for what the cost may be with help from insurance (it may be cheaper with your insurance), but the website can   give you the price if you did not use any insurance.  - You can print the associated coupon and take it with your prescription to the pharmacy.  - You may also stop by our office during regular business hours and pick up a GoodRx coupon card.  - If you need your prescription sent electronically to a different pharmacy, notify our office through [facility] MyChart or by phone at [phone] option 4.     Si Usted Necesita Algo Despus de Su Visita  Tambin puede  enviarnos un mensaje a travs de MyChart. Por lo general respondemos a los mensajes de MyChart en el transcurso de 1 a 2 das hbiles.  Para renovar recetas, por favor pida a su farmacia que se ponga en contacto con nuestra oficina. Nuestro nmero de fax es el [phone].  Si tiene un asunto urgente cuando la clnica est cerrada y que no puede esperar hasta el siguiente da hbil, puede llamar/localizar a su doctor(a) al nmero que aparece a continuacin.   Por favor, tenga en cuenta que aunque hacemos todo lo posible para estar disponibles para asuntos urgentes fuera del horario de oficina, no estamos disponibles las 24 horas del da, los 7 das de la semana.   Si tiene un problema urgente y no puede comunicarse con nosotros, puede optar por buscar atencin mdica  en el consultorio de su doctor(a), en una clnica privada, en un centro de atencin urgente o en una sala de emergencias.  Si tiene una emergencia mdica, por favor llame inmediatamente al 911 o vaya a la sala de emergencias.  Nmeros de bper  - Dr. Kowalski: [phone]  - Dra. Moye: [phone]  - Dra. Stewart: [phone]  En caso de inclemencias del tiempo, por favor llame a nuestra lnea principal al [phone] para una actualizacin sobre el estado de cualquier retraso o cierre.  Consejos para la medicacin en dermatologa: Por favor, guarde las cajas en las que vienen los medicamentos de uso tpico para ayudarle a seguir las instrucciones sobre dnde y cmo usarlos. Las farmacias generalmente imprimen las instrucciones del medicamento slo en las cajas y no directamente en los tubos del medicamento.   Si su medicamento es muy caro, por favor, pngase en contacto con nuestra oficina llamando al [phone] y presione la opcin 4 o envenos un mensaje a travs de MyChart.   No podemos decirle cul ser su copago por los medicamentos por adelantado ya que esto es diferente dependiendo de la cobertura de su seguro.  Sin embargo, es posible que podamos encontrar un medicamento sustituto a menor costo o llenar un formulario para que el seguro cubra el medicamento que se considera necesario.   Si se requiere una autorizacin previa para que su compaa de seguros cubra su medicamento, por favor permtanos de 1 a 2 das hbiles para completar este proceso.  Los precios de los medicamentos varan con frecuencia dependiendo del lugar de dnde se surte la receta y alguna farmacias pueden ofrecer precios ms baratos.  El sitio web www.goodrx.com tiene cupones para medicamentos de diferentes farmacias. Los precios aqu no tienen en cuenta lo que podra costar con la ayuda del seguro (puede ser ms barato con su seguro), pero el sitio web puede darle el precio si no utiliz ningn seguro.  - Puede imprimir el cupn correspondiente y llevarlo con su receta a la farmacia.  - Tambin puede pasar por nuestra oficina durante el horario de atencin regular y recoger una tarjeta de cupones de GoodRx.  -   Si necesita que su receta se enve electrnicamente a una farmacia diferente, informe a nuestra oficina a travs de MyChart de [place] o por telfono llamando al [phone] y presione la opcin 4.  

## 2023-04-07 ENCOUNTER — Encounter: Payer: Self-pay | Admitting: Dermatology

## 2023-04-12 ENCOUNTER — Non-Acute Institutional Stay (SKILLED_NURSING_FACILITY): Payer: Medicare Other | Admitting: Nurse Practitioner

## 2023-04-12 ENCOUNTER — Encounter: Payer: Self-pay | Admitting: Nurse Practitioner

## 2023-04-12 DIAGNOSIS — Z85828 Personal history of other malignant neoplasm of skin: Secondary | ICD-10-CM

## 2023-04-12 DIAGNOSIS — H6012 Cellulitis of left external ear: Secondary | ICD-10-CM

## 2023-04-12 NOTE — Progress Notes (Signed)
Location:  Other [facility] [place].  Nursing Home Room Number: [facility] [address] Place of Service:  SNF [phone]) Abbey Chatters, NP  PCP: Earnestine Mealing, MD  Patient Care Team: Earnestine Mealing, MD as PCP - General (Family Medicine) Sondra Come, MD as Consulting Physician (Urology) Orlie Dakin Tollie Pizza, MD as Consulting Physician (Oncology) Wyn Quaker Marlow Baars, MD as Referring Physician (Vascular Surgery)  Extended Emergency Contact Information Primary Emergency Contact: [name],[name] Home Phone: [phone] Mobile Phone: [phone] Relation: Daughter Secondary Emergency Contact: warner,[name] Mobile Phone: [phone] Relation: Daughter Interpreter needed? No  Goals of care: Advanced Directive information    04/12/2023    2:13 PM  Advanced Directives  Does Patient Have a Medical Advance Directive? Yes  Type of Estate agent of [place];Out of facility DNR (pink MOST or yellow form);Living will  Does patient want to make changes to medical advance directive? No - Patient declined  Copy of Healthcare Power of Attorney in Chart? Yes - validated most recent copy scanned in chart (See row information)     Chief Complaint  Patient presents with   Acute Visit    Ear Concerns    HPI:  Pt is a 82 y.o. male seen today for an acute visit for Ear Concerns. 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 Nursing reported area has become more red, swollen, crusty and painful over the last several days.  Pt denies fevers or chills.  Reports area is painful but pain has not significantly increased.    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 chin. Nodular and infiltrative patterns.EDC.   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 Nodular. EDC   Basal cell carcinoma 08/26/2020   Right mid to inf. ear helix. Nodular, ulcerated. 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 Ulcerated.   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   L upper eyelid, exc and ED, pt had BCC of L upper eyelid and excision also included this SCC   Past Surgical History:  Procedure Laterality Date   BACK SURGERY     CATARACT EXTRACTION     HERNIA REPAIR     PORTA CATH INSERTION N/A 01/01/2019   Procedure: PORTA CATH INSERTION;  Surgeon: Annice Needy, MD;  Location: [facility] INVASIVE CV LAB;  Service: Cardiovascular;  Laterality: N/A;   TONSILLECTOMY     TRANSURETHRAL RESECTION OF BLADDER TUMOR N/A 12/08/2018   Procedure: TRANSURETHRAL RESECTION OF BLADDER TUMOR (TURBT);  Surgeon: Sondra Come, MD;  Location: [facility] ORS;  Service: Urology;  Laterality: N/A;    No Known Allergies  Outpatient Encounter Medications as of 04/12/2023  Medication Sig   atorvastatin (LIPITOR) 20 MG tablet Take 20 mg by mouth daily.    lidocaine (LIDODERM) 5 % Place 1 patch onto the skin daily. Remove & Discard patch within 12 hours  or as directed by MD   magnesium chloride (SLOW-MAG) 64 MG TBEC SR tablet Take 1 tablet by mouth at bedtime.   metoprolol succinate (TOPROL-XL) 25 MG 24 hr tablet Take 12.5 mg by mouth daily.   Multiple Vitamins-Minerals (CENTRUM MEN) TABS Take 1 tablet by mouth daily.   mupirocin ointment (BACTROBAN) 2 % Apply 1 Application topically daily. Qd to excision site   nystatin powder Apply to groin/affected areas topically as needed   Vitamin D, Ergocalciferol, (DRISDOL) 1.25 MG (50000 UT) CAPS  capsule Take 50,000 Units by mouth every 7 (seven) days.   No facility-administered encounter medications on file as of 04/12/2023.    Review of Systems  Constitutional:  Negative for activity change and appetite change.  Skin:  Positive for color change and wound.    Immunization History  Administered Date(s) Administered   Covid-19, Mrna,Vaccine(Spikevax)25yrs and older 03/08/2023   Influenza Inj Mdck Quad Pf 08/25/2021, 08/31/2022   Influenza-Unspecified 08/27/2014, 09/03/2015, 08/31/2016, 08/29/2017   Moderna Sars-Covid-2 Vaccination 12/11/2019, 01/08/2020, 10/08/2020, 08/20/2021   Pneumococcal Conjugate-13 03/21/2014, 06/02/2017   Pneumococcal Polysaccharide-23 04/08/2016   Tdap 12/05/2017   Pertinent  Health Maintenance Due  Topic Date Due   INFLUENZA VACCINE  06/30/2023      05/26/2019    4:21 PM 11/23/2022   10:33 PM 11/24/2022   10:00 PM 11/25/2022    8:30 PM 11/26/2022    9:00 AM  Fall Risk  (RETIRED) Patient Fall Risk Level High fall risk High fall risk High fall risk High fall risk High fall risk   Functional Status Survey:    Vitals:   04/12/23 1408  BP: 100/65  Pulse: 67  Resp: 20  Temp: (!) 97.5 F (36.4 C)  SpO2: 97%  Weight: 160 lb (72.6 kg)  Height: 5\' 8"  (1.727 m)   Body mass index is 24.33 kg/m. Physical Exam Constitutional:      Appearance: Normal appearance.  Skin:    Findings: Erythema (with swelling to left ear) present.  Neurological:     Mental Status: He is alert. Mental status is at baseline.  Psychiatric:        Mood and Affect: Mood normal.     Labs reviewed: Recent Labs    11/23/22 2249 11/25/22 0619 01/27/23 0000  NA 138 140 137  K 4.4 3.9 4.7  CL 105 109 103  CO2 22 24 30*  GLUCOSE 90 112*  --   BUN 25* 21 30*  CREATININE 1.21 1.04 1.2  CALCIUM 9.8 8.5* 8.9   Recent Labs    01/27/23 0000  AST 12*  ALT 11  ALKPHOS 91  ALBUMIN 3.3*   Recent Labs    11/23/22 2249 11/25/22 0619 01/27/23 0000  WBC  12.6* 13.9* 7.8  HGB 13.7 11.7* 12.1*  HCT 43.0 35.5* 36*  MCV 95.6 94.4  --   PLT 482* 433* 419*   No results found for: "TSH" No results found for: "HGBA1C" Lab Results  Component Value Date   CHOL 140 03/10/2023   HDL 43 03/10/2023   LDLCALC 82 03/10/2023   TRIG 71 03/10/2023    Significant Diagnostic Results in last 30 days:  No results found.  Assessment/Plan 1. Cellulitis of left ear -had basal cell removed on 03/29/23 nursing reports increase in redness to left ear and ongoing/more complaints of tenderness with swelling Will start doxycycline 100 mg BID for 7 days and have staff monitor for worsening redness, pain, discharge.   2. History of basal cell cancer -removed  by dermatology on 4/30, continue wound care and notify if increase in drainage, yellow slough or tenderness.    Janene Harvey. [facility] [phone]

## 2023-04-18 ENCOUNTER — Encounter: Payer: Self-pay | Admitting: Student

## 2023-04-18 ENCOUNTER — Non-Acute Institutional Stay (SKILLED_NURSING_FACILITY): Payer: Medicare Other | Admitting: Student

## 2023-04-18 DIAGNOSIS — I129 Hypertensive chronic kidney disease with stage 1 through stage 4 chronic kidney disease, or unspecified chronic kidney disease: Secondary | ICD-10-CM

## 2023-04-18 DIAGNOSIS — N183 Chronic kidney disease, stage 3 unspecified: Secondary | ICD-10-CM

## 2023-04-18 DIAGNOSIS — S32020D Wedge compression fracture of second lumbar vertebra, subsequent encounter for fracture with routine healing: Secondary | ICD-10-CM | POA: Diagnosis not present

## 2023-04-18 DIAGNOSIS — Z936 Other artificial openings of urinary tract status: Secondary | ICD-10-CM | POA: Diagnosis not present

## 2023-04-18 NOTE — Progress Notes (Signed)
Location:  Other [facility] [place].  Nursing Home Room Number: [facility] [address] Place of Service:  SNF [phone]) Provider:  Earnestine Mealing, MD  Patient Care Team: Earnestine Mealing, MD as PCP - General (Family Medicine) Sondra Come, MD as Consulting Physician (Urology) Jeralyn Ruths, MD as Consulting Physician (Oncology) Wyn Quaker Marlow Baars, MD as Referring Physician (Vascular Surgery)  Extended Emergency Contact Information Primary Emergency Contact: [name],[name] Home Phone: [phone] Mobile Phone: [phone] Relation: Daughter Secondary Emergency Contact: warner,[name] Mobile Phone: [phone] Relation: Daughter Interpreter needed? No  Code Status:  DNR Goals of care: Advanced Directive information    04/18/2023   10:18 AM  Advanced Directives  Does Patient Have a Medical Advance Directive? Yes  Type of Estate agent of [place];Out of facility DNR (pink MOST or yellow form);Living will  Does patient want to make changes to medical advance directive? No - Patient declined  Copy of Healthcare Power of Attorney in Chart? Yes - validated most recent copy scanned in chart (See row information)     Chief Complaint  Patient presents with   Medical Management of Chronic Issues    Medical Management of Chronic Issues.     HPI:  Pt is a 82 y.o. male seen today for medical management of chronic diseases.    He is doing well.   He had a skin infection on the ear from his recent skin cancer removal but things are improving. He is scheduled for a haircut today. He is eating and drinking well. Drinks ~6-8 glasses of water per day.   Patient refuses to wear TSLO brace at this time due to discomfort.   Nursing without concerns.    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 chin. Nodular and infiltrative patterns.EDC.   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 Nodular. EDC   Basal cell  carcinoma 08/26/2020   Right mid to inf. ear helix. Nodular, ulcerated. 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 Ulcerated.   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   L upper eyelid, exc and ED, pt had BCC of L upper eyelid and excision also included this SCC   Past Surgical History:  Procedure Laterality Date   BACK SURGERY     CATARACT EXTRACTION     HERNIA REPAIR     PORTA CATH INSERTION N/A 01/01/2019   Procedure: PORTA CATH INSERTION;  Surgeon: Annice Needy, MD;  Location: [facility] INVASIVE CV LAB;  Service: Cardiovascular;  Laterality: N/A;   TONSILLECTOMY     TRANSURETHRAL RESECTION OF BLADDER TUMOR N/A 12/08/2018   Procedure: TRANSURETHRAL RESECTION OF BLADDER TUMOR (TURBT);  Surgeon: Sondra Come, MD;  Location: [facility] ORS;  Service: Urology;  Laterality: N/A;    No Known Allergies  Outpatient Encounter Medications as of 04/18/2023  Medication Sig   atorvastatin (LIPITOR) 20 MG tablet Take 20 mg by mouth daily.  doxycycline (ADOXA) 100 MG tablet Take 100 mg by mouth 2 (two) times daily.   lidocaine (LIDODERM) 5 % Place 1 patch onto the skin daily. Remove & Discard patch within 12 hours or as directed by MD   magnesium chloride (SLOW-MAG) 64 MG TBEC SR tablet Take 1 tablet by mouth at bedtime.   metoprolol succinate (TOPROL-XL) 25 MG 24 hr tablet Take 12.5 mg by mouth daily.   Multiple Vitamins-Minerals (CENTRUM MEN) TABS Take 1 tablet by mouth daily.   mupirocin ointment (BACTROBAN) 2 % Apply 1 Application  topically daily. Qd to excision site   nystatin powder Apply to groin/affected areas topically as needed   Vitamin D, Ergocalciferol, (DRISDOL) 1.25 MG (50000 UT) CAPS capsule Take 50,000 Units by mouth every 7 (seven) days.   No facility-administered encounter medications on file as of 04/18/2023.    Review of Systems  Immunization History  Administered Date(s) Administered   Covid-19, Mrna,Vaccine(Spikevax)69yrs and older 03/08/2023   Influenza Inj Mdck Quad Pf 08/25/2021, 08/31/2022   Influenza-Unspecified 08/27/2014, 09/03/2015, 08/31/2016, 08/29/2017   Moderna Sars-Covid-2 Vaccination 12/11/2019, 01/08/2020, 10/08/2020, 08/20/2021   Pneumococcal Conjugate-13 03/21/2014, 06/02/2017   Pneumococcal Polysaccharide-23 04/08/2016   Tdap 12/05/2017   Pertinent  Health Maintenance Due  Topic Date Due   INFLUENZA VACCINE  06/30/2023      05/26/2019    4:21 PM 11/23/2022   10:33 PM 11/24/2022   10:00 PM 11/25/2022    8:30 PM 11/26/2022    9:00 AM  Fall Risk  (RETIRED) Patient Fall Risk Level High fall risk High fall risk High fall risk High fall risk High fall risk   Functional Status Survey:    Vitals:   04/18/23 1012  BP: 100/65  Pulse: 67  Resp: 20  Temp: 98.3 F (36.8 C)  SpO2: 97%  Weight: 160 lb (72.6 kg)  Height: 5\' 8"  (1.727 m)   Body mass index is 24.33 kg/m. Physical Exam Constitutional:      Appearance: Normal appearance.  Cardiovascular:     Rate and Rhythm: Normal rate.     Pulses: Normal pulses.  Pulmonary:     Effort: Pulmonary effort is normal.  Abdominal:     General: Abdomen is flat.     Palpations: Abdomen is soft.  Skin:    General: Skin is warm.  Neurological:     Mental Status: He is alert and oriented to person, place, and time.     Labs reviewed: Recent Labs    11/23/22 2249 11/25/22 0619 01/27/23 0000  NA 138 140 137  K 4.4 3.9 4.7  CL 105 109 103  CO2 22 24 30*  GLUCOSE 90 112*  --   BUN 25* 21 30*  CREATININE 1.21 1.04  1.2  CALCIUM 9.8 8.5* 8.9   Recent Labs    01/27/23 0000  AST 12*  ALT 11  ALKPHOS 91  ALBUMIN 3.3*   Recent Labs    11/23/22 2249 11/25/22 0619 01/27/23 0000  WBC 12.6* 13.9* 7.8  HGB 13.7 11.7* 12.1*  HCT 43.0 35.5* 36*  MCV 95.6 94.4  --   PLT 482* 433* 419*   No results found for: "TSH" No results found for: "HGBA1C" Lab Results  Component Value Date   CHOL 140 03/10/2023   HDL 43 03/10/2023   LDLCALC 82 03/10/2023   TRIG 71 03/10/2023    Significant Diagnostic Results in last 30 days:  No results found.  Assessment/Plan 1. 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 CTOM  3. Benign hypertension with CKD (chronic kidney disease) stage III (HCC) BP well-controlled with metoprolol only. No symptoms of orthostasis at this time. Encourage hydration given low BP.   4. Compression fracture of L2 vertebra with routine healing, subsequent encounter Occurred on 11/24/22. Healing well. Followed by NSG. Continue PT as indicated. Continue Vitamin D. Lidoderm PRN  Family/ staff Communication: nursing  Labs/tests ordered:  none

## 2023-04-22 ENCOUNTER — Other Ambulatory Visit: Payer: Self-pay | Admitting: Orthopedic Surgery

## 2023-04-22 DIAGNOSIS — S32020A Wedge compression fracture of second lumbar vertebra, initial encounter for closed fracture: Secondary | ICD-10-CM

## 2023-04-22 NOTE — Progress Notes (Unsigned)
Referring Physician:  Earnestine Mealing, MD [address] [place],  [place] [address]  Primary Physician:  Earnestine Mealing, MD  History of Present Illness: 04/27/2023 Philip [name] has a history of HTN, CKD 3, bladder CA.   He was seen in ED on 11/24/22 and found to have L2 fracture and possible L4 fracture. Appears he was placed in LSO brace and lost to follow up.   He is here for evaluation.   He has no current back pain. No leg pain. No numbness, tingling, or weakness. Has not been wearing brace. He ambulates with a walker.   He thinks he has history of cervical surgery years ago.   He has osteomy bag s/p bladder CA. No bowel issues.   Review of Systems:  A 10 point review of systems is negative, except for the pertinent positives and negatives detailed in the HPI.  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 chin. Nodular and infiltrative patterns.EDC.   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 Nodular. EDC   Basal cell carcinoma 08/26/2020   Right mid to inf. ear helix. Nodular, ulcerated. 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 Ulcerated.   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   L upper eyelid, exc and ED, pt had BCC of L upper eyelid and excision also included this SCC    Past Surgical History: Past Surgical History:  Procedure Laterality Date   BACK SURGERY     CATARACT EXTRACTION     HERNIA REPAIR     PORTA  CATH INSERTION N/A 01/01/2019   Procedure: PORTA CATH INSERTION;  Surgeon: Annice Needy, MD;  Location: [facility] INVASIVE CV LAB;  Service: Cardiovascular;  Laterality: N/A;   TONSILLECTOMY     TRANSURETHRAL RESECTION OF BLADDER TUMOR N/A 12/08/2018   Procedure: TRANSURETHRAL RESECTION OF BLADDER TUMOR (TURBT);  Surgeon: Sondra Come, MD;  Location: [facility] ORS;  Service: Urology;  Laterality: N/A;    Allergies: Allergies as of 04/27/2023   (No Known Allergies)    Medications: Outpatient Encounter Medications as of 04/27/2023  Medication Sig   atorvastatin (LIPITOR) 20 MG tablet Take 20 mg by mouth daily.    doxycycline (ADOXA) 100 MG tablet Take 100 mg by mouth 2 (two) times daily.   lidocaine (LIDODERM) 5 % Place 1 patch onto the skin daily. Remove & Discard patch within 12 hours or as directed by MD   magnesium chloride (SLOW-MAG) 64 MG TBEC SR tablet Take 1 tablet by mouth at bedtime.   metoprolol succinate (TOPROL-XL) 25 MG 24 hr tablet Take 12.5 mg by mouth daily.   Multiple Vitamins-Minerals (CENTRUM MEN) TABS Take 1 tablet by mouth daily.   mupirocin ointment (BACTROBAN) 2 % Apply 1 Application topically daily. Qd to excision site   nystatin powder Apply to groin/affected areas topically as needed   Vitamin D, Ergocalciferol, (DRISDOL) 1.25  MG (50000 UT) CAPS capsule Take 50,000 Units by mouth every 7 (seven) days.   No facility-administered encounter medications on file as of 04/27/2023.    Social History: Social History   Tobacco Use   Smoking status: Former    Years: 35    Types: Cigarettes    Quit date: 11/29/1993    Years since quitting: 29.4   Smokeless tobacco: Never  Vaping Use   Vaping Use: Never used  Substance Use Topics   Alcohol use: Yes    Comment: a couple of drinks a day all the above   Drug use: Never    Family Medical History: Family History  Problem Relation Age of Onset   Heart failure Mother     Physical Examination: Vitals:   04/27/23 1408   BP: 130/72    General: Patient is well developed, well nourished, calm, collected, and in no apparent distress. Attention to examination is appropriate.  Respiratory: Patient is breathing without any difficulty.   NEUROLOGICAL:     Awake, alert, oriented to person, place, and time.  Speech is clear and fluent. Fund of knowledge is appropriate.   Cranial Nerves: Pupils equal round and reactive to light.  Facial tone is symmetric.    No posterior lumbar tenderness.   No abnormal lesions on exposed skin.   Strength: Side Biceps Triceps Deltoid Interossei Grip Wrist Ext. Wrist Flex.  R 5 5 5 5 5 5 5   L 5 5 5 5 5 5 5    Side Iliopsoas Quads Hamstring PF DF EHL  R 5 5 5 5 5 5   L 5 5 5 5 5 5    Reflexes are 1+ and symmetric at the biceps, triceps, brachioradialis, patella and achilles.    Clonus is not present.   Bilateral upper and lower extremity sensation is intact to light touch.     Gait not tested. He is in a WC.    Medical Decision Making  Imaging: Lumbar xrays dated 04/27/23:  L4 compression fracture with progression since previous xrays. L2 superior endplate compression is stable.   Report for above xrays not available.    Lumbar xrays dated 11/24/22:  FINDINGS: Normal lumbar segmentation, concordant with thoracic numbering today. Stable lumbar lordosis since 2019, including subtle anterolisthesis of L4 on L5. L2 superior endplate compression is new since 2019 and suspicious for acute fracture in this setting, with some suspected vertebral body fracture lucency visible. Minimal overall L2 vertebral body loss of height. No retropulsion is evident.   L1 and L3 appear intact. L5 appears stable and intact. There is questionable L4 inferior endplate deformity on the lateral views, not apparent on the AP or oblique images. No evidence of pars fracture. Multilevel chronic lumbar facet degeneration. Vance chronic disc and endplate degeneration at L5-S1.   Extensive  Calcified aortic atherosclerosis. Negative visible bowel gas pattern. Grossly intact visible sacrum and SI joints.   IMPRESSION: 1. L2 superior endplate compression fracture, new since 2019 and suspected to be acute. If specific therapy is desired, Lumbar MRI without contrast or Nuclear Medicine Whole-body Bone Scan would confirm candidacy for vertebroplasty. 2. Difficult to exclude L4 inferior endplate compression fracture also, although could be artifact. 3.  Aortic Atherosclerosis (ICD10-I70.0).     Electronically Signed   By: Odessa Fleming M.D.   On: 11/24/2022 07:06  I have personally reviewed the images and agree with the above interpretation.  Assessment and Plan: Philip [name] is a pleasant 82 y.o. male has L2 and L4  compression fractures when in ED on 11/24/22.   He has no current back pain. No leg pain. No numbness, tingling, or weakness.   He has some progression of L4 fracture from initial films. L2 fracture is stable.   Treatment options discussed with patient and following plan made:   - He had some progression of L4 fracture since xrays in December, but clinically he looks good.  - He does not need to wear back brace.  - Avoid bending, twisting, and lifting as able.  - He can follow up prn.   I spent a total of 25 minutes in face-to-face and non-face-to-face activities related to this patient's care today including review of outside records, review of imaging, review of symptoms, physical exam, discussion of differential diagnosis, discussion of treatment options, and documentation.   Thank you for involving me in the care of this patient.   [name] PA-C Dept. of Neurosurgery

## 2023-04-27 ENCOUNTER — Encounter: Payer: Self-pay | Admitting: Orthopedic Surgery

## 2023-04-27 ENCOUNTER — Ambulatory Visit
Admission: RE | Admit: 2023-04-27 | Discharge: 2023-04-27 | Disposition: A | Discharge status: Home / Self Care | Payer: Medicare Other | Source: Ambulatory Visit | Attending: Orthopedic Surgery | Admitting: Orthopedic Surgery

## 2023-04-27 ENCOUNTER — Ambulatory Visit
Admission: RE | Admit: 2023-04-27 | Discharge: 2023-04-27 | Disposition: A | Discharge status: Home / Self Care | Payer: Medicare Other | Attending: Orthopedic Surgery | Admitting: Orthopedic Surgery

## 2023-04-27 ENCOUNTER — Ambulatory Visit (INDEPENDENT_AMBULATORY_CARE_PROVIDER_SITE_OTHER): Payer: Medicare Other | Admitting: Orthopedic Surgery

## 2023-04-27 VITALS — BP 130/72 | Ht 68.0 in | Wt 160.0 lb

## 2023-04-27 DIAGNOSIS — S32020A Wedge compression fracture of second lumbar vertebra, initial encounter for closed fracture: Secondary | ICD-10-CM | POA: Diagnosis not present

## 2023-04-27 DIAGNOSIS — S32040A Wedge compression fracture of fourth lumbar vertebra, initial encounter for closed fracture: Secondary | ICD-10-CM | POA: Diagnosis not present

## 2023-05-25 ENCOUNTER — Ambulatory Visit (INDEPENDENT_AMBULATORY_CARE_PROVIDER_SITE_OTHER): Payer: Medicare Other | Admitting: Dermatology

## 2023-05-25 VITALS — BP 142/70 | HR 77

## 2023-05-25 DIAGNOSIS — L820 Inflamed seborrheic keratosis: Secondary | ICD-10-CM | POA: Diagnosis not present

## 2023-05-25 DIAGNOSIS — L570 Actinic keratosis: Secondary | ICD-10-CM | POA: Diagnosis not present

## 2023-05-25 DIAGNOSIS — L578 Other skin changes due to chronic exposure to nonionizing radiation: Secondary | ICD-10-CM

## 2023-05-25 DIAGNOSIS — Z85828 Personal history of other malignant neoplasm of skin: Secondary | ICD-10-CM

## 2023-05-25 DIAGNOSIS — L821 Other seborrheic keratosis: Secondary | ICD-10-CM | POA: Diagnosis not present

## 2023-05-25 DIAGNOSIS — W908XXA Exposure to other nonionizing radiation, initial encounter: Secondary | ICD-10-CM | POA: Diagnosis not present

## 2023-05-25 NOTE — Patient Instructions (Signed)
Due to recent changes in healthcare laws, you may see results of your pathology and/or laboratory studies on MyChart before the doctors have had a chance to review them. We understand that in some cases there may be results that are confusing or concerning to you. Please understand that not all results are received at the same time and often the doctors may need to interpret multiple results in order to provide you with the best plan of care or course of treatment. Therefore, we ask that you please give us 2 business days to thoroughly review all your results before contacting the office for clarification. Should we see a critical lab result, you will be contacted sooner.   If You Need Anything After Your Visit  If you have any questions or concerns for your doctor, please call our main line at [phone] and press option 4 to reach your doctor's medical assistant. If no one answers, please leave a voicemail as directed and we will return your call as soon as possible. Messages left after 4 pm will be answered the following business day.   You may also send us a message via MyChart. We typically respond to MyChart messages within 1-2 business days.  For prescription refills, please ask your pharmacy to contact our office. Our fax number is [phone].  If you have an urgent issue when the clinic is closed that cannot wait until the next business day, you can page your doctor at the number below.    Please note that while we do our best to be available for urgent issues outside of office hours, we are not available 24/7.   If you have an urgent issue and are unable to reach us, you may choose to seek medical care at your doctor's office, retail clinic, urgent care center, or emergency room.  If you have a medical emergency, please immediately call 911 or go to the emergency department.  Pager Numbers  - Dr. Kowalski: [phone]  - Dr. Moye: [phone]  - Dr. Stewart:  [phone]  In the event of inclement weather, please call our main line at [phone] for an update on the status of any delays or closures.  Dermatology Medication Tips: Please keep the boxes that topical medications come in in order to help keep track of the instructions about where and how to use these. Pharmacies typically print the medication instructions only on the boxes and not directly on the medication tubes.   If your medication is too expensive, please contact our office at [phone] option 4 or send us a message through MyChart.   We are unable to tell what your co-pay for medications will be in advance as this is different depending on your insurance coverage. However, we may be able to find a substitute medication at lower cost or fill out paperwork to get insurance to cover a needed medication.   If a prior authorization is required to get your medication covered by your insurance company, please allow us 1-2 business days to complete this process.  Drug prices often vary depending on where the prescription is filled and some pharmacies may offer cheaper prices.  The website www.goodrx.com contains coupons for medications through different pharmacies. The prices here do not account for what the cost may be with help from insurance (it may be cheaper with your insurance), but the website can give you the price if you did not use any insurance.  - You can print the associated coupon and take it with   your prescription to the pharmacy.  - You may also stop by our office during regular business hours and pick up a GoodRx coupon card.  - If you need your prescription sent electronically to a different pharmacy, notify our office through [facility] MyChart or by phone at [phone] option 4.     Si Usted Necesita Algo Despus de Su Visita  Tambin puede enviarnos un mensaje a travs de MyChart. Por lo general respondemos a los mensajes de MyChart en el transcurso de 1 a 2  das hbiles.  Para renovar recetas, por favor pida a su farmacia que se ponga en contacto con nuestra oficina. Nuestro nmero de fax es el [phone].  Si tiene un asunto urgente cuando la clnica est cerrada y que no puede esperar hasta el siguiente da hbil, puede llamar/localizar a su doctor(a) al nmero que aparece a continuacin.   Por favor, tenga en cuenta que aunque hacemos todo lo posible para estar disponibles para asuntos urgentes fuera del horario de oficina, no estamos disponibles las 24 horas del da, los 7 das de la semana.   Si tiene un problema urgente y no puede comunicarse con nosotros, puede optar por buscar atencin mdica  en el consultorio de su doctor(a), en una clnica privada, en un centro de atencin urgente o en una sala de emergencias.  Si tiene una emergencia mdica, por favor llame inmediatamente al 911 o vaya a la sala de emergencias.  Nmeros de bper  - Dr. Kowalski: [phone]  - Dra. Moye: [phone]  - Dra. Stewart: [phone]  En caso de inclemencias del tiempo, por favor llame a nuestra lnea principal al [phone] para una actualizacin sobre el estado de cualquier retraso o cierre.  Consejos para la medicacin en dermatologa: Por favor, guarde las cajas en las que vienen los medicamentos de uso tpico para ayudarle a seguir las instrucciones sobre dnde y cmo usarlos. Las farmacias generalmente imprimen las instrucciones del medicamento slo en las cajas y no directamente en los tubos del medicamento.   Si su medicamento es muy caro, por favor, pngase en contacto con nuestra oficina llamando al [phone] y presione la opcin 4 o envenos un mensaje a travs de MyChart.   No podemos decirle cul ser su copago por los medicamentos por adelantado ya que esto es diferente dependiendo de la cobertura de su seguro. Sin embargo, es posible que podamos encontrar un medicamento sustituto a menor costo o llenar un formulario para que el  seguro cubra el medicamento que se considera necesario.   Si se requiere una autorizacin previa para que su compaa de seguros cubra su medicamento, por favor permtanos de 1 a 2 das hbiles para completar este proceso.  Los precios de los medicamentos varan con frecuencia dependiendo del lugar de dnde se surte la receta y alguna farmacias pueden ofrecer precios ms baratos.  El sitio web www.goodrx.com tiene cupones para medicamentos de diferentes farmacias. Los precios aqu no tienen en cuenta lo que podra costar con la ayuda del seguro (puede ser ms barato con su seguro), pero el sitio web puede darle el precio si no utiliz ningn seguro.  - Puede imprimir el cupn correspondiente y llevarlo con su receta a la farmacia.  - Tambin puede pasar por nuestra oficina durante el horario de atencin regular y recoger una tarjeta de cupones de GoodRx.  - Si necesita que su receta se enve electrnicamente a una farmacia diferente, informe a nuestra oficina a travs de MyChart de [place]   o por telfono llamando al [phone] y presione la opcin 4.  

## 2023-05-25 NOTE — Progress Notes (Signed)
   Follow-Up Visit   Subjective  Philip [name] is a 82 y.o. male who presents for the following: Recheck BCC site - left post ear at mid to inf helix - Excised 03/29/23, patient states healing well. The patient has spots, moles and lesions to be evaluated, some may be new or changing.   The following portions of the chart were reviewed this encounter and updated as appropriate: medications, allergies, medical history  Review of Systems:  No other skin or systemic complaints except as noted in HPI or Assessment and Plan.  Objective  Well appearing patient in no apparent distress; mood and affect are within normal limits. A focused examination was performed of the following areas: the face, ears, and scalp Relevant exam findings are noted in the Assessment and Plan.  Scalp and face x 18 (18) Erythematous thin papules/macules with gritty scale.   scalp x 3, R sideburn x 1 (4) Erythematous stuck-on, waxy papule or plaque   Assessment & Plan   ACTINIC DAMAGE - chronic, secondary to cumulative UV radiation exposure/sun exposure over time - diffuse scaly erythematous macules with underlying dyspigmentation - Recommend daily broad spectrum sunscreen SPF 30+ to sun-exposed areas, reapply every 2 hours as needed.  - Recommend staying in the shade or wearing long sleeves, sun glasses (UVA+UVB protection) and wide brim hats (4-inch brim around the entire circumference of the hat). - Call for new or changing lesions.  HISTORY OF BASAL CELL CARCINOMA OF THE SKIN - No evidence of recurrence today - Recommend regular full body skin exams - Recommend daily broad spectrum sunscreen SPF 30+ to sun-exposed areas, reapply every 2 hours as needed.  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   Destruction of lesion - scalp x 3, R sideburn x 1 Complexity: simple   Destruction method: cryotherapy   Informed consent: discussed and consent obtained   Timeout:  patient name, date of birth, surgical site, and procedure verified Lesion destroyed using liquid nitrogen: Yes   Region frozen until ice ball extended beyond lesion: Yes   Outcome: patient tolerated procedure well with no complications   Post-procedure details: wound care instructions given     SEBORRHEIC KERATOSIS - Stuck-on, waxy, tan-brown papules and/or plaques  - Benign-appearing - Discussed benign etiology and prognosis. - Observe - Call for any changes  Return for appointment as scheduled.  Maylene Roes, CMA, am acting as scribe for Armida Sans, MD .  Documentation: I have reviewed the above documentation for accuracy and completeness, and I agree with the above.  Armida Sans, MD

## 2023-05-26 ENCOUNTER — Non-Acute Institutional Stay (SKILLED_NURSING_FACILITY): Payer: Medicare Other | Admitting: Nurse Practitioner

## 2023-05-26 ENCOUNTER — Encounter: Payer: Self-pay | Admitting: Nurse Practitioner

## 2023-05-26 DIAGNOSIS — Z85828 Personal history of other malignant neoplasm of skin: Secondary | ICD-10-CM | POA: Diagnosis not present

## 2023-05-26 DIAGNOSIS — E782 Mixed hyperlipidemia: Secondary | ICD-10-CM

## 2023-05-26 DIAGNOSIS — Z936 Other artificial openings of urinary tract status: Secondary | ICD-10-CM

## 2023-05-26 DIAGNOSIS — I129 Hypertensive chronic kidney disease with stage 1 through stage 4 chronic kidney disease, or unspecified chronic kidney disease: Secondary | ICD-10-CM

## 2023-05-26 DIAGNOSIS — N183 Chronic kidney disease, stage 3 unspecified: Secondary | ICD-10-CM

## 2023-05-26 DIAGNOSIS — Z8781 Personal history of (healed) traumatic fracture: Secondary | ICD-10-CM

## 2023-05-26 NOTE — Progress Notes (Signed)
Location:  [facility] Room Number: 502 A Place of Service:  SNF ([phone])  Philip [name], [place], MD  Patient Care Team: Earnestine Mealing, MD as PCP - General (Family Medicine) Sondra Come, MD as Consulting Physician (Urology) Jeralyn Ruths, MD as Consulting Physician (Oncology) Wyn Quaker Marlow Baars, MD as Referring Physician (Vascular Surgery)  Extended Emergency Contact Information Primary Emergency Contact: [name],[name] Home Phone: [phone] Mobile Phone: [phone] Relation: Daughter Secondary Emergency Contact: warner,[name] Mobile Phone: [phone] Relation: Daughter Interpreter needed? No  Goals of care: Advanced Directive information    04/18/2023   10:18 AM  Advanced Directives  Does Patient Have a Medical Advance Directive? Yes  Type of Estate agent of [place];Out of facility DNR (pink MOST or yellow form);Living will  Does patient want to make changes to medical advance directive? No - Patient declined  Copy of Healthcare Power of Attorney in Chart? Yes - validated most recent copy scanned in chart (See row information)     Chief Complaint  Patient presents with   Medical Management of Chronic Issues    Routine follow up   Immunizations    Shingrix vaccine and current COVID booster due    HPI:  Pt is a 82 y.o. [name] seen today for medical management of chronic disease.  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 Had a hard time getting pouch to seal but has gotten this resolved and working well at this time.  Moving bowels well. No complaints of pain.     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 chin. Nodular and infiltrative patterns.EDC.   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 Nodular. EDC   Basal cell carcinoma 08/26/2020   Right mid  to inf. ear helix. Nodular, ulcerated. 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 Ulcerated.   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   L upper eyelid, exc and ED, pt had BCC of L upper eyelid and excision also included this SCC   Past Surgical History:  Procedure Laterality Date   BACK SURGERY     CATARACT EXTRACTION     HERNIA REPAIR     PORTA CATH INSERTION N/A 01/01/2019   Procedure: PORTA CATH INSERTION;  Surgeon: Annice Needy, MD;  Location: [facility] INVASIVE CV LAB;  Service: Cardiovascular;  Laterality: N/A;   TONSILLECTOMY     TRANSURETHRAL RESECTION OF BLADDER TUMOR N/A 12/08/2018   Procedure: TRANSURETHRAL RESECTION OF BLADDER TUMOR (TURBT);  Surgeon: Sondra Come, MD;  Location: [facility] ORS;  Service: Urology;  Laterality: N/A;    No Known Allergies  Outpatient Encounter Medications as of 05/26/2023  Medication Sig   atorvastatin (LIPITOR) 20 MG tablet Take 20 mg by mouth daily.   chlorhexidine (PERIDEX) 0.12 % solution Use as directed in  the mouth or throat at bedtime. 0.5 ounce by mouth at bedtime for mouth care (swish for 30 seconds and spit after evening mouth care. DO NOT RINSE.)   magnesium chloride (SLOW-MAG) 64 MG TBEC SR tablet Take 1 tablet by mouth at bedtime.   metoprolol succinate (TOPROL-XL) 25 MG 24 hr tablet Take 12.5 mg by mouth daily.   Multiple Vitamins-Minerals (CENTRUM MEN) TABS Take 1 tablet by mouth daily.   nystatin powder Apply to groin/affected areas topically as needed   Vitamin D,  Ergocalciferol, (DRISDOL) 1.25 MG (50000 UT) CAPS capsule Take 50,000 Units by mouth every 7 (seven) days.   [DISCONTINUED] lidocaine (LIDODERM) 5 % Place 1 patch onto the skin daily. Remove & Discard patch within 12 hours or as directed by MD (Patient not taking: Reported on 05/25/2023)   [DISCONTINUED] magnesium chloride (SLOW-MAG) 64 MG TBEC SR tablet Take 1 tablet by mouth at bedtime.   No facility-administered encounter medications on file as of 05/26/2023.    Review of Systems  Constitutional:  Negative for activity change, appetite change, fatigue and unexpected weight change.  HENT:  Negative for congestion and hearing loss.   Eyes: Negative.   Respiratory:  Negative for cough and shortness of breath.   Cardiovascular:  Negative for chest pain, palpitations and leg swelling.  Gastrointestinal:  Negative for abdominal pain, constipation and diarrhea.  Genitourinary:  Negative for difficulty urinating and dysuria.  Musculoskeletal:  Negative for arthralgias and myalgias.  Skin:  Negative for color change and wound.  Neurological:  Negative for dizziness and weakness.  Psychiatric/Behavioral:  Negative for agitation, behavioral problems and confusion.      Immunization History  Administered Date(s) Administered   Covid-19, Mrna,Vaccine(Spikevax)60yrs and older 03/08/2023   Influenza Inj Mdck Quad Pf 08/25/2021, 08/31/2022   Influenza-Unspecified 08/27/2014, 09/03/2015, 08/31/2016, 08/29/2017   Moderna Sars-Covid-2 Vaccination 12/11/2019, 01/08/2020, 10/08/2020, 08/20/2021   Pneumococcal Conjugate-13 03/21/2014, 06/02/2017   Pneumococcal Polysaccharide-23 04/08/2016   Tdap 12/05/2017   Pertinent  Health Maintenance Due  Topic Date Due   INFLUENZA VACCINE  06/30/2023      11/23/2022   10:33 PM 11/24/2022   10:00 PM 11/25/2022    8:30 PM 11/26/2022    9:00 AM 04/24/2023    9:06 PM  Fall Risk  Falls in the past year?     1  Was there an injury with Fall?     1  Fall Risk  Category Calculator     3  (RETIRED) Patient Fall Risk Level High fall risk High fall risk High fall risk High fall risk   Patient at Risk for Falls Due to     History of fall(s);Impaired balance/gait;Impaired mobility  Fall risk Follow up     Falls prevention discussed   Functional Status Survey:    Vitals:   05/26/23 1400  BP: 106/68  Pulse: 84  Resp: 20  Temp: 98.3 F (36.8 C)  SpO2: 97%  Weight: 165 lb (74.8 kg)  Height: 5\' 8"  (1.727 m)   Body mass index is 25.09 kg/m. Physical Exam Constitutional:      General: He is not in acute distress.    Appearance: He is well-developed. He is not diaphoretic.  HENT:     Head: Normocephalic and atraumatic.     Right Ear: External ear normal.     Left Ear: External ear normal.     Mouth/Throat:     Pharynx: No oropharyngeal exudate.  Eyes:     Conjunctiva/sclera: Conjunctivae normal.  Pupils: Pupils are equal, round, and reactive to light.  Cardiovascular:     Rate and Rhythm: Normal rate and regular rhythm.     Heart sounds: Normal heart sounds.  Pulmonary:     Effort: Pulmonary effort is normal.     Breath sounds: Normal breath sounds.  Abdominal:     General: Bowel sounds are normal.     Palpations: Abdomen is soft.  Musculoskeletal:        General: No tenderness.     Cervical back: Normal range of motion and neck supple.     Right lower leg: No edema.     Left lower leg: No edema.  Skin:    General: Skin is warm and dry.  Neurological:     Mental Status: He is alert and oriented to person, place, and time.     Labs reviewed: Recent Labs    11/23/22 2249 11/25/22 0619 01/27/23 0000  NA 138 140 137  K 4.4 3.9 4.7  CL 105 109 103  CO2 22 24 30*  GLUCOSE 90 112*  --   BUN 25* 21 30*  CREATININE 1.21 1.04 1.2  CALCIUM 9.8 8.5* 8.9   Recent Labs    01/27/23 0000  AST 12*  ALT 11  ALKPHOS 91  ALBUMIN 3.3*   Recent Labs    11/23/22 2249 11/25/22 0619 01/27/23 0000  WBC 12.6* 13.9* 7.8  HGB  13.7 11.7* 12.1*  HCT 43.0 35.5* 36*  MCV 95.6 94.4  --   PLT 482* 433* 419*   No results found for: "TSH" No results found for: "HGBA1C" Lab Results  Component Value Date   CHOL 140 03/10/2023   HDL 43 03/10/2023   LDLCALC 82 03/10/2023   TRIG 71 03/10/2023    Significant Diagnostic Results in last 30 days:  DG Lumbar Spine Complete  Result Date: 05/01/2023 CLINICAL DATA:  L2 compression fracture EXAM: LUMBAR SPINE - COMPLETE 4+ VIEW COMPARISON:  11/24/2022 FINDINGS: Frontal, lateral neutral, lateral flexion, lateral extension views of the lumbar spine are obtained. There are 5 non-rib-bearing lumbar type vertebral bodies again identified. The superior endplate compression deformity at the L2 level is unchanged, with less than 10% loss of height. Since the prior exam, there is been progression of the L4 compression fracture with approximately 25% loss of height on this exam. No retropulsion. No acute bony abnormalities. Stable multilevel spondylosis and facet hypertrophy, most pronounced at the lumbosacral junction. Diffuse atherosclerosis. No instability during flexion or extension. IMPRESSION: 1. 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 2. No acute bony abnormalities. 3. Stable multilevel degenerative change. Electronically Signed   By: Sharlet Salina M.D.   On: 05/01/2023 22:20    Assessment/Plan 1. Benign hypertension with CKD (chronic kidney disease) stage III (HCC) -Chronic and stable Encourage proper hydration Follow metabolic panel Avoid nephrotoxic meds (NSAIDS)  2. History of basal cell cancer Continues to follow up with dermatology. Area are healing well.   3. 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   4. Mixed hyperlipidemia LDL at goal on lipitor 20 mg daily.   5. Hx of compression fracture of spine No complaints of pain, continues on vit d supplement     Lekeisha Arenas K. Biagio Borg [facility] [phone]

## 2023-05-27 ENCOUNTER — Encounter: Payer: Self-pay | Admitting: Dermatology

## 2023-07-22 ENCOUNTER — Encounter: Payer: Self-pay | Admitting: Student

## 2023-07-22 ENCOUNTER — Non-Acute Institutional Stay (SKILLED_NURSING_FACILITY): Payer: Medicare Other | Admitting: Student

## 2023-07-22 DIAGNOSIS — E7800 Pure hypercholesterolemia, unspecified: Secondary | ICD-10-CM

## 2023-07-22 DIAGNOSIS — R7303 Prediabetes: Secondary | ICD-10-CM

## 2023-07-22 DIAGNOSIS — N183 Chronic kidney disease, stage 3 unspecified: Secondary | ICD-10-CM

## 2023-07-22 DIAGNOSIS — Z936 Other artificial openings of urinary tract status: Secondary | ICD-10-CM

## 2023-07-22 DIAGNOSIS — I129 Hypertensive chronic kidney disease with stage 1 through stage 4 chronic kidney disease, or unspecified chronic kidney disease: Secondary | ICD-10-CM

## 2023-07-22 DIAGNOSIS — S32020D Wedge compression fracture of second lumbar vertebra, subsequent encounter for fracture with routine healing: Secondary | ICD-10-CM | POA: Diagnosis not present

## 2023-07-22 NOTE — Progress Notes (Signed)
Location:  [facility] [place]  [facility]ing Home Room Number: [facility] 502A Place of Service:  SNF [phone]) Provider:  Earnestine Mealing, MD  Patient Care Team: Earnestine Mealing, MD as PCP - General (Family Medicine) Sondra Come, MD as Consulting Physician (Urology) Jeralyn Ruths, MD as Consulting Physician (Oncology) Wyn Quaker Marlow Baars, MD as Referring Physician (Vascular Surgery)  Extended Emergency Contact Information Primary Emergency Contact: [name],[name] Home Phone: [phone] Mobile Phone: [phone] Relation: Daughter Secondary Emergency Contact: warner,[name] Mobile Phone: [phone] Relation: Daughter Interpreter needed? No  Code Status:  DNR Goals of care: Advanced Directive information    07/22/2023   11:28 AM  Advanced Directives  Does Patient Have a Medical Advance Directive? Yes  Type of Estate agent of [place];Living will;Out of facility DNR (pink MOST or yellow form)  Does patient want to make changes to medical advance directive? No - Patient declined  Copy of Healthcare Power of Attorney in Chart? Yes - validated most recent copy scanned in chart (See row information)     Chief Complaint  Patient presents with   Medical Management of Chronic Issues    Medical Management of Chronic Issues.     HPI:  Pt is a 82 y.o. male seen today for medical management of chronic diseases.    Patient is doing well. He has no concerns. He has been watching the [facility] a lot and reading articles. He is going to the Men's lunch today and looking forward. He plays noodle ball and various activities on the hall.   Denies issues with urination (urostomy in place), constipation, diarrhea. Denies chest pain or shortness of breath.   His daughter may plan something for his birthday next month.   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 chin. Nodular and infiltrative patterns.EDC.   Basal cell  carcinoma 12/21/2018   Right sideburn preauricular. Nodular. EDC   Basal cell carcinoma 08/26/2020   Right mid to inf. ear helix. Nodular, ulcerated. 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 Ulcerated.   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   L upper eyelid, exc and ED, pt had BCC of L upper eyelid and excision also included this SCC   Past Surgical History:  Procedure Laterality Date   BACK SURGERY     CATARACT EXTRACTION     HERNIA REPAIR     PORTA CATH INSERTION N/A 01/01/2019   Procedure: PORTA CATH INSERTION;  Surgeon: Annice Needy, MD;  Location: [facility] INVASIVE CV LAB;  Service: Cardiovascular;  Laterality: N/A;   TONSILLECTOMY     TRANSURETHRAL RESECTION OF BLADDER TUMOR N/A 12/08/2018   Procedure: TRANSURETHRAL RESECTION OF BLADDER TUMOR (TURBT);  Surgeon: Sondra Come, MD;  Location: [facility] ORS;  Service: Urology;  Laterality: N/A;    No Known Allergies  Outpatient Encounter Medications as of 07/22/2023  Medication Sig   atorvastatin (LIPITOR) 20 MG tablet Take 20 mg  by mouth daily.   chlorhexidine (PERIDEX) 0.12 % solution Use as directed in the mouth or throat at bedtime. 0.5 ounce by mouth at bedtime for mouth care (swish for 30 seconds and spit after evening mouth care. DO NOT RINSE.)   magnesium chloride (SLOW-MAG) 64 MG TBEC SR tablet Take 1 tablet by mouth at bedtime.   metoprolol succinate (TOPROL-XL) 25 MG 24 hr tablet Take 12.5 mg by mouth daily.   Multiple Vitamins-Minerals (CENTRUM MEN) TABS Take 1  tablet by mouth daily.   nystatin powder Apply to groin/affected areas topically as needed   Vitamin D, Ergocalciferol, (DRISDOL) 1.25 MG (50000 UT) CAPS capsule Take 50,000 Units by mouth every 7 (seven) days.   No facility-administered encounter medications on file as of 07/22/2023.    Review of Systems  Immunization History  Administered Date(s) Administered   Covid-19, Mrna,Vaccine(Spikevax)34yrs and older 03/08/2023   Influenza Inj Mdck Quad Pf 08/25/2021, 08/31/2022   Influenza-Unspecified 08/27/2014, 09/03/2015, 08/31/2016, 08/29/2017   Moderna Sars-Covid-2 Vaccination 12/11/2019, 01/08/2020, 10/08/2020, 08/20/2021   Pneumococcal Conjugate-13 03/21/2014, 06/02/2017   Pneumococcal Polysaccharide-23 04/08/2016   Tdap 12/05/2017   Pertinent  Health Maintenance Due  Topic Date Due   INFLUENZA VACCINE  06/30/2023      11/23/2022   10:33 PM 11/24/2022   10:00 PM 11/25/2022    8:30 PM 11/26/2022    9:00 AM 04/24/2023    9:06 PM  Fall Risk  Falls in the past year?     1  Was there an injury with Fall?     1  Fall Risk Category Calculator     3  (RETIRED) Patient Fall Risk Level High fall risk High fall risk High fall risk High fall risk   Patient at Risk for Falls Due to     History of fall(s);Impaired balance/gait;Impaired mobility  Fall risk Follow up     Falls prevention discussed   Functional Status Survey:    Vitals:   07/22/23 1120  BP: 102/61  Pulse: 76  Resp: 20  Temp: 98.3 F (36.8 C)  SpO2: 97%  Weight: 174 lb (78.9 kg)  Height: 5\' 8"  (1.727 m)   Body mass index is 26.46 kg/m. Physical Exam Vitals reviewed.  Constitutional:      Appearance: Normal appearance.  Cardiovascular:     Rate and Rhythm: Normal rate.     Pulses: Normal pulses.     Heart sounds: Normal heart sounds.  Pulmonary:     Effort: Pulmonary effort is normal.     Breath sounds: Normal breath sounds.  Abdominal:     General: Abdomen is flat.     Palpations: Abdomen is soft.   Skin:    General: Skin is warm and dry.  Neurological:     Mental Status: He is alert and oriented to person, place, and time.     Labs reviewed: Recent Labs    11/23/22 2249 11/25/22 0619 01/27/23 0000  NA 138 140 137  K 4.4 3.9 4.7  CL 105 109 103  CO2 22 24 30*  GLUCOSE 90 112*  --   BUN 25* 21 30*  CREATININE 1.21 1.04 1.2  CALCIUM 9.8 8.5* 8.9   Recent Labs    01/27/23 0000  AST 12*  ALT 11  ALKPHOS 91  ALBUMIN 3.3*   Recent Labs    11/23/22 2249 11/25/22 0619 01/27/23 0000  WBC 12.6* 13.9* 7.8  HGB 13.7 11.7* 12.1*  HCT 43.0 35.5* 36*  MCV 95.6 94.4  --  PLT 482* 433* 419*   No results found for: "TSH" No results found for: "HGBA1C" Lab Results  Component Value Date   CHOL 140 03/10/2023   HDL 43 03/10/2023   LDLCALC 82 03/10/2023   TRIG 71 03/10/2023    Significant Diagnostic Results in last 30 days:  No results found.  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 BP controlled. A1c within goal range. Kidney function stable. Cholesterol within goal range. Kidney function stable at this time. Avoid nephrotoxic agents. Urostomy in place functioning without issue at this time.   Family/ staff Communication: nursing  Labs/tests ordered:  CMP and CBC, q6 labs ordered.

## 2023-09-06 ENCOUNTER — Encounter: Payer: Self-pay | Admitting: Dermatology

## 2023-09-06 ENCOUNTER — Ambulatory Visit (INDEPENDENT_AMBULATORY_CARE_PROVIDER_SITE_OTHER): Payer: Medicare Other | Admitting: Dermatology

## 2023-09-06 VITALS — BP 118/66

## 2023-09-06 DIAGNOSIS — D2239 Melanocytic nevi of other parts of face: Secondary | ICD-10-CM | POA: Diagnosis not present

## 2023-09-06 DIAGNOSIS — W908XXA Exposure to other nonionizing radiation, initial encounter: Secondary | ICD-10-CM | POA: Diagnosis not present

## 2023-09-06 DIAGNOSIS — L820 Inflamed seborrheic keratosis: Secondary | ICD-10-CM | POA: Diagnosis not present

## 2023-09-06 DIAGNOSIS — L570 Actinic keratosis: Secondary | ICD-10-CM | POA: Diagnosis not present

## 2023-09-06 DIAGNOSIS — L578 Other skin changes due to chronic exposure to nonionizing radiation: Secondary | ICD-10-CM | POA: Diagnosis not present

## 2023-09-06 DIAGNOSIS — D229 Melanocytic nevi, unspecified: Secondary | ICD-10-CM

## 2023-09-06 NOTE — Patient Instructions (Addendum)

## 2023-09-06 NOTE — Progress Notes (Signed)
Follow-Up Visit   Subjective  Philip [name] is a 82 y.o. male who presents for the following: AK 71m f/u, check spots scalp, L chin, R medial thigh The patient has spots, moles and lesions to be evaluated, some may be new or changing and the patient may have concern these could be cancer.  Patient accompanied by caregiver.  The following portions of the chart were reviewed this encounter and updated as appropriate: medications, allergies, medical history  Review of Systems:  No other skin or systemic complaints except as noted in HPI or Assessment and Plan.  Objective  Well appearing patient in no apparent distress; mood and affect are within normal limits.   A focused examination was performed of the following areas: Scalp, face, R leg  Relevant exam findings are noted in the Assessment and Plan.  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 They are chronic with expected duration over 1 year. A portion of actinic keratoses will progress to squamous cell carcinoma of the skin. It is not possible to reliably predict which spots will progress to skin cancer and so treatment is recommended to prevent development of skin cancer.  Recommend daily broad spectrum sunscreen SPF 30+ to sun-exposed areas, reapply every 2 hours as needed.  Recommend staying in the shade or wearing long sleeves, sun glasses (UVA+UVB protection) and wide brim hats (4-inch brim around the entire circumference of the hat). Call for new or changing lesions.  Destruction of lesion - scalp, face x 10, arms, hands x 7 (17) Complexity: simple   Destruction method: cryotherapy   Informed consent:  discussed and consent obtained   Timeout:  patient name, date of birth, surgical site, and procedure verified Lesion destroyed using liquid nitrogen: Yes   Region frozen until ice ball extended beyond lesion: Yes   Outcome: patient tolerated procedure well with no complications   Post-procedure details: wound care instructions given    Inflamed seborrheic keratosis (2) L temple within hair x 1, R medial thigh x 1,  Symptomatic, irritating, patient would like treated.  Destruction of lesion - L temple within hair x 1, R medial thigh x 1, (2) Complexity: simple   Destruction method: cryotherapy   Informed consent: discussed and consent obtained   Timeout:  patient name, date of birth, surgical site, and procedure verified Lesion destroyed using liquid nitrogen: Yes   Region frozen until ice ball extended beyond lesion: Yes   Outcome: patient tolerated procedure well with no complications   Post-procedure details: wound care instructions given     ACTINIC DAMAGE - chronic, secondary to cumulative UV radiation exposure/sun exposure over time - diffuse scaly erythematous macules with underlying dyspigmentation - Recommend daily broad spectrum sunscreen SPF 30+ to sun-exposed areas, reapply every 2 hours as needed.  - Recommend staying in the shade or wearing long sleeves, sun glasses (UVA+UVB protection) and wide brim hats (4-inch brim around the entire circumference of the hat). - Call for new or changing lesions.   MELANOCYTIC NEVI L chin Exam: flesh pap L chin  Treatment Plan: Benign appearing on exam today. Recommend observation. Call clinic for new  or changing moles. Recommend daily use of broad spectrum spf 30+ sunscreen to sun-exposed areas.    Return in about 6 months (around 03/06/2024) for AK f/u, ISK f/u recheck ISK L temple within hairline.  I, Ardis Rowan, RMA, am acting as scribe for Armida Sans, MD .   Documentation: I have reviewed the above documentation for  accuracy and completeness, and I agree with the above.  Armida Sans, MD

## 2023-09-13 ENCOUNTER — Non-Acute Institutional Stay (SKILLED_NURSING_FACILITY): Payer: Self-pay | Admitting: Nurse Practitioner

## 2023-09-13 ENCOUNTER — Encounter: Payer: Self-pay | Admitting: Nurse Practitioner

## 2023-09-13 DIAGNOSIS — Z85828 Personal history of other malignant neoplasm of skin: Secondary | ICD-10-CM

## 2023-09-13 DIAGNOSIS — E782 Mixed hyperlipidemia: Secondary | ICD-10-CM | POA: Diagnosis not present

## 2023-09-13 DIAGNOSIS — I129 Hypertensive chronic kidney disease with stage 1 through stage 4 chronic kidney disease, or unspecified chronic kidney disease: Secondary | ICD-10-CM

## 2023-09-13 DIAGNOSIS — Z936 Other artificial openings of urinary tract status: Secondary | ICD-10-CM | POA: Diagnosis not present

## 2023-09-13 DIAGNOSIS — E559 Vitamin D deficiency, unspecified: Secondary | ICD-10-CM

## 2023-09-13 DIAGNOSIS — N183 Chronic kidney disease, stage 3 unspecified: Secondary | ICD-10-CM

## 2023-09-13 NOTE — Progress Notes (Signed)
Careteam: Patient Care Team: Earnestine Mealing, MD as PCP - General (Family Medicine) Sondra Come, MD as Consulting Physician (Urology) Jeralyn Ruths, MD as Consulting Physician (Oncology) Wyn Quaker Marlow Baars, MD as Referring Physician (Vascular Surgery)  PLACE OF SERVICE:  [facility]  Advanced Directive information Does Patient Have a Medical Advance Directive?: Yes, Type of Advance Directive: Healthcare Power of [place];Out of facility DNR (pink MOST or yellow form);Living will, Does patient want to make changes to medical advance directive?: No - Patient declined  No Known Allergies  Chief Complaint  Patient presents with   Medical Management of Chronic Issues    Medical Management of Chronic Issues.      HPI: Patient is a 82 y.o. male seen today for medical management of chronic disease. Seen at [facility] where he is for long term care.  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 Reports he feels good today. No current concerns. Denies pain. Followed by dermatology for actinic keratosis (AK), had some spots treated with cryotherapy, healing well.  Reports urostomy is draining well. States bag gets changed about every 2 weeks. Staying hydrated, likes to drink a lot of water.  Eating 3 meals per day. Sleeping about 8 hours per night.  No concerns from nursing staff.  Review of Systems:  Review of Systems  Constitutional:  Negative for chills, fever and malaise/fatigue.  Respiratory:  Negative for cough and shortness of breath.   Cardiovascular:  Negative for chest pain, palpitations and leg swelling.  Gastrointestinal:  Negative for constipation, diarrhea, nausea and vomiting.  Genitourinary:  Negative for dysuria.  Musculoskeletal:  Negative for joint pain and myalgias.  Neurological:  Negative for dizziness, weakness and headaches.   Psychiatric/Behavioral:  Negative for depression and suicidal ideas. The patient does not have insomnia.     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 chin. Nodular and infiltrative patterns.EDC.   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 Nodular. EDC   Basal cell carcinoma 08/26/2020   Right mid to inf. ear helix. Nodular, ulcerated. 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 Ulcerated.   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   L upper eyelid, exc and ED, pt had BCC of L upper eyelid and excision also included this SCC   Past Surgical History:  Procedure Laterality Date   BACK SURGERY     CATARACT EXTRACTION     HERNIA REPAIR     PORTA CATH INSERTION N/A 01/01/2019   Procedure: PORTA CATH INSERTION;  Surgeon: Annice Needy, MD;  Location: [facility] INVASIVE CV LAB;  Service: Cardiovascular;  Laterality: N/A;   TONSILLECTOMY     TRANSURETHRAL RESECTION OF BLADDER TUMOR N/A 12/08/2018   Procedure: TRANSURETHRAL RESECTION OF BLADDER TUMOR (TURBT);  Surgeon: Sondra Come, MD;  Location: [facility] ORS;  Service: Urology;  Laterality: N/A;   Social History:   reports that he quit smoking about 29 years ago. His smoking use included cigarettes. He started smoking about 64 years ago. He has never used smokeless tobacco. He reports current alcohol use. He reports that he does not use drugs.  Family History  Problem Relation Age of Onset   Heart failure Mother      Medications: Patient's Medications  New Prescriptions   No medications on file  Previous Medications   ATORVASTATIN (LIPITOR) 20 MG TABLET    Take 20 mg by mouth daily.   CHLORHEXIDINE (PERIDEX) 0.12 % SOLUTION    Use as directed in the mouth or throat at bedtime. 0.5 ounce by mouth at bedtime for mouth care (swish for 30 seconds and spit after evening mouth care. DO NOT RINSE.)   MAGNESIUM CHLORIDE (SLOW-MAG) 64 MG TBEC SR TABLET    Take 1 tablet by mouth at bedtime.   METOPROLOL SUCCINATE (TOPROL-XL) 25 MG 24 HR TABLET    Take 12.5 mg by mouth daily.   MULTIPLE VITAMINS-MINERALS (CENTRUM MEN) TABS    Take 1 tablet by mouth daily.   NYSTATIN POWDER    Apply to groin/affected areas topically as needed   POLYETHYLENE GLYCOL (MIRALAX / GLYCOLAX) 17 G PACKET    Take 17 g by mouth daily as needed.   VITAMIN D, ERGOCALCIFEROL, (DRISDOL) 1.25 MG (50000 UT) CAPS CAPSULE    Take 50,000 Units by mouth every 7 (seven) days.  Modified Medications   No medications on file  Discontinued Medications   No medications on file    Physical Exam:  Vitals:   09/13/23 0921  BP: 135/79  Pulse: 84  Resp: 20  Temp: 97.7 F (36.5 C)  SpO2: 98%  Weight: 82 kg  Height: 5\' 8"  (1.727 m)   Body mass index is 27.49 kg/m. Wt Readings from Last 3 Encounters:  09/13/23 180 lb 12.8 oz (82 kg)  07/22/23 174 lb (78.9 kg)  05/26/23 165 lb (74.8 kg)    Physical Exam Constitutional:      Appearance: Normal appearance.  Cardiovascular:     Rate and Rhythm: Normal rate and regular rhythm.     Pulses: Normal pulses.     Heart sounds: Normal heart sounds.  Pulmonary:     Effort: Pulmonary effort is normal.     Breath sounds: Normal breath sounds.  Abdominal:     General: Bowel sounds are normal.     Palpations: Abdomen is soft.  Genitourinary:    Comments: Ileal conduit has white discharge, per patient this is normal. Stoma pink and healthy tissue. Skin:    General: Skin is warm and dry.   Neurological:     General: No focal deficit present.     Mental Status: He is alert and oriented to person, place, and time. Mental status is at baseline.  Psychiatric:        Mood and Affect: Mood normal.        Behavior: Behavior normal.    Labs reviewed: Basic Metabolic Panel: Recent Labs    11/23/22 2249 11/25/22 0619 01/27/23 0000  NA 138 140 137  K 4.4 3.9 4.7  CL 105 109 103  CO2 22 24 30*  GLUCOSE 90 112*  --   BUN 25* 21 30*  CREATININE 1.21 1.04 1.2  CALCIUM 9.8 8.5* 8.9   Liver Function Tests: Recent Labs    01/27/23 0000  AST 12*  ALT 11  ALKPHOS 91  ALBUMIN 3.3*   No results for input(s): "LIPASE", "AMYLASE" in the last 8760 hours. No results for input(s): "AMMONIA" in the last 8760 hours. CBC: Recent Labs    11/23/22 2249 11/25/22 0619 01/27/23 0000  WBC 12.6* 13.9* 7.8  HGB 13.7 11.7* 12.1*  HCT 43.0 35.5* 36*  MCV 95.6 94.4  --   PLT 482* 433* 419*   Lipid Panel: Recent Labs    03/10/23 0000  CHOL 140  HDL 43  LDLCALC 82  TRIG 71   TSH: No results for input(s): "TSH" in the last 8760 hours. A1C: No results found for: "HGBA1C"   Assessment/Plan 1. Benign hypertension with CKD (chronic kidney disease) stage III (HCC) -Chronic and stable, follow BMP -Encouraged proper hydration and nutrition -Avoid nephrotoxic medications -BP controlled, at goal, <140/90 -Continue metoprolol succinate  2. Mixed hyperlipidemia -LDL at goal, continue atorvastatin -Encouraged dietary modifications and physical activity as tolerated  3. 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 Vit d def  Continue supplement   5. History of basal cell cancer -Continues care with dermatology for AK, per note, no evidence of recurrence   Rollen Sox, FNP-MSN Student I personally was present during the history, physical exam and medical decision-making  activities of this service and have verified that the service and findings are accurately documented in the student's note Abbey Chatters, NP

## 2023-09-13 NOTE — Progress Notes (Signed)
error 

## 2023-09-29 LAB — BASIC METABOLIC PANEL
BUN: 25 — AB (ref 4–21)
CO2: 27 — AB (ref 13–22)
Chloride: 99 (ref 99–108)
Creatinine: 1.3 (ref 0.6–1.3)
Glucose: 107
Potassium: 4.5 meq/L (ref 3.5–5.1)
Sodium: 136 — AB (ref 137–147)

## 2023-09-29 LAB — CBC AND DIFFERENTIAL
HCT: 43 (ref 41–53)
Hemoglobin: 14.1 (ref 13.5–17.5)
Platelets: 470 10*3/uL — AB (ref 150–400)
WBC: 8.9

## 2023-09-29 LAB — VITAMIN D 25 HYDROXY (VIT D DEFICIENCY, FRACTURES): Vit D, 25-Hydroxy: 18

## 2023-09-29 LAB — COMPREHENSIVE METABOLIC PANEL
Albumin: 3.6 (ref 3.5–5.0)
Calcium: 9.3 (ref 8.7–10.7)
Globulin: 3
eGFR: 56

## 2023-09-29 LAB — HEPATIC FUNCTION PANEL
ALT: 19 U/L (ref 10–40)
AST: 17 (ref 14–40)
Alkaline Phosphatase: 95 (ref 25–125)
Bilirubin, Total: 0.5

## 2023-09-29 LAB — CBC: RBC: 4.74 (ref 3.87–5.11)

## 2023-10-31 ENCOUNTER — Non-Acute Institutional Stay: Payer: Medicare Other | Admitting: Student

## 2023-10-31 ENCOUNTER — Encounter: Payer: Self-pay | Admitting: Student

## 2023-10-31 DIAGNOSIS — R531 Weakness: Secondary | ICD-10-CM

## 2023-10-31 DIAGNOSIS — E559 Vitamin D deficiency, unspecified: Secondary | ICD-10-CM

## 2023-10-31 DIAGNOSIS — E782 Mixed hyperlipidemia: Secondary | ICD-10-CM | POA: Diagnosis not present

## 2023-10-31 DIAGNOSIS — S32020D Wedge compression fracture of second lumbar vertebra, subsequent encounter for fracture with routine healing: Secondary | ICD-10-CM

## 2023-10-31 DIAGNOSIS — N183 Chronic kidney disease, stage 3 unspecified: Secondary | ICD-10-CM

## 2023-10-31 DIAGNOSIS — I129 Hypertensive chronic kidney disease with stage 1 through stage 4 chronic kidney disease, or unspecified chronic kidney disease: Secondary | ICD-10-CM | POA: Diagnosis not present

## 2023-10-31 NOTE — Progress Notes (Signed)
Location:  Other Nursing Home Room Number: [facility] Place of Service:  SNF [phone]) Provider:  Ander Gaster, Benetta Spar, MD  Patient Care Team: Earnestine Mealing, MD as PCP - General (Family Medicine) Sondra Come, MD as Consulting Physician (Urology) Jeralyn Ruths, MD as Consulting Physician (Oncology) Wyn Quaker Marlow Baars, MD as Referring Physician (Vascular Surgery)  Extended Emergency Contact Information Primary Emergency Contact: warner,[name] Mobile Phone: [phone] Relation: Daughter Interpreter needed? No Secondary Emergency Contact: [name],[name] Home Phone: [phone] Mobile Phone: [phone] Relation: Daughter  Code Status:  DNR Goals of care: Advanced Directive information    09/13/2023    9:26 AM  Advanced Directives  Does Patient Have a Medical Advance Directive? Yes  Type of Estate agent of [place];Out of facility DNR (pink MOST or yellow form);Living will  Does patient want to make changes to medical advance directive? No - Patient declined  Copy of Healthcare Power of Attorney in Chart? Yes - validated most recent copy scanned in chart (See row information)     Chief Complaint  Patient presents with   Medical Managment of Chronic Conditions    HPI:  Pt is a 82 y.o. male seen today for medical management of chronic diseases.    Patient had some nausea over the weekend which is much improved.   He had some left-sided weakness over the weekend which is improving as well. BP >180/90. Denies headaches, chest pain. His main concern is the weakness and wants to do anything to make it better.   He is eating well and having normal bowel movements.   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 chin. Nodular and infiltrative patterns.EDC.   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 Nodular. EDC   Basal cell carcinoma 08/26/2020   Right mid to inf. ear  helix. Nodular, ulcerated. 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 Ulcerated.   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   L upper eyelid, exc and ED, pt had BCC of L upper eyelid and excision also included this SCC   Past Surgical History:  Procedure Laterality Date   BACK SURGERY     CATARACT EXTRACTION     HERNIA REPAIR     PORTA CATH INSERTION N/A 01/01/2019   Procedure: PORTA CATH INSERTION;  Surgeon: Annice Needy, MD;  Location: [facility] INVASIVE CV LAB;  Service: Cardiovascular;  Laterality: N/A;   TONSILLECTOMY     TRANSURETHRAL RESECTION OF BLADDER TUMOR N/A 12/08/2018   Procedure: TRANSURETHRAL RESECTION OF BLADDER TUMOR (TURBT);  Surgeon: Sondra Come, MD;  Location: [facility] ORS;  Service: Urology;  Laterality: N/A;    No Known Allergies  Outpatient Encounter Medications as of 10/31/2023  Medication Sig   atorvastatin (LIPITOR) 20 MG tablet Take 20 mg by mouth daily.   chlorhexidine (PERIDEX) 0.12 % solution Use as directed in the mouth or throat at bedtime. 0.5  ounce by mouth at bedtime for mouth care (swish for 30 seconds and spit after evening mouth care. DO NOT RINSE.)   magnesium chloride (SLOW-MAG) 64 MG TBEC SR tablet Take 1 tablet by mouth at bedtime.   metoprolol succinate (TOPROL-XL) 25 MG 24 hr tablet Take 12.5 mg by mouth daily.   Multiple Vitamins-Minerals (CENTRUM MEN) TABS Take 1 tablet by mouth daily.   nystatin powder Apply to groin/affected areas topically as needed   polyethylene glycol (MIRALAX /  GLYCOLAX) 17 g packet Take 17 g by mouth daily as needed.   Vitamin D, Ergocalciferol, (DRISDOL) 1.25 MG (50000 UT) CAPS capsule Take 50,000 Units by mouth every 7 (seven) days.   No facility-administered encounter medications on file as of 10/31/2023.    Review of Systems  Immunization History  Administered Date(s) Administered   Influenza Inj Mdck Quad Pf 08/25/2021, 08/31/2022   Influenza-Unspecified 08/27/2014, 09/03/2015, 08/31/2016, 08/29/2017   Moderna Covid-19 Fall Seasonal Vaccine 74yrs & older 03/08/2023   Moderna Sars-Covid-2 Vaccination 12/11/2019, 01/08/2020, 10/08/2020, 08/20/2021   Pneumococcal Conjugate-13 03/21/2014, 06/02/2017   Pneumococcal Polysaccharide-23 04/08/2016   Tdap 12/05/2017   Unspecified SARS-COV-2 Vaccination 08/26/2023   Pertinent  Health Maintenance Due  Topic Date Due   INFLUENZA VACCINE  06/30/2023      11/23/2022   10:33 PM 11/24/2022   10:00 PM 11/25/2022    8:30 PM 11/26/2022    9:00 AM 04/24/2023    9:06 PM  Fall Risk  Falls in the past year?     1  Was there an injury with Fall?     1  Fall Risk Category Calculator     3  (RETIRED) Patient Fall Risk Level High fall risk High fall risk High fall risk High fall risk   Patient at Risk for Falls Due to     History of fall(s);Impaired balance/gait;Impaired mobility  Fall risk Follow up     Falls prevention discussed   Functional Status Survey:    Vitals:   10/31/23 0847  BP: 131/80  Pulse: 69  Resp: 16  Temp: 98.1 F (36.7 C)  SpO2: 98%  Weight: 180 lb (81.6 kg)  Height: 5\' 8"  (1.727 m)   Body mass index is 27.37 kg/m. Physical Exam Constitutional:      Appearance: Normal appearance.  Cardiovascular:     Rate and Rhythm: Normal rate and regular rhythm.  Pulmonary:     Effort: Pulmonary effort is normal.     Breath sounds: Normal breath sounds.  Abdominal:     General: Abdomen is flat.     Palpations: Abdomen is soft.  Musculoskeletal:     Comments: 5/5 bilateral grip  strength, triceps, biceps.   Neurological:     Mental Status: He is alert and oriented to person, place, and time.     Labs reviewed: Recent Labs    11/23/22 2249 11/25/22 0619 01/27/23 0000  NA 138 140 137  K 4.4 3.9 4.7  CL 105 109 103  CO2 22 24 30*  GLUCOSE 90 112*  --   BUN 25* 21 30*  CREATININE 1.21 1.04 1.2  CALCIUM 9.8 8.5* 8.9   Recent Labs    01/27/23 0000  AST 12*  ALT 11  ALKPHOS 91  ALBUMIN 3.3*   Recent Labs    11/23/22 2249 11/25/22 0619 01/27/23 0000  WBC 12.6* 13.9* 7.8  HGB 13.7 11.7* 12.1*  HCT 43.0 35.5* 36*  MCV 95.6 94.4  --   PLT 482* 433* 419*  No results found for: "TSH" No results found for: "HGBA1C" Lab Results  Component Value Date   CHOL 140 03/10/2023   HDL 43 03/10/2023   LDLCALC 82 03/10/2023   TRIG 71 03/10/2023    Significant Diagnostic Results in last 30 days:  No results found.  Assessment/Plan Benign hypertension with CKD (chronic kidney disease) stage III (HCC)  Mixed hyperlipidemia  Compression fracture of L2 vertebra with routine healing, subsequent encounter  Prediabetes  Left-sided weakness BP was elevated over the weekend. Will plan for q8 hours BP checks x7 days. If elevated will plan to restart BP meds. Patient's medications previously discontinued due to hypotension contributing to falls. Routine labs ordered 1031 with stable CKD3. Vitamin D low, will plan to continue high dosing for 12 week then recheck levels. For left sided weakness, improving since the weekend. Discussed an outpatient CT scan to evaluate for intracranial process, however, declined since things are improving. Plan for OT eval/treat. F/u BP in 1 week.    Family/ staff Communication: nursing  Labs/tests ordered:  none

## 2023-11-04 ENCOUNTER — Non-Acute Institutional Stay (SKILLED_NURSING_FACILITY): Payer: Self-pay | Admitting: Adult Health

## 2023-11-04 ENCOUNTER — Encounter: Payer: Self-pay | Admitting: Adult Health

## 2023-11-04 DIAGNOSIS — S32040S Wedge compression fracture of fourth lumbar vertebra, sequela: Secondary | ICD-10-CM | POA: Diagnosis not present

## 2023-11-04 DIAGNOSIS — N183 Chronic kidney disease, stage 3 unspecified: Secondary | ICD-10-CM | POA: Diagnosis not present

## 2023-11-04 DIAGNOSIS — I129 Hypertensive chronic kidney disease with stage 1 through stage 4 chronic kidney disease, or unspecified chronic kidney disease: Secondary | ICD-10-CM | POA: Diagnosis not present

## 2023-11-04 DIAGNOSIS — K5901 Slow transit constipation: Secondary | ICD-10-CM

## 2023-11-04 NOTE — Progress Notes (Unsigned)
Location:  Other ([facility] Room Number: 502 A Place of Service:  SNF ([phone]) Provider:  Kenard Gower, DNP, FNP-BC  Patient Care Team: Earnestine Mealing, MD as PCP - General (Family Medicine) Sondra Come, MD as Consulting Physician (Urology) Jeralyn Ruths, MD as Consulting Physician (Oncology) Wyn Quaker Marlow Baars, MD as Referring Physician (Vascular Surgery)  Extended Emergency Contact Information Primary Emergency Contact: warner,[name] Mobile Phone: [phone] Relation: Daughter Interpreter needed? No Secondary Emergency Contact: [name],[name] Home Phone: [phone] Mobile Phone: [phone] Relation: Daughter  Code Status:  DNR  Goals of care: Advanced Directive information    09/13/2023    9:26 AM  Advanced Directives  Does Patient Have a Medical Advance Directive? Yes  Type of Estate agent of [place];Out of facility DNR (pink MOST or yellow form);Living will  Does patient want to make changes to medical advance directive? No - Patient declined  Copy of Healthcare Power of Attorney in Chart? Yes - validated most recent copy scanned in chart (See row information)     Chief Complaint  Patient presents with   Acute Visit    Back pain     HPI:  Pt is a 82 y.o. male seen today for an acute visit regarding back pains. He is a resident of [facility]. 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 He was reported to have left hand weakness. He is left-handed. He is currently having PT and OT for therapeutic strengthening exercises. He stated that he has  middle lower back pain sometimes and usually in the morning. He ambulates with a walker.  He stated that he is constipated, no BM for 5 days.    Past Medical History:  Diagnosis  Date   Basal cell carcinoma 10/02/2018   Left lat. chin. Nodular and infiltrative patterns.EDC.   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 Nodular. EDC   Basal cell carcinoma 08/26/2020   Right mid to inf. ear helix. Nodular, ulcerated. 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 Ulcerated.   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   L upper eyelid, exc and ED, pt had BCC of L upper eyelid and excision also included this SCC   Past Surgical History:  Procedure Laterality Date   BACK SURGERY     CATARACT EXTRACTION     HERNIA REPAIR     PORTA CATH INSERTION N/A 01/01/2019   Procedure: PORTA CATH INSERTION;  Surgeon: Annice Needy, MD;  Location: [facility] INVASIVE CV LAB;  Service: Cardiovascular;  Laterality: N/A;   TONSILLECTOMY     TRANSURETHRAL RESECTION OF BLADDER TUMOR N/A 12/08/2018   Procedure: TRANSURETHRAL RESECTION OF BLADDER TUMOR (TURBT);  Surgeon: Sondra Come, MD;  Location: [facility] ORS;  Service: Urology;  Laterality: N/A;    No Known Allergies  Outpatient Encounter Medications as of 11/04/2023  Medication Sig   atorvastatin (LIPITOR) 20 MG tablet Take 20 mg by mouth daily.   chlorhexidine (PERIDEX) 0.12 % solution Use as directed in the mouth or throat at bedtime. 0.5 ounce by mouth at bedtime for mouth care (swish for 30 seconds and spit after evening mouth care. DO NOT RINSE.)   magnesium chloride (SLOW-MAG) 64 MG TBEC SR tablet Take 1 tablet by mouth at bedtime.   metoprolol succinate  (TOPROL-XL) 25 MG 24 hr tablet Take 12.5 mg by mouth daily.   Multiple Vitamins-Minerals (CENTRUM MEN) TABS Take 1 tablet by mouth daily.   nystatin powder Apply to groin/affected areas topically as needed   polyethylene glycol (MIRALAX / GLYCOLAX) 17 g packet Take 17 g by mouth daily as needed.   Vitamin D, Ergocalciferol, (DRISDOL) 1.25 MG (50000 UT) CAPS capsule Take 50,000 Units by mouth every 7 (seven) days.   No facility-administered encounter medications on file as of 11/04/2023.    Review of Systems  Constitutional:  Negative for activity change, appetite change and fever.  HENT:  Negative for sore throat.   Eyes: Negative.   Cardiovascular:  Negative for chest pain and leg swelling.  Gastrointestinal:  Positive for constipation. Negative for abdominal distention, diarrhea and vomiting.  Genitourinary:  Negative for dysuria, frequency and urgency.  Musculoskeletal:  Positive for back pain.  Skin:  Negative for color change.  Neurological:  Negative for dizziness and headaches.  Psychiatric/Behavioral:  Negative for behavioral problems and sleep disturbance. The patient is not nervous/anxious.        Immunization History  Administered Date(s) Administered   Influenza Inj Mdck Quad Pf 08/25/2021, 08/31/2022   Influenza-Unspecified 08/27/2014, 09/03/2015, 08/31/2016, 08/29/2017   Moderna Covid-19 Fall Seasonal Vaccine 69yrs & older 03/08/2023   Moderna Sars-Covid-2 Vaccination 12/11/2019, 01/08/2020, 10/08/2020, 08/20/2021   Pneumococcal Conjugate-13 03/21/2014, 06/02/2017   Pneumococcal Polysaccharide-23 04/08/2016   Tdap 12/05/2017   Unspecified SARS-COV-2 Vaccination 08/26/2023   Pertinent  Health Maintenance Due  Topic Date Due   INFLUENZA VACCINE  06/30/2023      11/23/2022   10:33 PM 11/24/2022   10:00 PM 11/25/2022    8:30 PM 11/26/2022    9:00 AM 04/24/2023    9:06 PM  Fall Risk  Falls in the past year?     1  Was there an injury with Fall?     1  Fall Risk  Category Calculator     3  (RETIRED) Patient Fall Risk Level High fall risk High fall risk High fall risk High fall risk   Patient at Risk for Falls Due to     History of fall(s);Impaired balance/gait;Impaired mobility  Fall risk Follow up     Falls prevention discussed     Vitals:   11/04/23 1424  BP: (!) 149/70  Pulse: 78  Resp: 18  Temp: (!) 78 F (25.6 C)  SpO2: 98%  Weight: 180 lb (81.6 kg)  Height: 5\' 8"  (1.727 m)   Body mass index is 27.37 kg/m.  Physical Exam Constitutional:      Appearance: Normal appearance.  HENT:     Head: Normocephalic and atraumatic.     Mouth/Throat:     Mouth: Mucous membranes are moist.  Eyes:     Conjunctiva/sclera: Conjunctivae normal.  Cardiovascular:     Rate and Rhythm: Normal rate and regular rhythm.     Pulses: Normal pulses.     Heart sounds: Normal heart sounds.  Pulmonary:  Effort: Pulmonary effort is normal.     Breath sounds: Normal breath sounds.  Abdominal:     General: Bowel sounds are normal.     Palpations: Abdomen is soft.  Musculoskeletal:        General: No swelling.     Cervical back: Normal range of motion.  Skin:    General: Skin is warm and dry.  Neurological:     Mental Status: He is alert and oriented to person, place, and time.     Comments: Left-sided weakness  Psychiatric:        Mood and Affect: Mood normal.        Behavior: Behavior normal.        Thought Content: Thought content normal.        Judgment: Judgment normal.        Labs reviewed: Recent Labs    11/23/22 2249 11/25/22 0619 01/27/23 0000  NA 138 140 137  K 4.4 3.9 4.7  CL 105 109 103  CO2 22 24 30*  GLUCOSE 90 112*  --   BUN 25* 21 30*  CREATININE 1.21 1.04 1.2  CALCIUM 9.8 8.5* 8.9   Recent Labs    01/27/23 0000  AST 12*  ALT 11  ALKPHOS 91  ALBUMIN 3.3*   Recent Labs    11/23/22 2249 11/25/22 0619 01/27/23 0000  WBC 12.6* 13.9* 7.8  HGB 13.7 11.7* 12.1*  HCT 43.0 35.5* 36*  MCV 95.6 94.4  --   PLT  482* 433* 419*   No results found for: "TSH" No results found for: "HGBA1C" Lab Results  Component Value Date   CHOL 140 03/10/2023   HDL 43 03/10/2023   LDLCALC 82 03/10/2023   TRIG 71 03/10/2023    Significant Diagnostic Results in last 30 days:  No results found.  Assessment/Plan  1. 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 Slow transit constipation -No BM x 5 days -   Start MiraLAX daily  3. Benign hypertension with CKD (chronic kidney disease) stage III (HCC) -  BP 149/70 -   Continue metoprolol succinate   Family/ staff Communication: Discussed plan of care with resident and charge nurse.  Labs/tests ordered: None    Kenard Gower, DNP, MSN, FNP-BC [facility] [phone] (Monday-Friday 8:00 a.m. - 5:00 p.m.) [phone] (after hours)

## 2023-11-09 ENCOUNTER — Other Ambulatory Visit: Payer: Self-pay | Admitting: Orthopedic Surgery

## 2023-11-09 NOTE — Progress Notes (Unsigned)
Referring Physician:  No referring provider defined for this encounter.  Primary Physician:  Philip Mealing, MD  History of Present Illness: 11/09/2023 [name] has a history of HTN, CKD 3, bladder CA.   Last seen by me on 04/27/23 for L2 and L4 compression fractures when in ED on 11/24/22. He had no pain and was released to follow up prn.   Per facility notes, he is having left hand weakness along with intermittent mid to lower back pain. Thoracic xrays were done at facility and he is here for follow up.   He has history of back surgery?***       He was seen in ED on 11/24/22 and found to have L2 fracture and possible L4 fracture. Appears he was placed in LSO brace and lost to follow up.   He is here for evaluation.   He has no current back pain. No leg pain. No numbness, tingling, or weakness. Has not been wearing brace. He ambulates with a walker.   He thinks he has history of cervical surgery years ago.   He has osteomy bag s/p bladder CA. No bowel issues.   Review of Systems:  A 10 point review of systems is negative, except for the pertinent positives and negatives detailed in the HPI.  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 chin. Nodular and infiltrative patterns.EDC.   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 Nodular. EDC   Basal cell carcinoma 08/26/2020   Right mid to inf. ear helix. Nodular, ulcerated. 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 Ulcerated.   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   L upper eyelid, exc and ED, pt had BCC of L upper eyelid and excision also included this SCC    Past Surgical History: Past Surgical History:  Procedure Laterality Date   BACK SURGERY     CATARACT EXTRACTION     HERNIA REPAIR     PORTA CATH INSERTION N/A 01/01/2019   Procedure: PORTA CATH INSERTION;  Surgeon: Annice Needy, MD;  Location: [facility] INVASIVE CV LAB;  Service: Cardiovascular;  Laterality: N/A;   TONSILLECTOMY     TRANSURETHRAL RESECTION OF BLADDER TUMOR N/A 12/08/2018   Procedure: TRANSURETHRAL RESECTION OF BLADDER TUMOR (TURBT);  Surgeon: Sondra Come, MD;  Location: [facility] ORS;  Service: Urology;  Laterality: N/A;    Allergies: Allergies as of 11/10/2023   (No Known Allergies)    Medications: Outpatient Encounter Medications as of 11/10/2023  Medication Sig   atorvastatin (LIPITOR) 20 MG tablet Take 20 mg by mouth daily.   chlorhexidine (PERIDEX) 0.12 % solution Use as directed in the mouth or throat at bedtime. 0.5 ounce by mouth at bedtime for mouth care (swish for 30 seconds and spit after evening mouth care. DO NOT RINSE.)   magnesium chloride (SLOW-MAG) 64 MG TBEC SR tablet Take 1 tablet  by mouth at bedtime.   metoprolol succinate (TOPROL-XL) 25 MG 24 hr tablet Take 12.5 mg by mouth daily.   Multiple Vitamins-Minerals (CENTRUM MEN) TABS Take 1 tablet by mouth daily.   nystatin powder Apply to groin/affected areas topically as needed   polyethylene glycol (MIRALAX / GLYCOLAX) 17 g packet Take 17 g by mouth daily as needed.   Vitamin D, Ergocalciferol, (DRISDOL) 1.25 MG (50000 UT) CAPS capsule Take 50,000 Units by mouth every 7 (seven) days.   No facility-administered encounter medications on file as of 11/10/2023.    Social History: Social History   Tobacco Use   Smoking status: Former    Current packs/day: 0.00    Types: Cigarettes    Start date:  11/29/1958    Quit date: 11/29/1993    Years since quitting: 29.9   Smokeless tobacco: Never  Vaping Use   Vaping status: Never Used  Substance Use Topics   Alcohol use: Yes    Comment: a couple of drinks a day all the above   Drug use: Never    Family Medical History: Family History  Problem Relation Age of Onset   Heart failure Mother     Physical Examination: There were no vitals filed for this visit.    Awake, alert, oriented to person, place, and time.  Speech is clear and fluent. Fund of knowledge is appropriate.   Cranial Nerves: Pupils equal round and reactive to light.  Facial tone is symmetric.    No posterior lumbar tenderness.   No abnormal lesions on exposed skin.   Strength: Side Biceps Triceps Deltoid Interossei Grip Wrist Ext. Wrist Flex.  R 5 5 5 5 5 5 5   L 5 5 5 5 5 5 5    Side Iliopsoas Quads Hamstring PF DF EHL  R 5 5 5 5 5 5   L 5 5 5 5 5 5    Reflexes are 1+ and symmetric at the biceps, triceps, brachioradialis, patella and achilles.    Clonus is not present.   Bilateral upper and lower extremity sensation is intact to light touch.     Gait not tested. He is in a WC.    Medical Decision Making  Imaging: ***  Assessment and Plan: [name] is a pleasant 82 y.o. male has L2 and L4 compression fractures when in ED on 11/24/22.   He has no current back pain. No leg pain. No numbness, tingling, or weakness.   He has some progression of L4 fracture from initial films. L2 fracture is stable.   Treatment options discussed with patient and following plan made:   - He had some progression of L4 fracture since xrays in December, but clinically he looks good.  - He does not need to wear back brace.  - Avoid bending, twisting, and lifting as able.  - He can follow up prn.   I spent a total of 25 minutes in face-to-face and non-face-to-face activities related to this patient's care today including review of outside records, review of imaging, review of  symptoms, physical exam, discussion of differential diagnosis, discussion of treatment options, and documentation.   Thank you for involving me in the care of this patient.   [name] PA-C Dept. of Neurosurgery

## 2023-11-10 ENCOUNTER — Encounter: Payer: Self-pay | Admitting: Nurse Practitioner

## 2023-11-10 ENCOUNTER — Ambulatory Visit (INDEPENDENT_AMBULATORY_CARE_PROVIDER_SITE_OTHER): Payer: Medicare Other | Admitting: Orthopedic Surgery

## 2023-11-10 ENCOUNTER — Non-Acute Institutional Stay (SKILLED_NURSING_FACILITY): Payer: Medicare Other | Admitting: Nurse Practitioner

## 2023-11-10 ENCOUNTER — Encounter: Payer: Self-pay | Admitting: Orthopedic Surgery

## 2023-11-10 VITALS — BP 118/62 | Ht 68.0 in | Wt 180.0 lb

## 2023-11-10 DIAGNOSIS — R5383 Other fatigue: Secondary | ICD-10-CM

## 2023-11-10 DIAGNOSIS — M47816 Spondylosis without myelopathy or radiculopathy, lumbar region: Secondary | ICD-10-CM | POA: Diagnosis not present

## 2023-11-10 DIAGNOSIS — S32040D Wedge compression fracture of fourth lumbar vertebra, subsequent encounter for fracture with routine healing: Secondary | ICD-10-CM | POA: Diagnosis not present

## 2023-11-10 DIAGNOSIS — S32020S Wedge compression fracture of second lumbar vertebra, sequela: Secondary | ICD-10-CM

## 2023-11-10 DIAGNOSIS — S32040S Wedge compression fracture of fourth lumbar vertebra, sequela: Secondary | ICD-10-CM

## 2023-11-10 NOTE — Progress Notes (Signed)
Location:  Other [facility]  Nursing Home Room Number: [facility] [address] Place of Service:  SNF [phone]) Abbey Chatters, NP  PCP: Earnestine Mealing, MD  Patient Care Team: Earnestine Mealing, MD as PCP - General (Family Medicine) Sondra Come, MD as Consulting Physician (Urology) Orlie Dakin Tollie Pizza, MD as Consulting Physician (Oncology) Wyn Quaker Marlow Baars, MD as Referring Physician (Vascular Surgery)  Extended Emergency Contact Information Primary Emergency Contact: [name],[name] Home Phone: [phone] Mobile Phone: [phone] Relation: Daughter Secondary Emergency Contact: warner,[name] Mobile Phone: [phone] Relation: Daughter Interpreter needed? No  Goals of care: Advanced Directive information    11/10/2023    4:08 PM  Advanced Directives  Does Patient Have a Medical Advance Directive? Yes  Type of Estate agent of [place];Out of facility DNR (pink MOST or yellow form);Living will  Does patient want to make changes to medical advance directive? No - Patient declined  Copy of Healthcare Power of Attorney in Chart? Yes - validated most recent copy scanned in chart (See row information)     Chief Complaint  Patient presents with   Acute Visit    Nasal and Chest Congestion.     HPI:  Pt is a 82 y.o. male seen today for an acute visit for nasal and chest congestion. Staff reports today he was slow to wake up and slow to process information.  Could not help get himself ready.  Did not eat much for breakfast and lunch. Denies nausea, vomiting, constipation or diarrhea Denies cough, congestion or shortness of breath but staff noted congestion.  He is A&o x 4 He is slower to answer questions today Went to neurosurgery appt today and was confused about several things regarding appt.  Covid test negative.    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 chin. Nodular and infiltrative patterns.EDC.    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 Nodular. EDC   Basal cell carcinoma 08/26/2020   Right mid to inf. ear helix. Nodular, ulcerated. 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 Ulcerated.   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   L upper eyelid, exc and ED, pt had BCC of L upper eyelid and excision also included this SCC   Past Surgical History:  Procedure Laterality Date   BACK SURGERY     CATARACT EXTRACTION     HERNIA REPAIR     PORTA CATH INSERTION N/A 01/01/2019   Procedure: PORTA CATH INSERTION;  Surgeon: Annice Needy, MD;  Location: [facility] INVASIVE CV LAB;  Service: Cardiovascular;  Laterality: N/A;   TONSILLECTOMY     TRANSURETHRAL RESECTION OF BLADDER TUMOR N/A 12/08/2018   Procedure: TRANSURETHRAL RESECTION OF BLADDER TUMOR (TURBT);  Surgeon: Sondra Come, MD;  Location: [facility] ORS;  Service: Urology;  Laterality: N/A;    No Known Allergies  Outpatient Encounter Medications as of 11/10/2023  Medication Sig   atorvastatin (LIPITOR) 20 MG  tablet Take 20 mg by mouth daily.   chlorhexidine (PERIDEX) 0.12 % solution Use as directed in the mouth or throat at bedtime. 0.5 ounce by mouth at bedtime for mouth care (swish for 30 seconds and spit after evening mouth care. DO NOT RINSE.)   dextromethorphan-guaiFENesin (MUCINEX DM) 30-600 MG 12hr tablet Take 1 tablet by mouth 2 (two) times daily.   lidocaine 4 % Place 1 patch onto the skin every 12 (twelve) hours as needed.   lisinopril (ZESTRIL)  10 MG tablet Take 10 mg by mouth daily.   magnesium chloride (SLOW-MAG) 64 MG TBEC SR tablet Take 1 tablet by mouth at bedtime.   metoprolol succinate (TOPROL-XL) 25 MG 24 hr tablet Take 12.5 mg by mouth daily.   Multiple Vitamins-Minerals (CENTRUM MEN) TABS Take 1 tablet by mouth daily.   nystatin powder Apply to groin/affected areas topically as needed   ondansetron (ZOFRAN) 4 MG tablet Take 4 mg by mouth every 8 (eight) hours as needed for nausea or vomiting.   polyethylene glycol (MIRALAX / GLYCOLAX) 17 g packet Take 17 g by mouth daily as needed.   Vitamin D, Ergocalciferol, (DRISDOL) 1.25 MG (50000 UT) CAPS capsule Take 50,000 Units by mouth every 7 (seven) days.   No facility-administered encounter medications on file as of 11/10/2023.    Review of Systems  Constitutional:  Negative for activity change, appetite change, fatigue and unexpected weight change.  HENT:  Negative for congestion, hearing loss, rhinorrhea, sinus pressure and sinus pain.   Eyes: Negative.   Respiratory:  Negative for cough and shortness of breath.   Cardiovascular:  Negative for chest pain, palpitations and leg swelling.  Gastrointestinal:  Negative for abdominal pain, constipation and diarrhea.  Genitourinary:  Negative for difficulty urinating and dysuria.  Musculoskeletal:  Negative for arthralgias and myalgias.  Skin:  Negative for color change and wound.  Neurological:  Negative for dizziness and weakness.  Psychiatric/Behavioral:  Negative for agitation, behavioral problems and confusion.     Immunization History  Administered Date(s) Administered   Influenza Inj Mdck Quad Pf 08/25/2021, 08/31/2022   Influenza-Unspecified 08/27/2014, 09/03/2015, 08/31/2016, 08/29/2017, 09/21/2023   Moderna Covid-19 Fall Seasonal Vaccine 39yrs & older 03/08/2023   Moderna Sars-Covid-2 Vaccination 12/11/2019, 01/08/2020, 10/08/2020, 08/20/2021   Pneumococcal Conjugate-13 03/21/2014, 06/02/2017   Pneumococcal  Polysaccharide-23 04/08/2016   Tdap 12/05/2017   Unspecified SARS-COV-2 Vaccination 08/26/2023   Pertinent  Health Maintenance Due  Topic Date Due   INFLUENZA VACCINE  Completed      11/23/2022   10:33 PM 11/24/2022   10:00 PM 11/25/2022    8:30 PM 11/26/2022    9:00 AM 04/24/2023    9:06 PM  Fall Risk  Falls in the past year?     1  Was there an injury with Fall?     1  Fall Risk Category Calculator     3  (RETIRED) Patient Fall Risk Level High fall risk High fall risk High fall risk High fall risk   Patient at Risk for Falls Due to     History of fall(s);Impaired balance/gait;Impaired mobility  Fall risk Follow up     Falls prevention discussed   Functional Status Survey:    Vitals:   11/10/23 1600  BP: 134/83  Pulse: 88  Resp: 16  Temp: (!) 97.5 F (36.4 C)  SpO2: 95%  Weight: 180 lb (81.6 kg)  Height: 5\' 8"  (1.727 m)   Body mass index is 27.37 kg/m. Physical Exam Constitutional:  General: He is not in acute distress.    Appearance: He is well-developed. He is not diaphoretic.  HENT:     Head: Normocephalic and atraumatic.     Right Ear: External ear normal.     Left Ear: External ear normal.     Mouth/Throat:     Pharynx: No oropharyngeal exudate.  Eyes:     Conjunctiva/sclera: Conjunctivae normal.     Pupils: Pupils are equal, round, and reactive to light.  Cardiovascular:     Rate and Rhythm: Normal rate and regular rhythm.     Heart sounds: Normal heart sounds.  Pulmonary:     Effort: Pulmonary effort is normal.     Breath sounds: Normal breath sounds.  Abdominal:     General: Bowel sounds are normal.     Palpations: Abdomen is soft.  Musculoskeletal:        General: No tenderness.     Cervical back: Normal range of motion and neck supple.     Right lower leg: No edema.     Left lower leg: No edema.  Skin:    General: Skin is warm and dry.  Neurological:     Mental Status: He is alert and oriented to person, place, and time.     Labs  reviewed: Recent Labs    11/23/22 2249 11/25/22 0619 01/27/23 0000 09/29/23 0000  NA 138 140 137 136*  K 4.4 3.9 4.7 4.5  CL 105 109 103 99  CO2 22 24 30* 27*  GLUCOSE 90 112*  --   --   BUN 25* 21 30* 25*  CREATININE 1.21 1.04 1.2 1.3  CALCIUM 9.8 8.5* 8.9 9.3   Recent Labs    01/27/23 0000 09/29/23 0000  AST 12* 17  ALT 11 19  ALKPHOS 91 95  ALBUMIN 3.3* 3.6   Recent Labs    11/23/22 2249 11/25/22 0619 01/27/23 0000 09/29/23 0000  WBC 12.6* 13.9* 7.8 8.9  HGB 13.7 11.7* 12.1* 14.1  HCT 43.0 35.5* 36* 43  MCV 95.6 94.4  --   --   PLT 482* 433* 419* 470*   No results found for: "TSH" No results found for: "HGBA1C" Lab Results  Component Value Date   CHOL 140 03/10/2023   HDL 43 03/10/2023   LDLCALC 82 03/10/2023   TRIG 71 03/10/2023    Significant Diagnostic Results in last 30 days:  No results found.  Assessment/Plan 1. Other fatigue (Primary) Staff noted nasal and chest congestion but patient denies.  No fevers or chills noted.  Will get cbc with diff and BMP -can use mucinex DM by mouth twice daily with full glass of water  Vs q shift and will have staff closely monitor.    Janene Harvey. [facility] [phone]

## 2023-11-11 ENCOUNTER — Encounter: Payer: Self-pay | Admitting: Student

## 2023-11-11 ENCOUNTER — Non-Acute Institutional Stay (SKILLED_NURSING_FACILITY): Payer: Self-pay | Admitting: Student

## 2023-11-11 DIAGNOSIS — R410 Disorientation, unspecified: Secondary | ICD-10-CM | POA: Diagnosis not present

## 2023-11-11 DIAGNOSIS — R531 Weakness: Secondary | ICD-10-CM

## 2023-11-11 NOTE — Progress Notes (Unsigned)
Location:  Other [place].  Nursing Home Room Number: [facility] Place of Service:  SNF [phone]) Provider:  Earnestine Mealing, MD  Patient Care Team: Earnestine Mealing, MD as PCP - General (Family Medicine) Sondra Come, MD as Consulting Physician (Urology) Jeralyn Ruths, MD as Consulting Physician (Oncology) Wyn Quaker Marlow Baars, MD as Referring Physician (Vascular Surgery)  Extended Emergency Contact Information Primary Emergency Contact: [name],[name] Home Phone: [phone] Mobile Phone: [phone] Relation: Daughter Secondary Emergency Contact: warner,[name] Mobile Phone: [phone] Relation: Daughter Interpreter needed? No  Code Status:  DNR Goals of care: Advanced Directive information    11/11/2023   11:03 AM  Advanced Directives  Does Patient Have a Medical Advance Directive? Yes  Type of Estate agent of [place];Living will;Out of facility DNR (pink MOST or yellow form)  Does patient want to make changes to medical advance directive? No - Patient declined  Copy of Healthcare Power of Attorney in Chart? Yes - validated most recent copy scanned in chart (See row information)     Chief Complaint  Patient presents with   Acute Visit    Changes in Mental Status    HPI:  Pt is a 82 y.o. male seen today for an acute visit for Changes in Mental Status. Patient has had periodic confusion and weakness over the last 3 weeks. Contacted patient's daughter and she has noticed the change as well. Unclear cause. No clear symptomology. Patient resting peacefully in bed.    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 chin. Nodular and infiltrative patterns.EDC.   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 Nodular. EDC   Basal cell carcinoma 08/26/2020   Right mid to inf. ear helix. Nodular, ulcerated. 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 Ulcerated.   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   L upper eyelid, exc and ED, pt had BCC of L upper eyelid and excision also included this SCC   Past Surgical History:  Procedure Laterality Date   BACK SURGERY     CATARACT EXTRACTION     HERNIA REPAIR     PORTA CATH INSERTION N/A 01/01/2019   Procedure: PORTA CATH INSERTION;  Surgeon: Annice Needy, MD;  Location: [facility] INVASIVE CV LAB;  Service: Cardiovascular;  Laterality: N/A;   TONSILLECTOMY     TRANSURETHRAL RESECTION OF BLADDER TUMOR N/A 12/08/2018   Procedure: TRANSURETHRAL RESECTION OF BLADDER TUMOR (TURBT);  Surgeon: Sondra Come, MD;  Location: [facility] ORS;  Service: Urology;  Laterality: N/A;    No Known Allergies  Outpatient Encounter Medications as of 11/11/2023  Medication Sig   atorvastatin (LIPITOR) 20 MG tablet Take 20 mg by mouth daily.   chlorhexidine (PERIDEX) 0.12 % solution Use as directed in the mouth or throat at bedtime. 0.5 ounce by mouth at bedtime for mouth care (swish for 30 seconds and spit after evening mouth care. DO NOT  RINSE.)   dextromethorphan-guaiFENesin (MUCINEX DM) 30-600 MG 12hr tablet Take 1 tablet by mouth 2 (two) times daily.   lidocaine 4 % Place 1 patch onto the skin every 12 (twelve) hours as needed.   lisinopril (ZESTRIL) 10 MG tablet Take 10 mg by mouth daily.   magnesium chloride (SLOW-MAG) 64 MG TBEC SR tablet Take 1 tablet by mouth at bedtime.   metoprolol succinate (TOPROL-XL) 25 MG 24 hr tablet Take 12.5 mg by mouth daily.   Multiple Vitamins-Minerals  (CENTRUM MEN) TABS Take 1 tablet by mouth daily.   nystatin powder Apply to groin/affected areas topically as needed   ondansetron (ZOFRAN) 4 MG tablet Take 4 mg by mouth every 8 (eight) hours as needed for nausea or vomiting.   polyethylene glycol (MIRALAX / GLYCOLAX) 17 g packet Take 17 g by mouth daily as needed.   Vitamin D, Ergocalciferol, (DRISDOL) 1.25 MG (50000 UT) CAPS capsule Take 50,000 Units by mouth every 7 (seven) days.   No facility-administered encounter medications on file as of 11/11/2023.    Review of Systems  Immunization History  Administered Date(s) Administered   Influenza Inj Mdck Quad Pf 08/25/2021, 08/31/2022   Influenza-Unspecified 08/27/2014, 09/03/2015, 08/31/2016, 08/29/2017, 09/21/2023   Moderna Covid-19 Fall Seasonal Vaccine 8yrs & older 03/08/2023   Moderna Sars-Covid-2 Vaccination 12/11/2019, 01/08/2020, 10/08/2020, 08/20/2021   Pneumococcal Conjugate-13 03/21/2014, 06/02/2017   Pneumococcal Polysaccharide-23 04/08/2016   Tdap 12/05/2017   Unspecified SARS-COV-2 Vaccination 08/26/2023   Pertinent  Health Maintenance Due  Topic Date Due   INFLUENZA VACCINE  Completed      11/23/2022   10:33 PM 11/24/2022   10:00 PM 11/25/2022    8:30 PM 11/26/2022    9:00 AM 04/24/2023    9:06 PM  Fall Risk  Falls in the past year?     1  Was there an injury with Fall?     1  Fall Risk Category Calculator     3  (RETIRED) Patient Fall Risk Level High fall risk High fall risk High fall risk High fall risk   Patient at Risk for Falls Due to     History of fall(s);Impaired balance/gait;Impaired mobility  Fall risk Follow up     Falls prevention discussed   Functional Status Survey:    Vitals:   11/11/23 1059  BP: (!) 140/72  Pulse: 76  Resp: 18  Temp: (!) 97.5 F (36.4 C)  SpO2: 95%  Weight: 180 lb (81.6 kg)  Height: 5\' 8"  (1.727 m)   Body mass index is 27.37 kg/m. Physical Exam Constitutional:      Comments: Sleeping in bed.   Cardiovascular:      Rate and Rhythm: Normal rate.     Pulses: Normal pulses.  Pulmonary:     Effort: Pulmonary effort is normal.  Abdominal:     General: Abdomen is flat.     Palpations: Abdomen is soft.     Labs reviewed: Recent Labs    11/23/22 2249 11/25/22 0619 01/27/23 0000 09/29/23 0000  NA 138 140 137 136*  K 4.4 3.9 4.7 4.5  CL 105 109 103 99  CO2 22 24 30* 27*  GLUCOSE 90 112*  --   --   BUN 25* 21 30* 25*  CREATININE 1.21 1.04 1.2 1.3  CALCIUM 9.8 8.5* 8.9 9.3   Recent Labs    01/27/23 0000 09/29/23 0000  AST 12* 17  ALT 11 19  ALKPHOS 91 95  ALBUMIN 3.3* 3.6   Recent Labs  11/23/22 2249 11/25/22 0619 01/27/23 0000 09/29/23 0000  WBC 12.6* 13.9* 7.8 8.9  HGB 13.7 11.7* 12.1* 14.1  HCT 43.0 35.5* 36* 43  MCV 95.6 94.4  --   --   PLT 482* 433* 419* 470*   No results found for: "TSH" No results found for: "HGBA1C" Lab Results  Component Value Date   CHOL 140 03/10/2023   HDL 43 03/10/2023   LDLCALC 82 03/10/2023   TRIG 71 03/10/2023    Significant Diagnostic Results in last 30 days:  No results found.  Assessment/Plan Left-sided weakness - Plan: CT HEAD WO CONTRAST ( )  Confusion - Plan: CT HEAD WO CONTRAST ( ) Patient with continued changes in mental status and awareness. VSS. BP improving. UA and culture as well as outpatient CT scan given >2 weeks of symptoms.   Family/ staff Communication: nursing, HCPOA  Labs/tests ordered:  UA/CS

## 2023-11-12 ENCOUNTER — Encounter: Payer: Self-pay | Admitting: Student

## 2023-11-14 ENCOUNTER — Other Ambulatory Visit: Payer: Self-pay | Admitting: Student

## 2023-11-14 ENCOUNTER — Ambulatory Visit
Admission: RE | Admit: 2023-11-14 | Discharge: 2023-11-14 | Disposition: A | Discharge status: Home / Self Care | Payer: Medicare Other | Source: Ambulatory Visit | Attending: Student | Admitting: Student

## 2023-11-14 DIAGNOSIS — R410 Disorientation, unspecified: Secondary | ICD-10-CM | POA: Insufficient documentation

## 2023-11-14 DIAGNOSIS — R531 Weakness: Secondary | ICD-10-CM | POA: Insufficient documentation

## 2023-11-14 NOTE — Progress Notes (Signed)
Patient CT concerning for an occipital stroke age indeterminate. Follow up imaging.

## 2023-11-15 ENCOUNTER — Ambulatory Visit
Admission: RE | Admit: 2023-11-15 | Discharge: 2023-11-15 | Discharge status: Home / Self Care | Payer: Medicare Other | Source: Ambulatory Visit | Attending: Student | Admitting: Student

## 2023-11-15 ENCOUNTER — Inpatient Hospital Stay: Payer: Medicare Other

## 2023-11-15 ENCOUNTER — Other Ambulatory Visit: Payer: Self-pay

## 2023-11-15 ENCOUNTER — Inpatient Hospital Stay
Admission: EM | Admit: 2023-11-15 | Discharge: 2023-11-18 | DRG: 065 | Disposition: A | Discharge status: Skilled Nursing Facility | Payer: Medicare Other | Source: Skilled Nursing Facility | Attending: Internal Medicine | Admitting: Internal Medicine

## 2023-11-15 DIAGNOSIS — R29702 NIHSS score 2: Secondary | ICD-10-CM | POA: Diagnosis present

## 2023-11-15 DIAGNOSIS — Z85828 Personal history of other malignant neoplasm of skin: Secondary | ICD-10-CM

## 2023-11-15 DIAGNOSIS — G939 Disorder of brain, unspecified: Secondary | ICD-10-CM | POA: Diagnosis not present

## 2023-11-15 DIAGNOSIS — Z87891 Personal history of nicotine dependence: Secondary | ICD-10-CM | POA: Diagnosis not present

## 2023-11-15 DIAGNOSIS — E7800 Pure hypercholesterolemia, unspecified: Secondary | ICD-10-CM | POA: Diagnosis present

## 2023-11-15 DIAGNOSIS — Z8249 Family history of ischemic heart disease and other diseases of the circulatory system: Secondary | ICD-10-CM | POA: Diagnosis not present

## 2023-11-15 DIAGNOSIS — R531 Weakness: Secondary | ICD-10-CM | POA: Insufficient documentation

## 2023-11-15 DIAGNOSIS — Z8551 Personal history of malignant neoplasm of bladder: Secondary | ICD-10-CM | POA: Diagnosis not present

## 2023-11-15 DIAGNOSIS — I129 Hypertensive chronic kidney disease with stage 1 through stage 4 chronic kidney disease, or unspecified chronic kidney disease: Secondary | ICD-10-CM | POA: Diagnosis present

## 2023-11-15 DIAGNOSIS — I639 Cerebral infarction, unspecified: Principal | ICD-10-CM

## 2023-11-15 DIAGNOSIS — D473 Essential (hemorrhagic) thrombocythemia: Secondary | ICD-10-CM

## 2023-11-15 DIAGNOSIS — Z66 Do not resuscitate: Secondary | ICD-10-CM | POA: Diagnosis present

## 2023-11-15 DIAGNOSIS — Z7982 Long term (current) use of aspirin: Secondary | ICD-10-CM | POA: Diagnosis not present

## 2023-11-15 DIAGNOSIS — I10 Essential (primary) hypertension: Secondary | ICD-10-CM | POA: Diagnosis present

## 2023-11-15 DIAGNOSIS — I6340 Cerebral infarction due to embolism of unspecified cerebral artery: Principal | ICD-10-CM | POA: Diagnosis present

## 2023-11-15 DIAGNOSIS — R278 Other lack of coordination: Secondary | ICD-10-CM | POA: Diagnosis present

## 2023-11-15 DIAGNOSIS — D75839 Thrombocytosis, unspecified: Secondary | ICD-10-CM | POA: Diagnosis present

## 2023-11-15 DIAGNOSIS — N1831 Chronic kidney disease, stage 3a: Secondary | ICD-10-CM | POA: Diagnosis present

## 2023-11-15 DIAGNOSIS — I6389 Other cerebral infarction: Secondary | ICD-10-CM | POA: Diagnosis not present

## 2023-11-15 DIAGNOSIS — G8192 Hemiplegia, unspecified affecting left dominant side: Secondary | ICD-10-CM | POA: Diagnosis present

## 2023-11-15 DIAGNOSIS — H9190 Unspecified hearing loss, unspecified ear: Secondary | ICD-10-CM | POA: Diagnosis present

## 2023-11-15 DIAGNOSIS — Z79899 Other long term (current) drug therapy: Secondary | ICD-10-CM | POA: Diagnosis not present

## 2023-11-15 DIAGNOSIS — R569 Unspecified convulsions: Secondary | ICD-10-CM | POA: Diagnosis not present

## 2023-11-15 DIAGNOSIS — I440 Atrioventricular block, first degree: Secondary | ICD-10-CM

## 2023-11-15 DIAGNOSIS — Z9221 Personal history of antineoplastic chemotherapy: Secondary | ICD-10-CM | POA: Diagnosis not present

## 2023-11-15 LAB — COMPREHENSIVE METABOLIC PANEL
ALT: 25 U/L (ref 0–44)
AST: 24 U/L (ref 15–41)
Albumin: 3.5 g/dL (ref 3.5–5.0)
Alkaline Phosphatase: 112 U/L (ref 38–126)
Anion gap: 10 (ref 5–15)
BUN: 22 mg/dL (ref 8–23)
CO2: 25 mmol/L (ref 22–32)
Calcium: 9.6 mg/dL (ref 8.9–10.3)
Chloride: 101 mmol/L (ref 98–111)
Creatinine, Ser: 1.3 mg/dL — ABNORMAL HIGH (ref 0.61–1.24)
GFR, Estimated: 54 mL/min — ABNORMAL LOW (ref >60)
Glucose, Bld: 107 mg/dL — ABNORMAL HIGH (ref 70–99)
Potassium: 4.8 mmol/L (ref 3.5–5.1)
Sodium: 136 mmol/L (ref 135–145)
Total Bilirubin: 0.8 mg/dL (ref <1.2)
Total Protein: 7.5 g/dL (ref 6.5–8.1)

## 2023-11-15 LAB — LIPID PANEL
Cholesterol: 148 mg/dL (ref 0–200)
HDL: 42 mg/dL (ref >40)
LDL Cholesterol: 90 mg/dL (ref 0–99)
Total CHOL/HDL Ratio: 3.5 {ratio}
Triglycerides: 79 mg/dL (ref <150)
VLDL: 16 mg/dL (ref 0–40)

## 2023-11-15 LAB — CBC
HCT: 46.2 % (ref 39.0–52.0)
Hemoglobin: 14.9 g/dL (ref 13.0–17.0)
MCH: 30 pg (ref 26.0–34.0)
MCHC: 32.3 g/dL (ref 30.0–36.0)
MCV: 93 fL (ref 80.0–100.0)
Platelets: 482 10*3/uL — ABNORMAL HIGH (ref 150–400)
RBC: 4.97 MIL/uL (ref 4.22–5.81)
RDW: 14.5 % (ref 11.5–15.5)
WBC: 9.6 10*3/uL (ref 4.0–10.5)
nRBC: 0 % (ref 0.0–0.2)

## 2023-11-15 LAB — DIFFERENTIAL
Abs Immature Granulocytes: 0.1 10*3/uL — ABNORMAL HIGH (ref 0.00–0.07)
Basophils Absolute: 0.1 10*3/uL (ref 0.0–0.1)
Basophils Relative: 1 %
Eosinophils Absolute: 0.8 10*3/uL — ABNORMAL HIGH (ref 0.0–0.5)
Eosinophils Relative: 8 %
Immature Granulocytes: 1 %
Lymphocytes Relative: 12 %
Lymphs Abs: 1.2 10*3/uL (ref 0.7–4.0)
Monocytes Absolute: 0.7 10*3/uL (ref 0.1–1.0)
Monocytes Relative: 7 %
Neutro Abs: 6.8 10*3/uL (ref 1.7–7.7)
Neutrophils Relative %: 71 %

## 2023-11-15 LAB — PROTIME-INR
INR: 1 (ref 0.8–1.2)
Prothrombin Time: 13.3 s (ref 11.4–15.2)

## 2023-11-15 LAB — HEMOGLOBIN A1C
Hgb A1c MFr Bld: 5.6 % (ref 4.8–5.6)
Mean Plasma Glucose: 114.02 mg/dL

## 2023-11-15 LAB — ETHANOL: Alcohol, Ethyl (B): <10 mg/dL (ref <10)

## 2023-11-15 LAB — APTT: aPTT: 26 s (ref 24–36)

## 2023-11-15 MED ORDER — GADOBUTROL 1 MMOL/ML IV SOLN
7.5000 mL | Freq: Once | INTRAVENOUS | Status: AC | PRN
  Administered 2023-11-15: 7.5 mL via INTRAVENOUS

## 2023-11-15 MED ORDER — ASPIRIN 325 MG PO TABS
325.0000 mg | ORAL_TABLET | Freq: Every day | ORAL | Status: DC
  Administered 2023-11-15: 325 mg via ORAL
  Filled 2023-11-15 (×2): qty 1

## 2023-11-15 MED ORDER — DM-GUAIFENESIN ER 30-600 MG PO TB12: 1.0000 | ORAL_TABLET | Freq: Two times a day (BID) | ORAL | Status: DC | PRN

## 2023-11-15 MED ORDER — STROKE: EARLY STAGES OF RECOVERY BOOK: Freq: Once | Status: AC

## 2023-11-15 MED ORDER — LISINOPRIL 10 MG PO TABS
10.0000 mg | ORAL_TABLET | Freq: Every day | ORAL | Status: DC
  Administered 2023-11-16 – 2023-11-18 (×3): 10 mg via ORAL
  Filled 2023-11-15 (×3): qty 1

## 2023-11-15 MED ORDER — MAGNESIUM CHLORIDE 64 MG PO TBEC
1.0000 | DELAYED_RELEASE_TABLET | Freq: Every day | ORAL | Status: DC
  Administered 2023-11-15 – 2023-11-17 (×3): 64 mg via ORAL
  Filled 2023-11-15 (×4): qty 1

## 2023-11-15 MED ORDER — IOHEXOL 350 MG/ML SOLN
100.0000 mL | Freq: Once | INTRAVENOUS | Status: AC | PRN
  Administered 2023-11-15: 100 mL via INTRAVENOUS

## 2023-11-15 MED ORDER — ENOXAPARIN SODIUM 40 MG/0.4ML IJ SOSY
40.0000 mg | PREFILLED_SYRINGE | INTRAMUSCULAR | Status: DC
  Administered 2023-11-15 – 2023-11-17 (×3): 40 mg via SUBCUTANEOUS
  Filled 2023-11-15 (×3): qty 0.4

## 2023-11-15 MED ORDER — LIDOCAINE 5 % EX PTCH
1.0000 | MEDICATED_PATCH | CUTANEOUS | Status: DC
  Administered 2023-11-16 – 2023-11-17 (×3): 1 via TRANSDERMAL
  Filled 2023-11-15 (×4): qty 1

## 2023-11-15 MED ORDER — TRAMADOL HCL 50 MG PO TABS
50.0000 mg | ORAL_TABLET | Freq: Four times a day (QID) | ORAL | Status: DC | PRN
  Administered 2023-11-15: 50 mg via ORAL
  Filled 2023-11-15: qty 1

## 2023-11-15 MED ORDER — ACETAMINOPHEN 325 MG PO TABS: 650.0000 mg | ORAL_TABLET | ORAL | Status: DC | PRN

## 2023-11-15 MED ORDER — ACETAMINOPHEN 325 MG PO TABS
650.0000 mg | ORAL_TABLET | Freq: Four times a day (QID) | ORAL | Status: DC
  Administered 2023-11-15 – 2023-11-18 (×8): 650 mg via ORAL
  Filled 2023-11-15 (×8): qty 2

## 2023-11-15 MED ORDER — ACETAMINOPHEN 160 MG/5ML PO SOLN: 650.0000 mg | ORAL | Status: DC | PRN

## 2023-11-15 MED ORDER — ACETAMINOPHEN 325 MG RE SUPP: 650.0000 mg | RECTAL | Status: DC | PRN

## 2023-11-15 MED ORDER — ATORVASTATIN CALCIUM 20 MG PO TABS
40.0000 mg | ORAL_TABLET | Freq: Every day | ORAL | Status: DC
  Administered 2023-11-15: 40 mg via ORAL
  Filled 2023-11-15: qty 2

## 2023-11-15 MED ORDER — SENNOSIDES-DOCUSATE SODIUM 8.6-50 MG PO TABS
1.0000 | ORAL_TABLET | Freq: Every evening | ORAL | Status: DC | PRN
Start: 2023-11-15 — End: 2023-11-18

## 2023-11-15 NOTE — Assessment & Plan Note (Signed)
-   Hold home antihypertensives in the setting of permissive hypertension

## 2023-11-15 NOTE — Assessment & Plan Note (Signed)
-   Increase home Lipitor to high intensity 40 mg - Lipid panel pending

## 2023-11-15 NOTE — Assessment & Plan Note (Signed)
History of CKD stage IIIa with baseline creatinine ranging between 1.2 and 1.3.  Currently at baseline.

## 2023-11-15 NOTE — ED Provider Notes (Signed)
[facility] Provider Note    Event Date/Time   First MD Initiated Contact with Patient 11/15/23 1324     (approximate)   History   Cerebrovascular Accident   HPI  Philip [name] is a 82 y.o. male who presents to the emergency department today after outpatient MRI shows multiple acute strokes.  Patient is unable to give any significant history.  History is obtained from family at bedside.  They say the first noticed something unusual with the patient a few days after Thanksgiving.  They noticed some weakness in his left hand.  However that time did not have any other issues.  Since then however he has developed worsening left-sided weakness.  Had a CT scan done yesterday as an outpatient which was concerning for age-indeterminate infarct which prompted the MRI order today.     Physical Exam   Triage Vital Signs: ED Triage Vitals  Encounter Vitals Group     BP 11/15/23 0927 125/74     Systolic BP Percentile --      Diastolic BP Percentile --      Pulse Rate 11/15/23 0927 72     Resp 11/15/23 0927 20     Temp 11/15/23 0927 98 F (36.7 C)     Temp Source 11/15/23 0927 Oral     SpO2 11/15/23 0927 98 %     Weight --      Height --      Head Circumference --      Peak Flow --      Pain Score 11/15/23 0925 0     Pain Loc --      Pain Education --      Exclude from Growth Chart --     Most recent vital signs: Vitals:   11/15/23 0927 11/15/23 1339  BP: 125/74 136/86  Pulse: 72 68  Resp: 20 16  Temp: 98 F (36.7 C) 97.9 F (36.6 C)  SpO2: 98% 100%   General: Awake, alert, not completely oriented. CV:  Good peripheral perfusion. Regular rate and rhythm. Resp:  Normal effort. Lungs clear. Abd:  No distention.  Other:  PERRL. EOMI. Face symmetric. Strength 5/5 in upper and lower extremities. Sensation grossly intact.   ED Results / Procedures / Treatments   Labs (all labs ordered are listed, but only abnormal results are displayed) Labs  Reviewed  CBC - Abnormal; Notable for the following components:      Result Value   Platelets 482 (*)    All other components within normal limits  DIFFERENTIAL - Abnormal; Notable for the following components:   Eosinophils Absolute 0.8 (*)    Abs Immature Granulocytes 0.10 (*)    All other components within normal limits  COMPREHENSIVE METABOLIC PANEL - Abnormal; Notable for the following components:   Glucose, Bld 107 (*)    Creatinine, Ser 1.30 (*)    GFR, Estimated 54 (*)    All other components within normal limits  PROTIME-INR  APTT  ETHANOL  CBG MONITORING, ED     EKG  I, Phineas Semen, attending physician, personally viewed and interpreted this EKG  EKG Time: 0927 Rate: 72 Rhythm: sinus rhythm with 1st degree av block Axis: left axis deviation Intervals: qtc 429 QRS: narrow, q waves v1, v2, v3, v4 ST changes: no st elevation Impression: abnormal ekg   RADIOLOGY Outpatient MRI reviewed  PROCEDURES:  Critical Care performed: No  MEDICATIONS ORDERED IN ED: Medications - No data to display  IMPRESSION / MDM / ASSESSMENT AND PLAN / ED COURSE  I reviewed the triage vital signs and the nursing notes.                              Differential diagnosis includes, but is not limited to, CVA, atrial fibrillation, carotid artery disease  Patient's presentation is most consistent with acute presentation with potential threat to life or bodily function.   Patient presented to the emergency department today after outpatient MRI was consistent with multiple acute strokes.  On exam patient is awake and alert although not completely oriented.  EKG here without atrial fibrillation.  Discussed with Dr. Huel Cote with the hospitalist service who will evaluate for admission.  FINAL CLINICAL IMPRESSION(S) / ED DIAGNOSES   Final diagnoses:  Cerebrovascular accident (CVA), unspecified mechanism (HCC)     Note:  This document was prepared using Dragon voice  recognition software and may include unintentional dictation errors.    Phineas Semen, MD 11/15/23 [phone]

## 2023-11-15 NOTE — H&P (Signed)
History and Physical    Patient: Philip [name] VWU:[id number] DOB: 09/05/1941 DOA: 11/15/2023 DOS: the patient was seen and examined on 11/15/2023 PCP: Earnestine Mealing, MD  Patient coming from: SNF  Chief Complaint:  Chief Complaint  Patient presents with   Cerebrovascular Accident   HPI: Philip [name] is a 82 y.o. 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  Philip [name] states that for the last 2-3 weeks, he has been experiencing left-sided weakness.  His daughter at bedside states she first noticed it on December 1.  In addition, he was unable to write and seemed unable to understand how to use a pen.  She notes that he has left-handed.  Since symptom onset, weakness has persisted and his facility has noted intermittent episodes of confusion.  Philip [name] denies any slurring of speech, difficulty swallowing, blurry vision, diplopia, chest pain, palpitations, nausea, vomiting, dizziness.  He denies any right-sided weakness as far as he is aware.  ED course: On arrival to the ED, patient was normotensive at 125/74 with heart rate of 72.  He was saturating at 98% on room air.  He was afebrile at 97.9.Initial workup notable for platelets of 482, glucose 107, creatinine 1.30, GFR 54.  MRI of the brain obtained earlier today demonstrating bilateral acute and subacute infarcts.  Due to this, [facility] contacted for admission.  Review of Systems: As mentioned in the history of present illness. All other systems reviewed and are negative.  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 chin. Nodular and infiltrative patterns.EDC.   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 Nodular. EDC   Basal cell carcinoma 08/26/2020   Right mid to inf. ear helix. Nodular, ulcerated. 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 Ulcerated.   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   L upper eyelid, exc and ED, pt had BCC of L upper eyelid and excision also included this SCC   Past Surgical History:  Procedure Laterality Date   BACK SURGERY     CATARACT EXTRACTION     HERNIA REPAIR     PORTA CATH INSERTION N/A 01/01/2019   Procedure: PORTA CATH INSERTION;  Surgeon: Annice Needy, MD;  Location: [facility] INVASIVE CV LAB;  Service: Cardiovascular;  Laterality: N/A;   TONSILLECTOMY     TRANSURETHRAL RESECTION OF BLADDER TUMOR N/A 12/08/2018   Procedure: TRANSURETHRAL RESECTION OF BLADDER TUMOR (TURBT);  Surgeon: Sondra Come, MD;  Location: [facility] ORS;  Service: Urology;  Laterality: N/A;   Social History:  reports that he quit smoking about 29 years ago. His smoking use included cigarettes. He started smoking about 65 years ago. He has never used smokeless tobacco. He reports current alcohol use. He reports that he does not use drugs.  No Known Allergies  Family History  Problem Relation Age of Onset   Heart failure Mother     Prior to Admission medications   Medication Sig Start Date End Date Taking? Authorizing Provider  atorvastatin (LIPITOR) 20 MG tablet Take 20 mg by mouth daily. 05/02/18   [provider]  chlorhexidine (PERIDEX) 0.12 % solution Use as directed in the mouth or throat at bedtime. 0.5 ounce by mouth at bedtime for mouth care (swish for 30 seconds and spit after evening mouth care. DO NOT RINSE.)    [provider]   dextromethorphan-guaiFENesin (MUCINEX DM) 30-600 MG 12hr tablet Take 1 tablet by mouth 2 (two) times daily.    [provider]  lidocaine 4 % Place 1 patch onto the skin every 12 (twelve) hours as needed.    [provider]  lisinopril (ZESTRIL) 10 MG tablet Take 10 mg by mouth daily.    [provider]  magnesium chloride (SLOW-MAG) 64 MG TBEC SR tablet Take 1 tablet by mouth at bedtime.    [provider]  metoprolol succinate (TOPROL-XL) 25 MG 24 hr tablet Take 12.5 mg by mouth daily. 05/21/19   [provider]  Multiple Vitamins-Minerals (CENTRUM MEN) TABS Take 1 tablet by mouth daily.    [provider]  nystatin powder Apply to groin/affected areas topically as needed    [provider]  ondansetron (ZOFRAN) 4 MG tablet Take 4 mg by mouth every 8 (eight) hours as needed for nausea or vomiting.    [provider]  polyethylene glycol (MIRALAX / GLYCOLAX) 17 g packet Take 17 g by mouth daily as needed.    [provider]  Vitamin D, Ergocalciferol, (DRISDOL) 1.25 MG (50000 UT) CAPS capsule Take 50,000 Units by mouth every 7 (seven) days.    [provider]    Physical Exam: Vitals:   11/15/23 0927 11/15/23 1339  BP: 125/74 136/86  Pulse: 72 68  Resp: 20 16  Temp: 98 F (36.7 C) 97.9 F (36.6 C)  TempSrc: Oral Oral  SpO2: 98% 100%   Physical Exam Vitals and nursing note reviewed.  Constitutional:      General: He is not in acute distress.    Appearance: He is normal weight.  HENT:     Head: Normocephalic and atraumatic.     Mouth/Throat:     Mouth: Mucous membranes are moist.     Pharynx: Oropharynx is clear.  Eyes:     Conjunctiva/sclera: Conjunctivae normal.     Pupils: Pupils are equal, round, and reactive to light.  Cardiovascular:     Rate and Rhythm: Normal rate and regular rhythm.     Heart sounds: No murmur heard. Pulmonary:     Effort: Pulmonary effort is normal. No  respiratory distress.     Breath sounds: Normal breath sounds.  Musculoskeletal:     Right lower leg: No edema.     Left lower leg: No edema.  Skin:    General: Skin is warm and dry.  Neurological:     Mental Status: He is alert.     Comments:  Patient is awake, alert, oriented to x4. Able to give history with corroboration from patient's daughter No signs of aphasia or neglect Cranial nerves II through XII intact 5 out of 5 strength of left upper extremity throughout, 4/5 strength of right upper extremity 3 out of 5 strength of left lower extremity. 5 out of 5 strength of right lower extremity  Some dysmetria on finger-to-nose bilaterally  Sensation intact throughout  Psychiatric:        Mood and Affect: Mood normal.        Behavior: Behavior normal.    Data Reviewed: CBC with WBC of 9.6, hemoglobin of 14.9, and platelets of 42.  Differential demonstrating eosinophilic predominance CMP with sodium of 136, potassium 4.8, bicarb 25, glucose 107, BUN 22, creat 1.30, AST 24, ALT 25, GFR 54 INR and PTT within normal limits Alcohol level negative  EKG personally reviewed.  Sinus rhythm with rate of 72.  Profound first-degree AV block no ST or T wave changes consistent with acute ischemia.  Compared to EKG obtained 1 year prior, AV block is not new, however appears more pronounced today.  MRI brain without contrast IMPRESSION: 1. Acute infarcts in the posterior frontal lobe on the left and left occipital lobe. No hemorrhage or mass effect. 2. Subacute infarcts in the right posterior temporal lobe and right parietal lobe. 3. Additional contrast-enhancing lesion in the anterior right frontal lobe may represent an additional site of a subacute infarct, but this site is without an associated correlate on T2/FLAIR weighted imaging. Recommend follow-up brain MRI in 6 weeks to ensure resolution.   There are no new results to review at this time.  Assessment and Plan:  * Acute CVA  (cerebrovascular accident) [facility]) Patient is presenting with 3 weeks of left-sided weakness that is persistent today with subacute right-sided infarcts seen on imaging, however also acute infarcts in the left side.  Overall, consistent with embolic etiology.  No known history of atrial fibrillation; EKG in 2020 read as atrial fibrillation, however on review by cardiology, stated more consistent with atrial tachycardia with variable block.  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  He is on low-dose metoprolol, will hold at this time.  - Hold home metoprolol - Telemetry monitoring  Hypercholesterolemia - Increase home Lipitor to high intensity 40 mg - Lipid panel pending  Chronic kidney disease, stage 3a (HCC) History of CKD stage IIIa with baseline creatinine ranging between 1.2 and 1.3.  Currently at baseline.  Thrombocytosis Chronic stable at this time.  Unclear etiology though.  Advance Care Planning:   Code Status: Limited: Do not attempt resuscitation (DNR) -DNR-LIMITED -Do Not Intubate/DNI confirmed by patient with daughter at bedside.  Consults: Neurology  Family Communication: Patient's daughter updated at bedside  Severity of Illness: The appropriate patient status for this patient is INPATIENT. Inpatient status is judged to be reasonable and necessary in order to provide the required intensity of service to ensure the patient's safety. The patient's presenting symptoms, physical exam findings, and initial radiographic and laboratory data in the context of their  chronic comorbidities is felt to place them at high risk for further clinical deterioration. Furthermore, it is not anticipated that the patient will be medically stable for discharge from the hospital within 2 midnights of admission.   * I certify that at the point of admission it is my clinical judgment that the patient will require inpatient hospital care spanning beyond 2 midnights from the point of admission due to high intensity of service,  high risk for further deterioration and high frequency of surveillance required.*  Author: Verdene Lennert, MD 11/15/2023 2:51 PM  For on call review [email or web address].

## 2023-11-15 NOTE — ED Triage Notes (Signed)
Pt to ED from MRI. Pt reports left sided weakness x2wks. Pt had CT done yesterday showing + for stroke but age indeterminate. Pt had MRI today and brought to ED for stroke admission. Pt denies vision changes, slurred speech. Pt reports ongoing left sided weakness. Pt has caregiver present. Pt resides at [facility].

## 2023-11-15 NOTE — Assessment & Plan Note (Addendum)
Patient is presenting with 3 weeks of left-sided weakness that is persistent today with subacute right-sided infarcts seen on imaging, however also acute infarcts in the left side.  Overall, consistent with embolic etiology.  No known history of atrial fibrillation; EKG in 2020 read as atrial fibrillation, however on review by cardiology, stated more consistent with atrial tachycardia with variable block.  - Neurology consulted; appreciate their recommendations - CTA head/neck pending - Telemetry monitoring - Allow for permissive HTN (systolic < 220 and diastolic < 120) given acute infarct on the left - Echocardiogram  - A1C and Lipid panel  - Statin as ordered  - PT/OT/SLP

## 2023-11-15 NOTE — Assessment & Plan Note (Signed)
Chronic stable at this time.  Unclear etiology though.

## 2023-11-15 NOTE — ED Notes (Signed)
Pt fed puree diet, tolerated well with bed elevated 45 degrees. No choking.

## 2023-11-15 NOTE — Assessment & Plan Note (Signed)
Significant first-degree AV block noted on EKG that is chronic.  He is on low-dose metoprolol, will hold at this time.  - Hold home metoprolol - Telemetry monitoring

## 2023-11-15 NOTE — Consult Note (Signed)
NEUROLOGY CONSULT NOTE   Date of service: November 15, 2023 Patient Name: Philip [name] MRN:  [id number] DOB:  [birth year] Chief Complaint: "Confusional episodes" Requesting Provider: Verdene Lennert, MD  History of Present Illness  Philip [name] is a 82 y.o. 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  At that point he he moved into the skilled nursing area of [facility], has required help with bathing and dressing largely due to mobility but has not had any clear cognitive concerns until recently.  He had visited with his daughter over Thanksgiving and seems totally his normal self.  However Sunday when she went to his place she felt he was slouched over and just did not seem like his normal self.  He could not use the pen properly does sign his checkbook and was holding it upside down.  He was also having trouble feeding himself which improved with therapy.  However intermittently in the past 2 weeks he has had episodes of "significant confusion" of which daughter is not sure of the exact details.  In notes from his facility describes him being slow to answer and slow to process information.  Daughter also notes that he has been spending increased time in the wheelchair which she told her was because he is just slower.  He was noted to be confused about several details of his appointment with neurosurgery that had occurred just prior to his appointment with his primary doc on 11/10/2023.  Therefore head CT was ordered which revealed concern for age-indeterminate stroke.  He was sent to the ED for MRI which has revealed concern for multifocal stroke and also some  abnormal contrast-enhancement of unclear significance  LKW: Thanksgiving Modified rankin score: 4-Needs assistance to walk and tend to bodily needs IV Thrombolysis: No, out of the window EVT: No, exam not consistent with LVO   NIHSS components Score: Comment  1a Level of Conscious 0[x] 1[] 2[] 3[]     1b LOC Questions 0[x] 1[] 2[]      1c LOC Commands 0[x] 1[] 2[]      2 Best Gaze 0[x] 1[] 2[]      3 Visual 0[] 1[x] 2[] 3[]   Inconsistent trouble on the left side tends to orient more to the stimuli  4 Facial Palsy 0[x] 1[] 2[] 3[]     5a Motor Arm - left 0[x] 1[] 2[] 3[] 4[] UN[]   5b Motor Arm - Right 0[] 1[x] 2[] 3[] 4[] UN[]  Chronic right shoulder issues per patient/family  6a Motor Leg - Left 0[x] 1[] 2[] 3[] 4[] UN[]   6b Motor Leg - Right 0[x] 1[] 2[] 3[] 4[] UN[]   7 Limb Ataxia 0[x] 1[] 2[] 3[] UN[]    8 Sensory 0[x] 1[] 2[] UN[]     9 Best Language 0[x] 1[] 2[] 3[]     10 Dysarthria 0[x] 1[] 2[] UN[]     11  Extinct. and Inattention 0[x]  1[]  2[]       TOTAL: 2       ROS  Comprehensive ROS performed and pertinent positives documented in HPI    Past History   Past Medical History:  Diagnosis Date   Basal cell  carcinoma 10/02/2018   Left lat. chin. Nodular and infiltrative patterns.EDC.   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 Nodular. EDC   Basal cell carcinoma 08/26/2020   Right mid to inf. ear helix. Nodular, ulcerated. 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 Ulcerated.   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HCC)    Hypertension     Squamous cell carcinoma of skin 05/18/2022   L upper eyelid, exc and ED, pt had BCC of L upper eyelid and excision also included this SCC    Past Surgical History:  Procedure Laterality Date   BACK SURGERY     CATARACT EXTRACTION     HERNIA REPAIR     PORTA CATH INSERTION N/A 01/01/2019   Procedure: PORTA CATH INSERTION;  Surgeon: Annice Needy, MD;  Location: [facility] INVASIVE CV LAB;  Service: Cardiovascular;  Laterality: N/A;   TONSILLECTOMY     TRANSURETHRAL RESECTION OF BLADDER TUMOR N/A 12/08/2018   Procedure: TRANSURETHRAL RESECTION OF BLADDER TUMOR (TURBT);  Surgeon: Sondra Come, MD;  Location: [facility] ORS;  Service: Urology;  Laterality: N/A;    Family History: Family History  Problem Relation Age of Onset   Heart failure Mother     Social History  reports that he quit smoking about 29 years ago. His smoking use included cigarettes. He started smoking about 65 years ago. He has never used smokeless tobacco. He reports current alcohol use. He reports that he does not use drugs.  No Known Allergies  Medications   Current Facility-Administered Medications:    [START ON 11/16/2023]  stroke: early stages of recovery book, , Does not apply, Once, Verdene Lennert, MD   acetaminophen (TYLENOL) tablet 650 mg, 650 mg, Oral, Q4H PRN **OR** acetaminophen (TYLENOL) 160 MG/5ML solution 650 mg, 650 mg, Per Tube, Q4H PRN **OR** acetaminophen (TYLENOL) suppository 650 mg, 650 mg, Rectal, Q4H PRN, Verdene Lennert, MD   enoxaparin (LOVENOX) injection 40 mg, 40 mg, Subcutaneous, Q24H, Huel Cote, Iulia, MD   senna-docusate (Senokot-S) tablet 1 tablet, 1 tablet, Oral, QHS PRN, Verdene Lennert, MD  Current Outpatient Medications:    atorvastatin (LIPITOR) 20 MG tablet, Take 20 mg by mouth daily., Disp: , Rfl:    chlorhexidine (PERIDEX) 0.12 % solution, Use as directed in the mouth or throat at bedtime. 0.5 ounce by mouth at bedtime for mouth care (swish for 30 seconds and spit after evening mouth  care. DO NOT RINSE.), Disp: , Rfl:    dextromethorphan-guaiFENesin (MUCINEX DM) 30-600 MG 12hr tablet, Take 1 tablet by mouth 2 (two) times daily., Disp: , Rfl:    lidocaine 4 %, Place 1 patch onto the skin every 12 (twelve) hours as needed., Disp: , Rfl:    lisinopril (ZESTRIL) 10 MG tablet, Take 10 mg by mouth daily., Disp: , Rfl:    magnesium chloride (SLOW-MAG) 64 MG TBEC SR tablet, Take 1 tablet by mouth at bedtime., Disp: , Rfl:    metoprolol succinate (TOPROL-XL) 25 MG 24 hr tablet, Take 12.5 mg by mouth daily., Disp: ,  Rfl:    Multiple Vitamins-Minerals (CENTRUM MEN) TABS, Take 1 tablet by mouth daily., Disp: , Rfl:    nystatin powder, Apply to groin/affected areas topically as needed, Disp: , Rfl:    ondansetron (ZOFRAN) 4 MG tablet, Take 4 mg by mouth every 8 (eight) hours as needed for nausea or vomiting., Disp: , Rfl:    polyethylene glycol (MIRALAX / GLYCOLAX) 17 g packet, Take 17 g by mouth daily as needed., Disp: , Rfl:    Vitamin D, Ergocalciferol, (DRISDOL) 1.25 MG (50000 UT) CAPS capsule, Take 50,000 Units by mouth every 7 (seven) days., Disp: , Rfl:   Vitals   Vitals:   11/18/2023 0927 2023/11/18 1339  BP: 125/74 136/86  Pulse: 72 68  Resp: 20 16  Temp: 98 F (36.7 C) 97.9 F (36.6 C)  TempSrc: Oral Oral  SpO2: 98% 100%    There is no height or weight on file to calculate BMI.  Physical Exam   Constitutional: Appears well-developed and well-nourished.  Psych: Affect appropriate to situation. Eyes: No scleral injection.  HENT: No OP obstruction.  Head: Normocephalic.  Cardiovascular: Normal rate and regular rhythm.  Respiratory: Effort normal, non-labored breathing.  GI: Soft.  No distension. There is no tenderness.    Neurologic Examination   Neuro: Mental Status: Patient is awake, alert, oriented to person, place, month, year, and situation. Patient is able to give a clear and coherent history. No signs of aphasia or neglect Cranial Nerves: II: Visual  Fields are notable for some difficulty with finger counting on the left side difficult to fully characterize as patient repeatedly oriented to stimuli on that side more than on the right. Pupils are equal, round, and reactive to light.   III,IV, VI: EOMI without ptosis or diploplia.  Saccadic pursuits with perhaps some slight nystagmus bilaterally versus saccadic intrusions V: Facial sensation is symmetric to light touch VII: Facial movement is symmetric.  VIII: hearing is hard of hearing at baseline X: Uvula elevates symmetrically XI: Shoulder shrug is symmetric. XII: tongue is midline without atrophy or fasciculations.  Motor: Tone is normal. Bulk is normal. 5/5 strength was present in all four extremities, other than 4/5 in the right shoulder, 4+/5 elbow extension and bilateral hip flexors 4/5 Sensory: Sensation is symmetric to light touch in all 4 extremities Deep Tendon Reflexes: 2+ and symmetric in the biceps and patellae.  Cerebellar: FNF and HKS are intact bilaterally   Labs/Imaging/Neurodiagnostic studies   CBC:  Recent Labs  Lab Nov 18, 2023 0928  WBC 9.6  NEUTROABS 6.8  HGB 14.9  HCT 46.2  MCV 93.0  PLT 482*   Basic Metabolic Panel:  Lab Results  Component Value Date   NA 136 11-18-23   K 4.8 11/18/2023   CO2 25 11-18-23   GLUCOSE 107 (H) 18-Nov-2023   BUN 22 11/18/2023   CREATININE 1.30 (H) November 18, 2023   CALCIUM 9.6 Nov 18, 2023   GFRNONAA 54 (L) November 18, 2023   GFRAA 59 (L) 05/26/2019   Lipid Panel:  Lab Results  Component Value Date   LDLCALC 82 03/10/2023   HgbA1c: No results found for: "HGBA1C" Urine Drug Screen: No results found for: "LABOPIA", "COCAINSCRNUR", "LABBENZ", "AMPHETMU", "THCU", "LABBARB"  Alcohol Level     Component Value Date/Time   ETH <10 18-Nov-2023 0928   INR  Lab Results  Component Value Date   INR 1.0 Nov 18, 2023   APTT  Lab Results  Component Value Date   APTT 26 11/18/23   AED levels: No results found for: "  PHENYTOIN",  "ZONISAMIDE", "LAMOTRIGINE", "LEVETIRACETA"  CT Head without contrast(Personally reviewed): Age-indeterminate infarct in the left occipital lobe for which MRI brain was recommended  CT angio Head and Neck with contrast(Personally reviewed): pending  MRI Brain(Personally reviewed): Radiology impression reviewed: 1. Acute infarcts in the posterior frontal lobe on the left and left occipital lobe. No hemorrhage or mass effect. 2. Subacute infarcts in the right posterior temporal lobe and right parietal lobe. 3. Additional contrast-enhancing lesion in the anterior right frontal lobe may represent an additional site of a subacute infarct, but this site is without an associated correlate on T2/FLAIR weighted imaging. Recommend follow-up brain MRI in 6 weeks to ensure resolution.  Findings were discussed with Dr. [name] on 11/15/23 at 9:30 AM.         ASSESSMENT   Philip [name] is a 82 y.o. man presenting with multifocal lesions on MRI brain.  Certainly stroke with a central embolic source or secondary to hypercoagulable state of malignancy would be on the differential.  However he does have at least 1 if not 2 areas of contrast enhancement without clear DWI/ADC correlate which also puts metastatic disease on the differential.  RECOMMENDATIONS  - Stroke labs HgbA1c, fasting lipid panel - CTA head and neck - CT chest abdomen pelvis malignancy screening protocol - Frequent neuro checks - Echocardiogram - Prophylactic therapy-Antiplatelet med: Aspirin - dose 325mg  daily - Hold off on Plavix for now pending malignancy screening and concern for potential underlying atrial fibrillation which would require anticoagulation instead - Risk factor modification - Telemetry monitoring - Blood pressure goal   - Normotension -- out of the window for permissive hypertension - PT consult, OT consult, Speech consult - Neurology will follow, discussed with primary team via secure  chat ______________________________________________________________________  Nunzio Cory MD-PhD [facility] [phone] Available 7 AM to 7 PM, outside these hours please contact Neurologist on call listed on AMION

## 2023-11-15 NOTE — ED Notes (Signed)
Inpatient bed requested for patient

## 2023-11-15 NOTE — ED Notes (Signed)
Pt moved to an inpatient bed for comfort.

## 2023-11-16 ENCOUNTER — Inpatient Hospital Stay (HOSPITAL_COMMUNITY)
Admit: 2023-11-16 | Discharge: 2023-11-16 | Disposition: A | Discharge status: Home / Self Care | Payer: Medicare Other | Attending: Internal Medicine | Admitting: Internal Medicine

## 2023-11-16 ENCOUNTER — Other Ambulatory Visit: Payer: Self-pay | Admitting: Orthopedic Surgery

## 2023-11-16 ENCOUNTER — Ambulatory Visit: Payer: Medicare Other

## 2023-11-16 DIAGNOSIS — S32020S Wedge compression fracture of second lumbar vertebra, sequela: Secondary | ICD-10-CM

## 2023-11-16 DIAGNOSIS — I6389 Other cerebral infarction: Secondary | ICD-10-CM

## 2023-11-16 DIAGNOSIS — R569 Unspecified convulsions: Secondary | ICD-10-CM

## 2023-11-16 DIAGNOSIS — S32040S Wedge compression fracture of fourth lumbar vertebra, sequela: Secondary | ICD-10-CM

## 2023-11-16 DIAGNOSIS — G939 Disorder of brain, unspecified: Secondary | ICD-10-CM

## 2023-11-16 DIAGNOSIS — I639 Cerebral infarction, unspecified: Secondary | ICD-10-CM | POA: Diagnosis not present

## 2023-11-16 DIAGNOSIS — M47816 Spondylosis without myelopathy or radiculopathy, lumbar region: Secondary | ICD-10-CM

## 2023-11-16 LAB — CBC WITH DIFFERENTIAL/PLATELET
Abs Immature Granulocytes: 0.11 10*3/uL — ABNORMAL HIGH (ref 0.00–0.07)
Basophils Absolute: 0.1 10*3/uL (ref 0.0–0.1)
Basophils Relative: 1 %
Eosinophils Absolute: 0.7 10*3/uL — ABNORMAL HIGH (ref 0.0–0.5)
Eosinophils Relative: 7 %
HCT: 40.7 % (ref 39.0–52.0)
Hemoglobin: 13.3 g/dL (ref 13.0–17.0)
Immature Granulocytes: 1 %
Lymphocytes Relative: 16 %
Lymphs Abs: 1.6 10*3/uL (ref 0.7–4.0)
MCH: 29.7 pg (ref 26.0–34.0)
MCHC: 32.7 g/dL (ref 30.0–36.0)
MCV: 90.8 fL (ref 80.0–100.0)
Monocytes Absolute: 0.9 10*3/uL (ref 0.1–1.0)
Monocytes Relative: 9 %
Neutro Abs: 6.9 10*3/uL (ref 1.7–7.7)
Neutrophils Relative %: 66 %
Platelets: 488 10*3/uL — ABNORMAL HIGH (ref 150–400)
RBC: 4.48 MIL/uL (ref 4.22–5.81)
RDW: 14.6 % (ref 11.5–15.5)
WBC: 10.3 10*3/uL (ref 4.0–10.5)
nRBC: 0 % (ref 0.0–0.2)

## 2023-11-16 LAB — ECHOCARDIOGRAM COMPLETE
AR max vel: 2.67 cm2
AV Area VTI: 2.62 cm2
AV Area mean vel: 2.26 cm2
AV Mean grad: 2 mm[Hg]
AV Peak grad: 4.5 mm[Hg]
Ao pk vel: 1.06 m/s
Area-P 1/2: 2.29 cm2
Height: 68 in
MV VTI: 1.81 cm2
S' Lateral: 2.5 cm
Weight: 2871.27 [oz_av]

## 2023-11-16 LAB — BASIC METABOLIC PANEL
Anion gap: 7 (ref 5–15)
BUN: 22 mg/dL (ref 8–23)
CO2: 24 mmol/L (ref 22–32)
Calcium: 9.1 mg/dL (ref 8.9–10.3)
Chloride: 102 mmol/L (ref 98–111)
Creatinine, Ser: 1.29 mg/dL — ABNORMAL HIGH (ref 0.61–1.24)
GFR, Estimated: 55 mL/min — ABNORMAL LOW (ref >60)
Glucose, Bld: 88 mg/dL (ref 70–99)
Potassium: 4 mmol/L (ref 3.5–5.1)
Sodium: 133 mmol/L — ABNORMAL LOW (ref 135–145)

## 2023-11-16 LAB — MRSA NEXT GEN BY PCR, NASAL: MRSA by PCR Next Gen: NOT DETECTED

## 2023-11-16 MED ORDER — ADULT MULTIVITAMIN W/MINERALS CH
1.0000 | ORAL_TABLET | Freq: Every day | ORAL | Status: DC
  Administered 2023-11-16 – 2023-11-18 (×3): 1 via ORAL
  Filled 2023-11-16 (×3): qty 1

## 2023-11-16 MED ORDER — ASPIRIN 81 MG PO TBEC
81.0000 mg | DELAYED_RELEASE_TABLET | Freq: Every day | ORAL | Status: DC
Start: 2023-11-16 — End: 2023-11-18
  Administered 2023-11-16 – 2023-11-18 (×3): 81 mg via ORAL
  Filled 2023-11-16 (×3): qty 1

## 2023-11-16 MED ORDER — ATORVASTATIN CALCIUM 20 MG PO TABS
40.0000 mg | ORAL_TABLET | Freq: Every day | ORAL | Status: DC
  Administered 2023-11-16 – 2023-11-17 (×2): 40 mg via ORAL
  Filled 2023-11-16 (×2): qty 2

## 2023-11-16 MED ORDER — CLOPIDOGREL BISULFATE 75 MG PO TABS
75.0000 mg | ORAL_TABLET | Freq: Every day | ORAL | Status: DC
  Administered 2023-11-17 – 2023-11-18 (×2): 75 mg via ORAL
  Filled 2023-11-16 (×2): qty 1

## 2023-11-16 MED ORDER — CLOPIDOGREL BISULFATE 75 MG PO TABS
300.0000 mg | ORAL_TABLET | Freq: Once | ORAL | Status: AC
  Administered 2023-11-16: 300 mg via ORAL
  Filled 2023-11-16: qty 4

## 2023-11-16 NOTE — Evaluation (Signed)
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 Co tx compelted with PT. Daughter in room throughout. PTA pt lives at [facility], amb with RW in room, recently using mwc to get to dining hall. Supervision required for ADLs, assist as needed, assist for IADLs. Recent difficulties fleeding himself, but per report was working with OT at facility and made improvements. Pt presents with deficits in activity tolerance, strength, vision/perception, cognition all affecting safe and optimal ADL completion. Pt will benefit from acute OT to address deficits and to facilitate optimal ADL performance. OT will follow acutely.       If plan is discharge home, recommend the following: A lot of help with walking and/or transfers;A lot of help with bathing/dressing/bathroom;Assistance with cooking/housework;Direct supervision/assist for medications management;Assist for transportation;Supervision due to cognitive status;Help with stairs or ramp for entrance;Direct supervision/assist for financial management    Functional Status Assessment  Patient has had a recent decline in their functional status and demonstrates the ability to make significant improvements in function in a reasonable and predictable amount of time.  Equipment Recommendations  Other (comment) (per next venue of care)    Recommendations for Other Services       Precautions / Restrictions Precautions Precautions:  Fall Restrictions Weight Bearing Restrictions Per Provider Order: No      Mobility Bed Mobility Overal bed mobility: Needs Assistance Bed Mobility: Rolling, Sidelying to Sit Rolling: Mod assist, Max assist, +2 for physical assistance, +2 for safety/equipment, Used rails Sidelying to sit: Max assist, +2 for safety/equipment, HOB elevated, +2 for physical assistance, Used rails            Transfers Overall transfer level: Needs assistance Equipment used: Rolling walker (2 wheels) Transfers: Sit to/from Stand Sit to Stand: Contact guard assist, Min assist, From elevated surface, +2 physical assistance, +2 safety/equipment                  Balance Overall balance assessment: Needs assistance Sitting-balance support: Feet supported, Bilateral upper extremity supported Sitting balance-Leahy Scale: Good     Standing balance support: Bilateral upper extremity supported, During functional activity, Reliant on assistive device for balance Standing balance-Leahy Scale: Fair                             ADL either performed or assessed with clinical judgement   ADL Overall ADL's : Needs assistance/impaired     Grooming: Wash/dry face;Sitting;Set up               Lower Body Dressing: Maximal assistance   Toilet Transfer: Moderate assistance;Rolling walker (2 wheels);+2 for physical assistance;Cueing for safety;Cueing for sequencing Toilet Transfer Details (indicate cue type and reason): step pivot transfer with RW Toileting- Clothing Manipulation and Hygiene: Maximal assistance;Sitting/lateral lean Toileting - Clothing Manipulation Details (indicate cue type and reason): anticipate             Vision Baseline Vision/History: 1 Wears glasses Patient Visual Report: No change from baseline Vision Assessment?:  Yes;Vision impaired- to be further tested in functional context Eye Alignment: Within Functional Limits Additional Comments: attempted to draw a  clock- pt unable to draw circle, numbers not approrpiately placed in circle with 12-6 only drawn in circle after provided clock to copy; pt daugther reports unable to read a check book/write check; functional vision will continueto be assessed     Perception Perception: Impaired Preception Impairment Details: Spatial orientation Perception-Other Comments: will continue to assess   Praxis Praxis: Impaired Praxis Impairment Details: Motor planning Praxis-Other Comments: will continue to assess   Pertinent Vitals/Pain Pain Assessment Pain Assessment: Faces Faces Pain Scale: Hurts a little bit Pain Location: back pain with mobility Pain Descriptors / Indicators: Discomfort, Grimacing Pain Intervention(s): Limited activity within patient's tolerance, Premedicated before session, Repositioned, Patient requesting pain meds-RN notified, Monitored during session     Extremity/Trunk Assessment Upper Extremity Assessment Upper Extremity Assessment: Generalized weakness;Left hand dominant;LUE deficits/detail;RUE deficits/detail RUE Deficits / Details: shoulder flexion AROM to approx 90 degrees, all other AROM appears WFl; PROM grossly WFL; RUE Coordination: decreased fine motor LUE Deficits / Details: shoulder flexion AROM to approx 90 degrees, all other AROM appears WFl; PROM grossly WFL; LUE Coordination: decreased fine motor   Lower Extremity Assessment Lower Extremity Assessment: Defer to PT evaluation;Generalized weakness   Cervical / Trunk Assessment Cervical / Trunk Assessment: Kyphotic   Communication Communication Communication: Difficulty following commands/understanding Following commands: Follows one step commands consistently;Follows one step commands with increased time Cueing Techniques: Verbal cues;Tactile cues;Visual cues   Cognition Arousal: Alert Behavior During Therapy: WFL for tasks assessed/performed, Anxious Overall Cognitive Status: Impaired/Different from  baseline Area of Impairment: Orientation, Problem solving, Awareness, Following commands, Safety/judgement, Memory, Attention                 Orientation Level: Disoriented to, Situation Current Attention Level: Sustained Memory: Decreased short-term memory Following Commands: Follows one step commands consistently, Follows one step commands with increased time Safety/Judgement: Decreased awareness of deficits Awareness: Emergent Problem Solving: Slow processing, Difficulty sequencing, Requires verbal cues, Requires tactile cues       General Comments  vss throughout    Exercises Other Exercises Other Exercises: edu pt and daugther re: role of OT, role of rehab, importance of progressing oob mobility   Shoulder Instructions      Home Living Family/patient expects to be discharged to:: Skilled nursing facility                                 Additional Comments: pt lives at [facility]      Prior Functioning/Environment Prior Level of Function : Needs assist             Mobility Comments: amb with RW in room, recently has been asking for mwc to dining hall per pt daugther report ADLs Comments: ADL with supervision, PRN assist; assist for IADLs        OT Problem List: Decreased strength;Decreased activity tolerance;Decreased knowledge of use of DME or AE;Decreased coordination;Impaired vision/perception;Decreased safety awareness;Decreased cognition;Impaired balance (sitting and/or standing);Impaired UE functional use      OT Treatment/Interventions: Self-care/ADL training;Visual/perceptual remediation/compensation;Therapeutic exercise;Patient/family education;Neuromuscular education;Balance training;Energy conservation;Therapeutic activities;DME and/or AE instruction;Cognitive remediation/compensation    OT Goals(Current goals can be found in the care plan section) Acute Rehab OT Goals Patient Stated Goal: improve function OT Goal Formulation:  With patient Time For Goal Achievement: 11/30/23 Potential to Achieve Goals: Good ADL Goals Pt Will Perform  Grooming: with supervision;sitting Pt Will Perform Lower Body Dressing: with supervision;sitting/lateral leans Pt Will Transfer to Toilet: with supervision;ambulating Pt Will Perform Toileting - Clothing Manipulation and hygiene: with supervision;sitting/lateral leans  OT Frequency: Min 1X/week    Co-evaluation PT/OT/SLP Co-Evaluation/Treatment: Yes Reason for Co-Treatment: Complexity of the patient's impairments (multi-system involvement);Necessary to address cognition/behavior during functional activity;To address functional/ADL transfers;For patient/therapist safety   OT goals addressed during session: ADL's and self-care      AM-PAC OT "6 Clicks" Daily Activity     Outcome Measure Help from another person eating meals?: A Little Help from another person taking care of personal grooming?: A Little Help from another person toileting, which includes using toliet, bedpan, or urinal?: A Lot Help from another person bathing (including washing, rinsing, drying)?: A Lot Help from another person to put on and taking off regular upper body clothing?: A Little Help from another person to put on and taking off regular lower body clothing?: A Lot 6 Click Score: 15   End of Session Equipment Utilized During Treatment: Gait belt;Rolling walker (2 wheels) Nurse Communication: Mobility status  Activity Tolerance: Patient tolerated treatment well Patient left: in chair;with call bell/phone within reach;with chair alarm set  OT Visit Diagnosis: Other abnormalities of gait and mobility (R26.89);Muscle weakness (generalized) (M62.81);Cognitive communication deficit (R41.841)                Time: 1610-9604 OT Time Calculation (min): 44 min Charges:  OT General Charges $OT Visit: 1 Visit OT Evaluation $OT Eval Moderate Complexity: 1 Mod  Oleta Mouse, OTD OTR/L  11/16/23, 1:05 PM

## 2023-11-16 NOTE — Procedures (Signed)
Patient Name: Philip [name]  MRN: [id number]  Epilepsy Attending: Charlsie Quest  Referring Physician/Provider: Gordy Councilman, MD  Date: 11/16/2023 Duration: 28.08 mins  Patient history: 82 y.o. man presenting with multifocal lesions on MRI brain. EEG to evaluate for seizure  Level of alertness: Awake, asleep  AEDs during EEG study: None  Technical aspects: This EEG study was done with scalp electrodes positioned according to the 10-20 International system of electrode placement. Electrical activity was reviewed with band pass filter of 1-70Hz , sensitivity of 7 uV/mm, display speed of 50mm/sec with a 60Hz  notched filter applied as appropriate. EEG data were recorded continuously and digitally stored.  Video monitoring was available and reviewed as appropriate.  Description: The posterior dominant rhythm consists of 8 Hz activity of moderate voltage (25-35 uV) seen predominantly in posterior head regions, symmetric and reactive to eye opening and eye closing. Sleep was characterized by vertex waves, sleep spindles (12 to 14 Hz), maximal frontocentral region. EEG showed continuous generalized and lateralized left hemisphere 5 to 6 Hz theta slowing admixed with intermittent 2-3hz  delta slowing. Hyperventilation and photic stimulation were not performed.     ABNORMALITY - Continuous slow, generalized and lateralized left hemisphere   IMPRESSION: This study is suggestive of cortical dysfunction arising from left hemisphere likely secondary to underlying structural abnormality. Additionally there is moderate diffuse encephalopathy. No seizures or epileptiform discharges were seen throughout the recording.  Alfard Cochrane Annabelle Harman

## 2023-11-16 NOTE — Evaluation (Signed)
Clinical/Bedside Swallow Evaluation Patient Details  Name: Philip [name] MRN: [id number] Date of Birth: 10-18-1941  Today's Date: 11/16/2023 Time: SLP Start Time (ACUTE ONLY): 1425 SLP Stop Time (ACUTE ONLY): 1525 SLP Time Calculation (min) (ACUTE ONLY): 60 min  Past Medical History:  Past Medical History:  Diagnosis Date   Basal cell carcinoma 10/02/2018   Left lat. chin. Nodular and infiltrative patterns.EDC.   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 Nodular. EDC   Basal cell carcinoma 08/26/2020   Right mid to inf. ear helix. Nodular, ulcerated. 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 Ulcerated.   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   L upper eyelid, exc and ED, pt had BCC of L upper eyelid and excision also included this SCC   Past Surgical History:  Past Surgical History:  Procedure Laterality Date   BACK SURGERY     CATARACT EXTRACTION     HERNIA REPAIR     PORTA CATH INSERTION N/A 01/01/2019   Procedure: PORTA CATH INSERTION;  Surgeon: Annice Needy, MD;  Location: [facility] INVASIVE CV LAB;  Service: Cardiovascular;  Laterality: N/A;   TONSILLECTOMY     TRANSURETHRAL RESECTION OF BLADDER TUMOR N/A 12/08/2018   Procedure: TRANSURETHRAL RESECTION OF BLADDER TUMOR (TURBT);  Surgeon: Sondra Come, MD;  Location: [facility] ORS;  Service: Urology;  Laterality: N/A;   HPI:  Pt is an 82 year old male  resenting with multifocal lesions on MRI brain    PMH significant for hypertension, bladder  cancer s/p chemotherapy, s/p radical cystectomy with ileal conduiturostomy, 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  Per chart/pt/Family report, stated that for the last 2-3 weeks, pt has been experiencing left-sided weakness.  First noticed it on December 1. Intermittent confusion noted at his Facility.   At Baseline, "Supervision required for ADLs, assist as needed, assist for IADLs".    MRI: Atrophy and moderate chronic microvascular ischemic change. Chronic infarct left cerebellum. Acute infarcts in the posterior frontal lobe on the left and left  occipital lobe. No hemorrhage or mass effect. Subacute infarcts in the right posterior temporal lobe and right parietal lobe.  Chest Imaging: Lungs/Pleura: Centrilobular and paraseptal emphysema greatest in the  upper lobes. Diffuse bronchial wall thickening greatest in the lower  lobes. No focal consolidation, pleural effusion.     Assessment / Plan / Recommendation  Clinical Impression   Pt seen for BSE this PM post multiple tests. Pt endorsed wanting "to eat a meal". Pt awake, resting in bed. Verbal and answered basic questions re: self but noted he needed cues for follow through w/ instructions/tasks at times. Unsure of his Baseline Cognitive status living in a Facility; see prior Imaging. Pt was alert to self, hospital. Stated he had had "a lot of tests today"; noted per chart. Per OT note/chart, "Supervision required for ADLs, assist as needed,  assist for IADLs. Recent difficulties feeding himself, but per report was working with OT at facility and made improvements.".  On RA, afebrile. WBC WNL.  Pt appears to present w/ functional oropharyngeal phase swallowing w/ No overt oropharyngeal phase dysphagia noted, No sensorimotor deficits noted. Pt consumed po trials w/ No overt, clinical s/s of aspiration during po trials.  Pt appears at reduced  risk for aspiration following general aspiration precautions. However, pt does have challenging factors that could impact oropharyngeal swallowing to include Chronic Back Pain/discomfort(NSG aware), deconditioning/weakness, need for recent support w/ feeding at his Facility, Supervision w/ ADLs, and advanced age w/ Acute illness/CVA. These factors can increase risk for aspiration, dysphagia as well as decreased oral intake overall.   During po trials, pt consumed all consistencies w/ no overt coughing, decline in vocal quality, or change in respiratory presentation during/post trials. O2 sats remained 97-99%. Oral phase appeared [facility] w/ timely bolus management, mastication, and control of bolus propulsion for A-P transfer for swallowing. Oral clearing achieved w/ all trial consistencies -- moistened, soft foods given.  OM Exam appeared [facility] w/ no unilateral weakness noted. Speech Clear. Pt fed self w/ setup support.   Recommend a fairly Regular consistency diet(some Mech Soft components w/ well-Cut meats, moistened foods); Thin liquids -- Cup drinking ONLY(No Straws for best control). Recommend general aspiration precautions, reduce distractions at meals. Tray setup and positioning support at meals. Feeding support as needed. Pills WHOLE in Puree for safer, easier swallowing -- encouraged now and for D/C.   Education given on Pills in Puree; food consistencies and easy to eat options; general aspiration precautions w/ NO STRAWS when drinking to pt. No further skilled ST services indicated for swallowing as he appears at a functional presentation. IF any decline in his cognitive-communication abilities, recommend f/u at his SNF/Facility for more formal assessment and tx needs Post Acute illness and current back discomfort(he would be able to better focus in a more structured setting, and he was already receiving assistance w/ ADLs per OT note). MD/NSG to reconsult if any new swallowing needs arise while  admitted. NSG updated, agreed. MD updated. Recommend Dietician f/u for support. SLP Visit Diagnosis: Dysphagia, unspecified (R13.10)    Aspiration Risk   (reduced following general aspiration precautions and sitting upright for oral intake)    Diet Recommendation   Thin;Dysphagia 3 (mechanical soft);Age appropriate regular (Cut/Chopped meats, foods moistened) = a fairly Regular consistency diet(some Mech Soft components w/ well-Cut meats, moistened foods); Thin liquids -- Cup drinking ONLY(No Straws for best control). Recommend general aspiration precautions, reduce distractions at meals. Tray setup and positioning support at meals. Feeding support as needed.   Medication Administration: Whole meds with puree (recommended)    Other  Recommendations Recommended Consults:  (Dietician as needed) Oral Care Recommendations: Oral care BID;Patient independent with oral care (setup and support w/ partial denture)    Recommendations for follow up therapy are one component of a multi-disciplinary discharge planning process, led by the attending physician.  Recommendations may be updated based on patient status, additional functional criteria and insurance authorization.  Follow up Recommendations No SLP follow up      Assistance Recommended at Discharge  Intermittent  Functional Status Assessment Patient has had a recent decline in their functional status and demonstrates the ability to make significant improvements in function in a reasonable and predictable amount of time.  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  Per chart/pt/Family report, stated that for the last 2-3 weeks, pt has been experiencing left-sided weakness.  First noticed it on December 1. Intermittent confusion noted at his Facility.  At Baseline, "Supervision required for ADLs, assist as needed, assist for IADLs".   MRI: Atrophy and moderate chronic microvascular ischemic change. Chronic infarct left cerebellum. Acute infarcts in the posterior frontal lobe on the left and left  occipital lobe. No hemorrhage or mass effect. Subacute infarcts in the right posterior temporal lobe and right parietal lobe.  Chest Imaging: Lungs/Pleura: Centrilobular and paraseptal emphysema greatest in the  upper lobes. Diffuse bronchial wall thickening greatest in the lower  lobes. No focal consolidation, pleural effusion. Type of Study: Bedside Swallow Evaluation Previous Swallow Assessment: none Diet Prior to this Study: Thin liquids (Level 0);Dysphagia 1 (pureed) Temperature Spikes Noted: No (wbc 10.3) Respiratory Status: Room air History of Recent Intubation: No Behavior/Cognition: Alert;Cooperative;Pleasant mood;Distractible;Requires cueing Oral Cavity Assessment: Within Functional Limits;Dry Oral Care Completed by SLP: Yes Oral Cavity - Dentition: Adequate natural dentition (upper partial plate placed) Vision: Functional for self-feeding Self-Feeding Abilities: Able to feed self;Needs assist;Needs set up Patient Positioning: Upright in bed (needed positioning support) Baseline Vocal Quality: Normal Volitional Cough: Strong Volitional Swallow: Able to elicit    Oral/Motor/Sensory Function Overall Oral Motor/Sensory Function: Within functional limits   Ice Chips Ice chips: Within functional limits Presentation:  Spoon (fed; 3 trials)   Thin Liquid Thin Liquid: Within functional limits Presentation: Cup;Self Fed (~8 ozs total) Other Comments: water, tea    Nectar Thick Nectar Thick Liquid: Not tested   Honey Thick Honey Thick Liquid: Not tested   Puree Puree: Within functional limits Presentation: Self Fed;Spoon (~3 ozs)   Solid     Solid: Within functional limits Presentation: Self Fed;Spoon (9 trials)        Philip Som, MS, CCC-SLP Speech Language Pathologist Rehab Services; [facility] [phone] (ascom) Philip [name] 11/16/2023,4:06 PM

## 2023-11-16 NOTE — Progress Notes (Signed)
*  PRELIMINARY RESULTS* Echocardiogram 2D Echocardiogram has been performed.  Philip [name] 11/16/2023, 2:21 PM

## 2023-11-16 NOTE — Progress Notes (Signed)
Eeg done 

## 2023-11-16 NOTE — Evaluation (Signed)
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 Pt received in bed with wife at bedside and agreed to PT session. Pt reported on and off pain in his back to be on a scale of 10/10 at its worst. Pt performed bed mobility supine>sidelying Mod-MaxAx2/sidelying>sitting MaxA+2 for trunk and BLE management, STS with the use of RW (2wheels) CGA-MinAx2, and step pivot to recliner ModAx2 to complete session. While seated EOB, pt demonstrated seated balance of MaxA for trunk management until BLE and BUE were supported. During transfer, pt demonstrated difficulty sequencing steps and perceiving distance to recliner in order to sit down, VC necessary for safety. VC throughout session necessary for mobility techniques to prevent exacerbation or increase of back pain as well as to maintain upright posture with standing balance and transfers. Pt tolerated Tx well and will continue to benefit from skilled PT sessions to improve functional mobility and activity tolerance to maximize safety/IND following D/C.        If plan is discharge home, recommend the following: A lot of help with walking and/or transfers;Assist for transportation;Help with stairs or ramp for entrance;Supervision due to cognitive status   Can travel by private vehicle   No    Equipment Recommendations Other (comment) (TBD)  Recommendations for Other Services       Functional Status Assessment Patient has had a recent decline in their functional status and demonstrates the ability  to make significant improvements in function in a reasonable and predictable amount of time.     Precautions / Restrictions Precautions Precautions: Fall Restrictions Weight Bearing Restrictions Per Provider Order: No      Mobility  Bed Mobility Overal bed mobility: Needs Assistance Bed Mobility: Rolling, Sidelying to Sit Rolling: Mod assist, Max assist, +2 for physical assistance, +2 for safety/equipment, Used rails Sidelying to sit: Max assist, +2 for safety/equipment, HOB elevated, +2 for physical assistance, Used rails       General bed mobility comments: Pt performed bed mobility Mod-MaxA+2 for trunk and BLE management. Pt did not report any s/sx relative to dizziness while seated EOB    Transfers Overall transfer level: Needs assistance Equipment used: Rolling walker (2 wheels) Transfers: Sit to/from Stand, Bed to chair/wheelchair/BSC Sit to Stand: Contact guard assist, Min assist, From elevated surface, +2 physical assistance, +2 safety/equipment   Step pivot transfers: Mod assist, +2 physical assistance, +2 safety/equipment       General transfer comment: Pt presented with decreased perception and sequencing when taking the steps necessary to sit down in the recliner in the appropriate spot    Ambulation/Gait               General Gait Details: deferred  Stairs            Wheelchair Mobility     Tilt Bed    Modified Rankin (Stroke Patients Only)       Balance Overall balance assessment: Needs assistance Sitting-balance support: Feet supported, Bilateral upper extremity supported Sitting balance-Leahy Scale: Good     Standing balance support: Bilateral upper extremity supported, During functional activity, Reliant  on assistive device for balance Standing balance-Leahy Scale: Fair                               Pertinent Vitals/Pain Pain Assessment Pain Assessment: Faces Faces Pain Scale: Hurts a little bit Pain Location:  back pain with mobility Pain Descriptors / Indicators: Discomfort, Grimacing Pain Intervention(s): Limited activity within patient's tolerance, Premedicated before session, Repositioned, Patient requesting pain meds-RN notified, Monitored during session    Home Living Family/patient expects to be discharged to:: Skilled nursing facility                   Additional Comments: pt lives at [facility]    Prior Function Prior Level of Function : Needs assist             Mobility Comments: amb with RW in room, recently has been asking for mwc to dining hall per pt daugther report ADLs Comments: ADL with supervision, PRN assist; assist for IADLs     Extremity/Trunk Assessment   Upper Extremity Assessment Upper Extremity Assessment: Generalized weakness RUE Deficits / Details: shoulder flexion AROM to approx 90 degrees, all other AROM appears WFl; PROM grossly WFL; RUE Coordination: decreased fine motor LUE Deficits / Details: shoulder flexion AROM to approx 90 degrees, all other AROM appears WFl; PROM grossly WFL; LUE Coordination: decreased fine motor    Lower Extremity Assessment Lower Extremity Assessment: Generalized weakness    Cervical / Trunk Assessment Cervical / Trunk Assessment: Kyphotic  Communication   Communication Communication: Difficulty following commands/understanding Following commands: Follows one step commands inconsistently;Follows one step commands with increased time Cueing Techniques: Verbal cues;Tactile cues;Visual cues  Cognition Arousal: Alert Behavior During Therapy: WFL for tasks assessed/performed, Anxious Overall Cognitive Status: Impaired/Different from baseline Area of Impairment: Orientation, Problem solving, Awareness, Following commands, Safety/judgement, Memory, Attention                 Orientation Level: Disoriented to, Situation Current Attention Level: Sustained Memory: Decreased short-term memory Following  Commands: Follows one step commands consistently, Follows one step commands with increased time Safety/Judgement: Decreased awareness of deficits Awareness: Emergent Problem Solving: Slow processing, Difficulty sequencing, Requires verbal cues, Requires tactile cues General Comments: Pt pleasant and willing to participate in PT session        General Comments General comments (skin integrity, edema, etc.): vss throughout    Exercises     Assessment/Plan    PT Assessment Patient needs continued PT services  PT Problem List Decreased activity tolerance;Decreased balance;Decreased mobility;Pain       PT Treatment Interventions DME instruction;Gait training;Functional mobility training;Therapeutic activities;Therapeutic exercise;Balance training    PT Goals (Current goals can be found in the Care Plan section)  Acute Rehab PT Goals Patient Stated Goal: To improve PT Goal Formulation: With patient/family Time For Goal Achievement: 11/30/23 Potential to Achieve Goals: Good    Frequency Min 1X/week     Co-evaluation PT/OT/SLP Co-Evaluation/Treatment: Yes Reason for Co-Treatment: Complexity of the patient's impairments (multi-system involvement);Necessary to address cognition/behavior during functional activity;To address functional/ADL transfers;For patient/therapist safety PT goals addressed during session: Mobility/safety with mobility OT goals addressed during session: ADL's and self-care       AM-PAC PT "6 Clicks" Mobility  Outcome Measure Help needed turning from your back to your side while in a flat bed without using bedrails?: A Lot Help needed moving from lying on your back to sitting on the side of a flat  bed without using bedrails?: A Lot Help needed moving to and from a bed to a chair (including a wheelchair)?: A Lot Help needed standing up from a chair using your arms (e.g., wheelchair or bedside chair)?: A Lot Help needed to walk in hospital room?: A Lot Help  needed climbing 3-5 steps with a railing? : Total 6 Click Score: 11    End of Session Equipment Utilized During Treatment: Gait belt Activity Tolerance: Patient tolerated treatment well;Patient limited by fatigue Patient left: in chair;with call bell/phone within reach;with chair alarm set;with family/visitor present Nurse Communication: Mobility status PT Visit Diagnosis: Unsteadiness on feet (R26.81);Other abnormalities of gait and mobility (R26.89);Muscle weakness (generalized) (M62.81);Difficulty in walking, not elsewhere classified (R26.2);Pain    Time: 8413-2440 PT Time Calculation (min) (ACUTE ONLY): 27 min   Charges:   PT Evaluation $PT Eval Moderate Complexity: 1 Mod   PT General Charges $$ ACUTE PT VISIT: 1 Visit         Brenna Friesenhahn Sauvignon Howard SPT, LAT, ATC  Andros Channing Sauvignon-Howard 11/16/2023, 2:13 PM

## 2023-11-16 NOTE — Progress Notes (Signed)
Initial Nutrition Assessment  DOCUMENTATION CODES:   Not applicable  INTERVENTION:   -Magic cup TID with meals, each supplement provides 290 kcal and 9 grams of protein  -MVI with minerals daily -RD to follow for diet advancement and adjust supplement regimen as appropriate  NUTRITION DIAGNOSIS:   Increased nutrient needs related to acute illness as evidenced by estimated needs.  GOAL:   Patient will meet greater than or equal to 90% of their needs  MONITOR:   PO intake, Supplement acceptance, Diet advancement  REASON FOR ASSESSMENT:   Malnutrition Screening Tool    ASSESSMENT:   Pt with medical history significant of hypertension, CKD stage IIIa, bladder cancer s/p radical cystectomy with ileal conduit, hyperlipidemia, who presents due to CVA.  Pt admitted with acute CVA.   12/17- dysphagia 1 diet with thin liquids  Reviewed I/O's: -260 ml x 24 hours   UOP: 500 ml x 24 hours  Spoke with pt at bedside, who was pleasant and in good spirits today. Pt reports feeling better. He shares that he has a good appetite both now and PTA. Per pt, he generally consumes 3 meals per day at home and has no issues chewing or swallowing foods.   Pt shares that he is tolerating puree diet well, but questioning rationale behind this, as he feels like he can tolerate upgraded textures. RD informed pt that SLP eval has been ordered and that there was potential for diet upgrade after evaluation, however, will defer to SLP for pt safety. Pt appreciative and thankful for explanation.   Pt shares that that he lost about 30# a few years ago after undergoing treatments for bladder cancer, but "I've gained that all back". No wt loss noted over the past 6 months. Noted same weight recorded from 10/31/23-11/15/23. Unsure if most recent wt is a stated wt vs measured wt; RD will obtain new wt to better assess weight changes.   Discussed importance of good meal and supplement intake to promote healing.  Pt amenable to supplements.   Medications reviewed and include plavix, lovenox, and magnesium chloride.   Labs reviewed: Na: 133, CBGS: 101 (inpatient orders for glycemic control are none).    NUTRITION - FOCUSED PHYSICAL EXAM:  Flowsheet Row Most Recent Value  Orbital Region No depletion  Upper Arm Region No depletion  Thoracic and Lumbar Region No depletion  Buccal Region No depletion  Temple Region No depletion  Clavicle Bone Region No depletion  Clavicle and Acromion Bone Region No depletion  Scapular Bone Region No depletion  Dorsal Hand Mild depletion  Patellar Region No depletion  Anterior Thigh Region No depletion  Posterior Calf Region No depletion  Edema (RD Assessment) None  Hair Reviewed  Eyes Reviewed  Mouth Reviewed  Skin Reviewed  Nails Reviewed       Diet Order:   Diet Order             DIET - DYS 1 Room service appropriate? Yes with Assist; Fluid consistency: Thin  Diet effective now                   EDUCATION NEEDS:   Education needs have been addressed  Skin:  Skin Assessment: Reviewed RN Assessment  Last BM:  11/15/23  Height:   Ht Readings from Last 1 Encounters:  11/15/23 5\' 8"  (1.727 m)    Weight:   Wt Readings from Last 1 Encounters:  11/15/23 81.4 kg    Ideal Body Weight:  70 kg  BMI:  Body mass index is 27.29 kg/m.  Estimated Nutritional Needs:   Kcal:  1750-1950  Protein:  90-105 grams  Fluid:  > 1.7 L    Levada Schilling, RD, LDN, CDCES Registered Dietitian III Certified Diabetes Care and Education Specialist If unable to reach this RD, please use "RD Inpatient" group chat on secure chat between hours of 8am-4 pm daily

## 2023-11-16 NOTE — Progress Notes (Addendum)
NEUROLOGY CONSULT FOLLOW UP NOTE   Date of service: November 16, 2023 Patient Name: Philip [name] MRN:  [id number] DOB:  Mar 01, 1941  Brief HPI  Philip [name] is a 82 y.o. male Philip [name] is a 82 y.o. 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  At that point he he moved into the skilled nursing area of [facility], has required help with bathing and dressing largely due to mobility but has not had any clear cognitive concerns until recently.  He had visited with his daughter over Thanksgiving and seems totally his normal self.  However Sunday when she went to his place she felt he was slouched over and just did not seem like his normal self.  He could not use the pen properly does sign his checkbook and was holding it upside down.  He was also having trouble feeding himself which improved with therapy.  However intermittently in the past 2 weeks he has had episodes of "significant confusion" of which daughter is not sure of the exact details.  In notes from his facility describes him being slow to answer and slow to process information.  Daughter also notes that he has been spending increased time in the wheelchair which he told her was because he is just slower.  He was noted to be confused about several details of his appointment with neurosurgery that had occurred just prior to his appointment with his primary doc on 11/10/2023.  Therefore head CT was ordered which revealed concern for age-indeterminate stroke.  He was sent to the ED for MRI which has revealed concern for multifocal stroke and also some abnormal contrast-enhancement of unclear significance     Interval Hx/subjective   -Eager to go home reports he is feeling better than yesterday  Vitals   Vitals:   11/15/23 1954 11/15/23 1956 11/15/23 2356 11/16/23 0356  BP:  137/82 131/70 119/65  Pulse:  79 71 68  Resp:  17 18 17   Temp:  98 F (36.7 C) 97.7 F (36.5 C) (!) 97.5 F (36.4 C)  TempSrc:   Oral Oral  SpO2:  94% 93% 94%  Weight: 81.4 kg     Height: 5\' 8"  (1.727 m)        Body mass index is 27.29 kg/m.  Physical Exam   Constitutional: Appears well-developed and well-nourished.  Psych: Affect appropriate to situation. Eyes: No scleral injection.  HENT: No OP obstruction.  Head: Normocephalic.  Cardiovascular: Normal rate and regular rhythm.  Respiratory: Effort normal, non-labored breathing.  GI: Soft.  No distension. There is no tenderness.     Neurologic Examination   Neuro: Mental Status: Patient is awake, alert, oriented to person, place, month, year, and situation, but disoriented to day of the week and unable to state the date. No signs of aphasia or neglect Cranial Nerves: II: Visual Fields are notable for some difficulty with finger counting on the left lower quadrant. Pupils are equal, round, and reactive to light.   III,IV, VI: EOMI without ptosis or diploplia.  Saccadic pursuits with perhaps some slight nystagmus bilaterally versus saccadic intrusions V: Facial sensation is symmetric to light touch VII: Facial movement is symmetric.  VIII: hearing is  hard of hearing at baseline X: Uvula elevates symmetrically XI: Shoulder shrug is symmetric. XII: tongue is midline without atrophy or fasciculations.  Motor: Only drift testing was performed today and stable from yesterday with slight drift in the right upper extremity from his shoulder weakness.  Full strength testing from 12/17: 5/5 strength was present in all four extremities, other than 4/5 in the right shoulder, 4+/5 elbow extension and bilateral hip flexors 4/5 Sensory: Sensation is symmetric  to light touch in all 4 extremities Deep Tendon Reflexes: 2+ and symmetric in the biceps and patellae on 12/17 Cerebellar: FNF and HKS are intact bilaterally            NIHSS components Score: Comment  1a Level of Conscious 0[x]  1[]  2[]  3[]         1b LOC Questions 0[x]  1[]  2[]           1c LOC Commands 0[x]  1[]  2[]           2 Best Gaze 0[x]  1[]  2[]           3 Visual 0[]  1[x]  2[]  3[]      Inconsistent trouble on the left side tends to orient more to the stimuli  4 Facial Palsy 0[x]  1[]  2[]  3[]         5a Motor Arm - left 0[x]  1[]  2[]  3[]  4[]  UN[]     5b Motor Arm - Right 0[]  1[x]  2[]  3[]  4[]  UN[]   Chronic right shoulder issues per patient/family  6a Motor Leg - Left 0[x]  1[]  2[]  3[]  4[]  UN[]     6b Motor Leg - Right 0[x]  1[]  2[]  3[]  4[]  UN[]     7 Limb Ataxia 0[x]  1[]  2[]  3[]  UN[]       8 Sensory 0[x]  1[]  2[]  UN[]         9 Best Language 0[x]  1[]  2[]  3[]         10 Dysarthria 0[x]  1[]  2[]  UN[]         11 Extinct. and Inattention 0[x]  1[]  2[]           TOTAL: 2           Medications  Current Facility-Administered Medications:     stroke: early stages of recovery book, , Does not apply, Once, Verdene Lennert, MD   acetaminophen (TYLENOL) tablet 650 mg, 650 mg, Oral, Q6H WA, Verdene Lennert, MD, 650 mg at 11/15/23 1725   aspirin tablet 325 mg, 325 mg, Oral, Daily, Kendyll Huettner L, MD, 325 mg at 11/15/23 1725   atorvastatin (LIPITOR) tablet 40 mg, 40 mg, Oral, Daily, Verdene Lennert, MD, 40 mg at 11/15/23 2150   dextromethorphan-guaiFENesin (MUCINEX DM) 30-600 MG per 12 hr tablet 1 tablet, 1 tablet, Oral, BID PRN, Verdene Lennert, MD   enoxaparin (LOVENOX) injection 40 mg, 40 mg, Subcutaneous, Q24H, Verdene Lennert, MD, 40 mg at 11/15/23 2149   lidocaine (LIDODERM) 5 % 1 patch, 1 patch, Transdermal, Q24H, Huel Cote, Iulia, MD   lisinopril (ZESTRIL) tablet 10 mg, 10 mg, Oral, Daily, Verdene Lennert, MD   magnesium chloride (SLOW-MAG) 64 MG SR tablet 64 mg, 1 tablet, Oral, QHS, Verdene Lennert,  MD, 64 mg at 11/15/23 2149   senna-docusate (Senokot-S) tablet 1 tablet, 1 tablet, Oral, QHS PRN, Verdene Lennert, MD   traMADol (ULTRAM) tablet 50 mg, 50 mg, Oral, Q6H PRN, Verdene Lennert, MD, 50 mg at 11/15/23 2150   Current Outpatient Medications  Medication Instructions   atorvastatin (LIPITOR) 20 mg, Oral, Daily   chlorhexidine (PERIDEX) 0.12 % solution Mouth/Throat, Daily at bedtime, 0.5  ounce by mouth at bedtime for mouth care (swish for 30 seconds and spit after evening mouth care. DO NOT RINSE.)   dextromethorphan-guaiFENesin (MUCINEX DM) 30-600 MG 12hr tablet 1 tablet, Oral, 2 times daily   fosfomycin (MONUROL) 3 g, Oral,  Once   lidocaine 4 % 1 patch, Transdermal, Every 12 hours PRN   lisinopril (ZESTRIL) 10 mg, Oral, Daily   magnesium chloride (SLOW-MAG) 64 MG TBEC SR tablet 1 tablet, Oral, Daily at bedtime   metoprolol succinate (TOPROL-XL) 12.5 mg, Oral, Daily   Multiple Vitamins-Minerals (CENTRUM MEN) TABS 1 tablet, Oral, Daily   nystatin powder Apply to groin/affected areas topically as needed   ondansetron (ZOFRAN) 4 mg, Oral, Every 8 hours PRN   polyethylene glycol (MIRALAX / GLYCOLAX) 17 g, Oral, Daily PRN   Vitamin D (Ergocalciferol) (DRISDOL) 50,000 Units, Oral, Every 7 days    Labs and Diagnostic Imaging   CBC:  Recent Labs  Lab 11/15/23 0928 11/16/23 0552  WBC 9.6 10.3  NEUTROABS 6.8 6.9  HGB 14.9 13.3  HCT 46.2 40.7  MCV 93.0 90.8  PLT 482* 488*    Basic Metabolic Panel:  Lab Results  Component Value Date   NA 133 (L) 11/16/2023   K 4.0 11/16/2023   CO2 24 11/16/2023   GLUCOSE 88 11/16/2023   BUN 22 11/16/2023   CREATININE 1.29 (H) 11/16/2023   CALCIUM 9.1 11/16/2023   GFRNONAA 55 (L) 11/16/2023   GFRAA 59 (L) 05/26/2019   Lipid Panel:  Lab Results  Component Value Date   CHOL 148 11/15/2023   HDL 42 11/15/2023   LDLCALC 90 11/15/2023   TRIG 79 11/15/2023   CHOLHDL 3.5 11/15/2023    HgbA1c:  Lab Results  Component Value Date    HGBA1C 5.6 11/15/2023   Alcohol Level     Component Value Date/Time   ETH <10 11/15/2023 0928   INR  Lab Results  Component Value Date   INR 1.0 11/15/2023   APTT  Lab Results  Component Value Date   APTT 26 11/15/2023   CT Chest/Abdomen/Pelvis: 1. No evidence of malignancy in the chest, abdomen, or pelvis. 2. 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 3. Cholelithiasis. 4. COPD.  CT Head without contrast(Personally reviewed): Age-indeterminate infarct in the left occipital lobe for which MRI brain was recommended  CT angio Head and Neck with contrast(Personally reviewed): 1. 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 This is favored to be chronic given the loss of the proximal flow void on the 11/24/2022 MRI head. 2. No other intracranial large vessel occlusion. Moderate stenosis in the proximal left cavernous ICA and mild stenosis in the left supraclinoid ICA and proximal right P2. 3. No other hemodynamically significant stenosis in the neck. 4. Redemonstrated hypodensity in the left occipital lobe, which correlates with 1 of the acute infarcts seen on the same-day MRI. The other acute infarcts do not have definite CT correlates. 5. Aortic atherosclerosis (ICD10-I70.0) and Emphysema (ICD10-J43.9).  MRI Brain(Personally reviewed): Radiology impression reviewed: 1. Acute infarcts in the posterior frontal lobe on the left and left occipital lobe. No hemorrhage or mass effect. 2. Subacute infarcts in the right posterior temporal lobe and right parietal lobe. 3. Additional contrast-enhancing lesion in the anterior right frontal lobe may represent an additional site of a subacute infarct, but this site is without an associated correlate on T2/FLAIR weighted imaging. Recommend follow-up brain MRI in 6  weeks to ensure resolution.         rEEG:  -  Continuous slow, generalized and lateralized left hemisphere  This study is suggestive of cortical dysfunction arising from left hemisphere likely secondary to underlying structural abnormality. Additionally there is moderate diffuse encephalopathy. No seizures or epileptiform discharges were seen throughout the recording.  ECHO  1. Left ventricular ejection fraction, by estimation, is 60 to 65%. The  left ventricle has normal function. The left ventricle has no regional  wall motion abnormalities. There is mild left ventricular hypertrophy.  Left ventricular diastolic parameters  are consistent with Grade I diastolic dysfunction (impaired relaxation).   2. Right ventricular systolic function is normal. The right ventricular  size is normal.   3. The mitral valve is normal in structure. No evidence of mitral valve  regurgitation. No evidence of mitral stenosis.   4. The aortic valve is normal in structure. Aortic valve regurgitation is  not visualized. No aortic stenosis is present.   5. The inferior vena cava is normal in size with greater than 50%  respiratory variability, suggesting right atrial pressure of 3 mmHg.  [Normal biatrial sizes]  Assessment   Philip [name] is a 82 y.o. 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 at this time  EEG reassuring against seizures explaining intermittent confusional episodes  Recommendations  - A1c meeting goal 5.6% - LDL above goal at 90, increase home atorvastatin from 20 mg to 40 mg - Antiplatelet: Aspirin 81 mg daily indefinitely; Plavix 300 mg loading dose followed by 75 mg daily for 3 weeks - Repeat MRI brain in ~6-8 weeks to confirm resolution of abnormal enhancement - 30-day event monitor prior to discharge, if this is negative will need loop recorder placement for embolic stroke of undetermined source - Inpatient neurology  will sign off at this time, please reach out if any additional questions or concerns arise - Discussed with Dr. Mayford Knife and cardiology PA via secure chat - Updated daughter Dr. Lonia Skinner via phone; all of her questions were answered ______________________________________________________________________   Nunzio Cory MD-PhD [facility] [phone]  [facility] coverage for [facility] is from 8 AM to 4 AM in-house and 4 PM to 8 PM by telephone/video. 8 PM to 8 AM emergent questions or overnight urgent questions should be addressed to Teleneurology On-call or [facility] neurohospitalist; contact information can be found on AMION

## 2023-11-16 NOTE — Progress Notes (Signed)
PROGRESS NOTE    Philip [name]  QMV:[id number] DOB: August 27, 1941 DOA: 11/15/2023 PCP: Earnestine Mealing, MD    Assessment & Plan:   Principal Problem:   Acute CVA (cerebrovascular accident) [facility]) Active Problems:   Essential hypertension   First degree AV block   Hypercholesterolemia   Chronic kidney disease, stage 3a (HCC)   Thrombocytosis  Assessment and Plan: Acute CVA: presenting with 3 weeks of left-sided weakness that is persistent today with subacute right-sided infarcts seen on imaging, however also acute infarcts in the left side.  Likely embolic etiology.  No known history of atrial fibrillation; EKG in 2020 read as atrial fibrillation, however on review by cardiology, stated more consistent with atrial tachycardia with variable block. Echo ordered. EEG shows no seizures or epileptiform discharges were seen throughout the recording. Speech consulted. OT/PT ordered. Neuro following and recs apprec    HTN: continue on lisinopril    First degree AV block: continue on tele. Holding metoprolol    HLD: continue on statin   CKDIIIa: Cr is trending down from day prior. Avoid nephrotoxic meds   Thrombocytosis: etiology unclear. Labile       DVT prophylaxis: SCDs Code Status: DNR Family Communication:  Disposition Plan:  depends on PT/OT recs   Level of care: Telemetry Medical Status is: Inpatient Remains inpatient appropriate because: severity of illness    Consultants:  Neuro   Procedures:   Antimicrobials:   Subjective: Pt c/o cold lunch   Objective: Vitals:   11/15/23 1954 11/15/23 1956 11/15/23 2356 11/16/23 0356  BP:  137/82 131/70 119/65  Pulse:  79 71 68  Resp:  17 18 17   Temp:  98 F (36.7 C) 97.7 F (36.5 C) (!) 97.5 F (36.4 C)  TempSrc:   Oral Oral  SpO2:  94% 93% 94%  Weight: 81.4 kg     Height: 5\' 8"  (1.727 m)       Intake/Output Summary (Last 24 hours) at 11/16/2023 2841 Last data filed at 11/16/2023 0201 Gross per 24 hour   Intake 240 ml  Output 500 ml  Net -260 ml   Filed Weights   11/15/23 1954  Weight: 81.4 kg    Examination:  General exam: Appears frustrated and agitated   Respiratory system: Clear to auscultation. Respiratory effort normal. Cardiovascular system: S1 & S2+ No rubs, gallops or clicks.  Gastrointestinal system: Abdomen is nondistended, soft and nontender. Normal bowel sounds heard. Central nervous system: Alert and awake. Psychiatry: Judgement and insight appears poor. Frustrated mood and affect   Data Reviewed: I have personally reviewed following labs and imaging studies  CBC: Recent Labs  Lab 11/15/23 0928 11/16/23 0552  WBC 9.6 10.3  NEUTROABS 6.8 6.9  HGB 14.9 13.3  HCT 46.2 40.7  MCV 93.0 90.8  PLT 482* 488*   Basic Metabolic Panel: Recent Labs  Lab 11/15/23 0928 11/16/23 0552  NA 136 133*  K 4.8 4.0  CL 101 102  CO2 25 24  GLUCOSE 107* 88  BUN 22 22  CREATININE 1.30* 1.29*  CALCIUM 9.6 9.1   GFR: Estimated Creatinine Clearance: 41.2 mL/min (A) (by C-G formula based on SCr of 1.29 mg/dL (H)). Liver Function Tests: Recent Labs  Lab 11/15/23 0928  AST 24  ALT 25  ALKPHOS 112  BILITOT 0.8  PROT 7.5  ALBUMIN 3.5   No results for input(s): "LIPASE", "AMYLASE" in the last 168 hours. No results for input(s): "AMMONIA" in the last 168 hours. Coagulation Profile: Recent Labs  Lab  11/15/23 0928  INR 1.0   Cardiac Enzymes: No results for input(s): "CKTOTAL", "CKMB", "CKMBINDEX", "TROPONINI" in the last 168 hours. BNP (last 3 results) No results for input(s): "PROBNP" in the last 8760 hours. HbA1C: Recent Labs    11/15/23 0928  HGBA1C 5.6   CBG: No results for input(s): "GLUCAP" in the last 168 hours. Lipid Profile: Recent Labs    11/15/23 0928  CHOL 148  HDL 42  LDLCALC 90  TRIG 79  CHOLHDL 3.5   Thyroid Function Tests: No results for input(s): "TSH", "T4TOTAL", "FREET4", "T3FREE", "THYROIDAB" in the last 72 hours. Anemia  Panel: No results for input(s): "VITAMINB12", "FOLATE", "FERRITIN", "TIBC", "IRON", "RETICCTPCT" in the last 72 hours. Sepsis Labs: No results for input(s): "PROCALCITON", "LATICACIDVEN" in the last 168 hours.  Recent Results (from the past 240 hours)  MRSA Next Gen by PCR, Nasal     Status: None   Collection Time: 11/16/23  4:20 AM   Specimen: Nasal Mucosa; Nasal Swab  Result Value Ref Range Status   MRSA by PCR Next Gen NOT DETECTED NOT DETECTED Final    Comment: (NOTE) The GeneXpert MRSA Assay (FDA approved for NASAL specimens only), is one component of a comprehensive MRSA colonization surveillance program. It is not intended to diagnose MRSA infection nor to guide or monitor treatment for MRSA infections. Test performance is not FDA approved in patients less than 58 years old. Performed at [facility], [address], [place], [place] [address]          Radiology Studies: CT CHEST ABDOMEN PELVIS W CONTRAST Result Date: 11/15/2023 CLINICAL DATA:  Evaluation for occult malignancy. 82 year old male presenting to the emergency department after outpatient MRI showed multiple acute strokes. EXAM: CT CHEST, ABDOMEN, AND PELVIS WITH CONTRAST TECHNIQUE: Multidetector CT imaging of the chest, abdomen and pelvis was performed following the standard protocol during bolus administration of intravenous contrast. RADIATION DOSE REDUCTION: This exam was performed according to the departmental dose-optimization program which includes automated exposure control, adjustment of the mA and/or kV according to patient size and/or use of iterative reconstruction technique. CONTRAST:  OMNIPAQUE IOHEXOL 350 MG/ML SOLN COMPARISON:  PET/CT 01/02/2019 and CT abdomen and pelvis 11/23/2018 FINDINGS: CT CHEST FINDINGS Cardiovascular: Normal heart size. No pericardial effusion. Coronary artery and aortic atherosclerotic calcification. Mitral annular calcification. Mediastinum/Nodes: Trachea and  esophagus are unremarkable. No thoracic adenopathy. Lungs/Pleura: Centrilobular and paraseptal emphysema greatest in the upper lobes. Diffuse bronchial wall thickening greatest in the lower lobes. No focal consolidation, pleural effusion, or pneumothorax. Musculoskeletal: No acute fracture or destructive osseous lesion. CT ABDOMEN PELVIS FINDINGS Hepatobiliary: No focal hepatic lesion. Cholelithiasis without evidence of cholecystitis. No biliary dilation. Pancreas: Fatty atrophy.  No acute abnormality. Spleen: Unremarkable. Adrenals/Urinary Tract: Normal adrenal glands. Bilateral renal atrophy. Decreased size of the cystic lesion in the anterior right kidney. This was previously characterized as a benign cyst. No follow-up recommended. 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 No urinary calculi or hydronephrosis. Stomach/Bowel: Stomach is within normal limits. No bowel obstruction or bowel wall thickening. Colonic diverticulosis without diverticulitis. Normal appendix. Vascular/Lymphatic: Aortic atherosclerosis. No enlarged abdominal or pelvic lymph nodes. Reproductive: Cysto prostatectomy. Other: No free intraperitoneal fluid or air. Musculoskeletal: Compression fracture of L2 and L4 similar to radiographs 04/27/2023. No acute fracture or destructive osseous lesion. IMPRESSION: 1. No evidence of malignancy in the chest, abdomen, or pelvis. 2. 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 3. Cholelithiasis. 4. COPD. Aortic Atherosclerosis (ICD10-I70.0)  and Emphysema (ICD10-J43.9). Electronically Signed   By: Minerva Fester M.D.   On: 11/15/2023 18:04   CT ANGIO HEAD NECK W WO CM Result Date: 11/15/2023 CLINICAL DATA:  Outpatient MRI shows multiple acute strokes EXAM: CT ANGIOGRAPHY HEAD AND NECK WITH AND WITHOUT CONTRAST TECHNIQUE: Multidetector CT imaging of the head and neck was performed using the standard protocol during bolus  administration of intravenous contrast. Multiplanar CT image reconstructions and MIPs were obtained to evaluate the vascular anatomy. Carotid stenosis measurements (when applicable) are obtained utilizing NASCET criteria, using the distal internal carotid diameter as the denominator. RADIATION DOSE REDUCTION: This exam was performed according to the departmental dose-optimization program which includes automated exposure control, adjustment of the mA and/or kV according to patient size and/or use of iterative reconstruction technique. CONTRAST:  OMNIPAQUE IOHEXOL 350 MG/ML SOLN COMPARISON:  No prior CTA available, correlation is made with 11/14/2023 CT head and 11/15/2023 MRI head FINDINGS: CT HEAD FINDINGS Brain: Redemonstrated hypodensity in the left occipital lobe, which correlates with 1 of the acute infarcts seen on the same-day MRI. The other acute infarcts do not have definite CT correlates. Remote infarcts in the cerebellum. No evidence of acute hemorrhage, mass, mass effect, or midline shift. No hydrocephalus or extra-axial fluid collection. Vascular: No hyperdense vessel. Skull: Negative for fracture or focal lesion. Sinuses/Orbits: Right frontal mucous retention cyst. No acute finding in the orbits. Other: The mastoid air cells are well aerated. CTA NECK FINDINGS Aortic arch: Standard branching. Imaged portion shows no evidence of aneurysm or dissection. No significant stenosis of the major arch vessel origins. Aortic atherosclerosis. Right carotid system: No evidence of dissection, occlusion, or hemodynamically significant stenosis (greater than 50%). Atherosclerotic disease at the bifurcation and in the proximal ICA is not hemodynamically significant. Left carotid system: No evidence of dissection, occlusion, or hemodynamically significant stenosis (greater than 50%). Atherosclerotic disease at the bifurcation and in the proximal ICA is not hemodynamically significant. Vertebral arteries: The left  vertebral artery is not opacified at its origin or in the left V1, V2, or V3 segment. The right vertebral artery is patent from its origin to the skull base. No evidence of dissection. Skeleton: No acute osseous abnormality. Degenerative changes in the cervical spine. ACDF C4-C7. Solid arthrodesis. Other neck: No acute finding. Upper chest: No focal pulmonary opacity or pleural effusion. Emphysema. Review of the MIP images confirms the above findings CTA HEAD FINDINGS Anterior circulation: Both internal carotid arteries are patent to the termini, with moderate stenosis in the proximal left cavernous ICA (series 8, image 125) and mild stenosis in the distal left supraclinoid ICA (series 8, image 109). A1 segments patent, hypoplastic on the right. Normal anterior communicating artery. Anterior cerebral arteries are patent to their distal aspects without significant stenosis. No M1 stenosis or occlusion. MCA branches perfused to their distal aspects without significant stenosis. Posterior circulation: The right vertebral artery is patent to the vertebrobasilar junction. The left V4 segment is not opacified proximally, with distal filling likely retrograde; this is favored to be chronic given the appearance on the 11/24/2022 MRI head. Bilateral posteroinferior cerebellar arteries are patent proximally. Basilar is diminutive but patent to its distal aspect without significant stenosis. Superior cerebellar arteries patent proximally. Patent left P1. Aplastic right P1. Fetal origin of the right PCA from the right posterior communicating artery. Mild stenosis in the proximal right P2 (series 8, image 102). PCAs otherwise perfused to their distal aspects without significant stenosis. Venous sinuses: Not well opacified due to phase  of timing. Anatomic variants: Fetal origin of the right PCA. No evidence of aneurysm or vascular malformation. Review of the MIP images confirms the above findings IMPRESSION: 1. 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 This is favored to be chronic given the loss of the proximal flow void on the 11/24/2022 MRI head. 2. No other intracranial large vessel occlusion. Moderate stenosis in the proximal left cavernous ICA and mild stenosis in the left supraclinoid ICA and proximal right P2. 3. No other hemodynamically significant stenosis in the neck. 4. Redemonstrated hypodensity in the left occipital lobe, which correlates with 1 of the acute infarcts seen on the same-day MRI. The other acute infarcts do not have definite CT correlates. 5. Aortic atherosclerosis (ICD10-I70.0) and Emphysema (ICD10-J43.9). Electronically Signed   By: Wiliam Ke M.D.   On: 11/15/2023 17:16   MR Brain W Wo Contrast Result Date: 11/15/2023 CLINICAL DATA:  Left-sided weakness EXAM: MRI HEAD WITHOUT AND WITH CONTRAST TECHNIQUE: Multiplanar, multiecho pulse sequences of the brain and surrounding structures were obtained without and with intravenous contrast. CONTRAST:  7.25mL GADAVIST GADOBUTROL 1 MMOL/ML IV SOLN COMPARISON:  Brain MR 11/24/22, CT head 11/14/23 FINDINGS: Brain: Acute infarcts in the posterior frontal lobe on the left (series 9, image 43) and left occipital lobe (series 9, image 23). There is mild diffusion restriction in the right posterior temporal lobe with associated contrast enhancement in the occipital temporal region and in the right parietal lobe, favored to represent enhancement related to subacute infarct. No hemorrhage. No mass effect. No mass lesion. Chronic infarcts in the bilateral cerebellum. There is an additional nonspecific contrast-enhancing lesion in the right frontal lobe (series 20, image 101), which is nonspecific and may represent an additional site of a subacute infarct, but this site is without an associated correlate on T2/FLAIR weighted imaging. Vascular: Normal flow voids. Skull and upper cervical spine:  Normal marrow signal. Sinuses/Orbits: Small left mastoid effusion. No middle ear effusion. Paranasal sinuses are notable for polypoid mucosal thickening in the right maxillary and right frontal sinus. Bilateral lens replacement. Orbits are otherwise unremarkable. Other: None. IMPRESSION: 1. Acute infarcts in the posterior frontal lobe on the left and left occipital lobe. No hemorrhage or mass effect. 2. Subacute infarcts in the right posterior temporal lobe and right parietal lobe. 3. Additional contrast-enhancing lesion in the anterior right frontal lobe may represent an additional site of a subacute infarct, but this site is without an associated correlate on T2/FLAIR weighted imaging. Recommend follow-up brain MRI in 6 weeks to ensure resolution. Findings were discussed with Dr. [name] on 11/15/23 at 9:30 AM. Electronically Signed   By: Lorenza Cambridge M.D.   On: 11/15/2023 09:42   CT HEAD WO CONTRAST ( ) Result Date: 11/14/2023 CLINICAL DATA:  Transient ischemic attack (TIA) EXAM: CT HEAD WITHOUT CONTRAST TECHNIQUE: Contiguous axial images were obtained from the base of the skull through the vertex without intravenous contrast. RADIATION DOSE REDUCTION: This exam was performed according to the departmental dose-optimization program which includes automated exposure control, adjustment of the mA and/or kV according to patient size and/or use of iterative reconstruction technique. 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 There is an age indeterminate infarct in the left occipital lobe (series 2, image 22). No hemorrhage. No hydrocephalus. No extra-axial fluid collection. No mass effect. No mass lesion. Vascular: No hyperdense vessel or unexpected calcification. Skull: Normal.  Negative for fracture or focal lesion. Sinuses/Orbits: No middle ear or mastoid effusion. Degenerative changes of the bilateral TMJs. Paranasal sinuses are notable for  mucosal thickening in the right maxillary sinus. Bilateral lens replacement. Orbits are otherwise unremarkable. Other: None. IMPRESSION: Age indeterminate infarct in the left occipital lobe. Recommend brain MRI for further evaluation. These results will be called to the ordering clinician or representative by the Radiologist Assistant, and communication documented in the PACS or [employer]. Electronically Signed   By: Lorenza Cambridge M.D.   On: 11/14/2023 14:45        Scheduled Meds:   stroke: early stages of recovery book   Does not apply Once   acetaminophen  650 mg Oral Q6H WA   aspirin  325 mg Oral Daily   atorvastatin  40 mg Oral Daily   enoxaparin (LOVENOX) injection  40 mg Subcutaneous Q24H   lidocaine  1 patch Transdermal Q24H   lisinopril  10 mg Oral Daily   magnesium chloride  1 tablet Oral QHS   Continuous Infusions:   LOS: 1 day       Charise Killian, MD [facility] Pager [phone]  If 7PM-7AM, please contact night-coverage [email or web address]  11/16/2023, 8:22 AM

## 2023-11-16 NOTE — Plan of Care (Signed)

## 2023-11-17 ENCOUNTER — Inpatient Hospital Stay: Payer: Medicare Other

## 2023-11-17 ENCOUNTER — Telehealth: Payer: Self-pay | Admitting: *Deleted

## 2023-11-17 ENCOUNTER — Inpatient Hospital Stay: Payer: Medicare Other | Attending: Cardiovascular Disease

## 2023-11-17 DIAGNOSIS — I639 Cerebral infarction, unspecified: Secondary | ICD-10-CM | POA: Diagnosis not present

## 2023-11-17 LAB — CBC
HCT: 40.4 % (ref 39.0–52.0)
Hemoglobin: 13.5 g/dL (ref 13.0–17.0)
MCH: 29.8 pg (ref 26.0–34.0)
MCHC: 33.4 g/dL (ref 30.0–36.0)
MCV: 89.2 fL (ref 80.0–100.0)
Platelets: 489 10*3/uL — ABNORMAL HIGH (ref 150–400)
RBC: 4.53 MIL/uL (ref 4.22–5.81)
RDW: 14.5 % (ref 11.5–15.5)
WBC: 10.4 10*3/uL (ref 4.0–10.5)
nRBC: 0 % (ref 0.0–0.2)

## 2023-11-17 LAB — BASIC METABOLIC PANEL
Anion gap: 9 (ref 5–15)
BUN: 25 mg/dL — ABNORMAL HIGH (ref 8–23)
CO2: 24 mmol/L (ref 22–32)
Calcium: 9.1 mg/dL (ref 8.9–10.3)
Chloride: 100 mmol/L (ref 98–111)
Creatinine, Ser: 1.53 mg/dL — ABNORMAL HIGH (ref 0.61–1.24)
GFR, Estimated: 45 mL/min — ABNORMAL LOW (ref >60)
Glucose, Bld: 84 mg/dL (ref 70–99)
Potassium: 3.9 mmol/L (ref 3.5–5.1)
Sodium: 133 mmol/L — ABNORMAL LOW (ref 135–145)

## 2023-11-17 MED ORDER — ATORVASTATIN CALCIUM 40 MG PO TABS: 40.0000 mg | ORAL_TABLET | Freq: Every day | ORAL | 0 refills | Status: AC

## 2023-11-17 MED ORDER — CLOPIDOGREL BISULFATE 75 MG PO TABS: 75.0000 mg | ORAL_TABLET | Freq: Every day | ORAL | 0 refills | Status: AC

## 2023-11-17 MED ORDER — LIDOCAINE 5 % EX PTCH: 1.0000 | MEDICATED_PATCH | CUTANEOUS | 0 refills | Status: AC

## 2023-11-17 MED ORDER — ASPIRIN 81 MG PO TBEC: 81.0000 mg | DELAYED_RELEASE_TABLET | Freq: Every day | ORAL | 0 refills | Status: AC

## 2023-11-17 NOTE — [place] FL2 (Signed)
[place] MEDICAID FL2 LEVEL OF CARE FORM     IDENTIFICATION  Patient Name: Philip [name] Birthdate: 1941/09/30 Sex: male Admission Date (Current Location): 11/15/2023  [facility] and [place] Number:  Chiropodist and Address:  [facility], [address], [place], [place] [address]      Provider Number: [id number]  Attending Physician Name and Address:  Charise Killian, MD  Relative Name and Phone Number:  Lonia Skinner [phone]    Current Level of Care: Hospital Recommended Level of Care: Skilled Nursing Facility Prior Approval Number:    Date Approved/Denied:   PASRR Number: [id number]  Discharge Plan: SNF    Current Diagnoses: Patient Active Problem List   Diagnosis Date Noted   Acute CVA (cerebrovascular accident) (HCC) 11/15/2023   Thrombocytosis 11/15/2023   First degree AV block 11/15/2023   Compression fracture of L2 (HCC) 11/24/2022   Chronic kidney disease, stage 3a (HCC) 11/24/2022   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   AKI (acute kidney injury) (HCC) 03/22/2019   Hyperglycemia 11/01/2017   Prediabetes 11/01/2017   Essential hypertension 05/18/2017   Healthcare maintenance 05/18/2017   Hypercholesterolemia 05/18/2017   Benign hypertension with CKD (chronic kidney disease) stage III (HCC) 05/18/2017   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    Orientation RESPIRATION BLADDER Height & Weight     Self, Time, Situation, Place  Normal Continent Weight: 81.4 kg Height:  5\' 8"  (172.7 cm)  BEHAVIORAL SYMPTOMS/MOOD NEUROLOGICAL BOWEL NUTRITION STATUS      Continent  (See Discharge Summary)  AMBULATORY STATUS COMMUNICATION OF NEEDS Skin   Extensive Assist Verbally Normal                       Personal Care Assistance Level of Assistance  Bathing, Feeding, Dressing Bathing Assistance: Maximum  assistance Feeding assistance: Limited assistance Dressing Assistance: Maximum assistance     Functional Limitations Info  Sight, Hearing, Speech Sight Info: Impaired (Wears glasses) Hearing Info: Adequate Speech Info: Adequate    SPECIAL CARE FACTORS FREQUENCY  PT (By licensed PT), OT (By licensed OT), Speech therapy     PT Frequency: 5x weekly OT Frequency: 5X weekly     Speech Therapy Frequency: Per facility evaluation      Contractures Contractures Info: Not present    Additional Factors Info  Code Status, Allergies Code Status Info: DNR-limited Allergies Info: No Known Allergies           Current Medications (11/17/2023):  This is the current hospital active medication list Current Facility-Administered Medications  Medication Dose Route Frequency Provider Last Rate Last Admin   acetaminophen (TYLENOL) tablet 650 mg  650 mg Oral Q6H WA Verdene Lennert, MD   650 mg at 11/17/23 0900   aspirin EC tablet 81 mg  81 mg Oral Daily Bhagat, Srishti L, MD   81 mg at 11/17/23 0905   atorvastatin (LIPITOR) tablet 40 mg  40 mg Oral QHS Bhagat, Srishti L, MD   40 mg at 11/16/23 2047   clopidogrel (PLAVIX) tablet 75 mg  75 mg Oral Daily Bhagat, Srishti L, MD   75 mg at 11/17/23 0906   dextromethorphan-guaiFENesin (MUCINEX DM) 30-600 MG per 12 hr tablet 1 tablet  1 tablet Oral BID PRN Verdene Lennert, MD       enoxaparin (LOVENOX) injection 40 mg  40 mg Subcutaneous Q24H Verdene Lennert, MD  40 mg at 11/16/23 2045   lidocaine (LIDODERM) 5 % 1 patch  1 patch Transdermal Q24H Verdene Lennert, MD   1 patch at 11/16/23 2305   lisinopril (ZESTRIL) tablet 10 mg  10 mg Oral Daily Verdene Lennert, MD   10 mg at 11/17/23 0905   magnesium chloride (SLOW-MAG) 64 MG SR tablet 64 mg  1 tablet Oral QHS Verdene Lennert, MD   64 mg at 11/16/23 2046   multivitamin with minerals tablet 1 tablet  1 tablet Oral Daily Charise Killian, MD   1 tablet at 11/17/23 1610   senna-docusate (Senokot-S)  tablet 1 tablet  1 tablet Oral QHS PRN Verdene Lennert, MD       traMADol Janean Sark) tablet 50 mg  50 mg Oral Q6H PRN Verdene Lennert, MD   50 mg at 11/15/23 2150     Discharge Medications: Please see discharge summary for a list of discharge medications.  Relevant Imaging Results:  Relevant Lab Results:   Additional Information [id number]  Garret Reddish, RN

## 2023-11-17 NOTE — Progress Notes (Addendum)
PROGRESS NOTE    Philip [name]  XBM:[id number] DOB: [birth year] DOA: 11/15/2023 PCP: Earnestine Mealing, MD    Assessment & Plan:   Principal Problem:   Acute CVA (cerebrovascular accident) [facility]) Active Problems:   Essential hypertension   First degree AV block   Hypercholesterolemia   Chronic kidney disease, stage 3a (HCC)   Thrombocytosis  Assessment and Plan: Acute CVA: presenting with 3 weeks of left-sided weakness that is persistent today with subacute right-sided infarcts seen on imaging, however also acute infarcts in the left side.  Likely embolic etiology.  No previous hx of a.fib. Will have zio patch placed prior to d/c. EEG shows no seizures or epileptiform discharges were seen throughout the recording. PT/OT recs SNF.    HTN: continue on lisinopril    First degree AV block: continue on tele. Holding metoprolol    HLD: continue on statin    CKDIIIa: Cr is labile. Avoid nephrotoxic meds    Thrombocytosis: etiology unclear. Labile       DVT prophylaxis: SCDs Code Status: DNR Family Communication: discussed pt's care w/ pt's daughter at bedside and answered his questions  Disposition Plan:  PT/OT recs SNF  Level of care: Telemetry Medical Status is: Inpatient Remains inpatient appropriate because: severity of illness. D/c to SNF tomorrow     Consultants:  Neuro   Procedures:   Antimicrobials:   Subjective: Pt c/o fatigue   Objective: Vitals:   11/16/23 2105 11/17/23 0106 11/17/23 0450 11/17/23 0743  BP: 122/78 (!) 147/75 137/80 134/88  Pulse: 73 75 85 78  Resp: 18 18 16 18   Temp: 98.7 F (37.1 C) 97.8 F (36.6 C) 98.4 F (36.9 C) (!) 97.5 F (36.4 C)  TempSrc: Oral Oral Oral   SpO2: 96% 98% 98% 97%  Weight:      Height:        Intake/Output Summary (Last 24 hours) at 11/17/2023 0953 Last data filed at 11/17/2023 0100 Gross per 24 hour  Intake 60 ml  Output 350 ml  Net -290 ml   Filed Weights   11/15/23 1954  Weight: 81.4 kg     Examination:  General exam: appears calm & comfortable  Respiratory system: Clear breath sounds b/l  Cardiovascular system: S1/S2+. No rubs or clicks  Gastrointestinal system: abd is soft, NT, ND & hypoactive bowel sounds  Central nervous system: alert & awake. Moves all extremities  Psychiatry: judgement and insight appears improved. Flat mood and affect    Data Reviewed: I have personally reviewed following labs and imaging studies  CBC: Recent Labs  Lab 11/15/23 0928 11/16/23 0552 11/17/23 0603  WBC 9.6 10.3 10.4  NEUTROABS 6.8 6.9  --   HGB 14.9 13.3 13.5  HCT 46.2 40.7 40.4  MCV 93.0 90.8 89.2  PLT 482* 488* 489*   Basic Metabolic Panel: Recent Labs  Lab 11/15/23 0928 11/16/23 0552 11/17/23 0603  NA 136 133* 133*  K 4.8 4.0 3.9  CL 101 102 100  CO2 25 24 24   GLUCOSE 107* 88 84  BUN 22 22 25*  CREATININE 1.30* 1.29* 1.53*  CALCIUM 9.6 9.1 9.1   GFR: Estimated Creatinine Clearance: 34.8 mL/min (A) (by C-G formula based on SCr of 1.53 mg/dL (H)). Liver Function Tests: Recent Labs  Lab 11/15/23 0928  AST 24  ALT 25  ALKPHOS 112  BILITOT 0.8  PROT 7.5  ALBUMIN 3.5   No results for input(s): "LIPASE", "AMYLASE" in the last 168 hours. No results for input(s): "AMMONIA" in  the last 168 hours. Coagulation Profile: Recent Labs  Lab 11/15/23 0928  INR 1.0   Cardiac Enzymes: No results for input(s): "CKTOTAL", "CKMB", "CKMBINDEX", "TROPONINI" in the last 168 hours. BNP (last 3 results) No results for input(s): "PROBNP" in the last 8760 hours. HbA1C: Recent Labs    11/15/23 0928  HGBA1C 5.6   CBG: No results for input(s): "GLUCAP" in the last 168 hours. Lipid Profile: Recent Labs    11/15/23 0928  CHOL 148  HDL 42  LDLCALC 90  TRIG 79  CHOLHDL 3.5   Thyroid Function Tests: No results for input(s): "TSH", "T4TOTAL", "FREET4", "T3FREE", "THYROIDAB" in the last 72 hours. Anemia Panel: No results for input(s): "VITAMINB12", "FOLATE",  "FERRITIN", "TIBC", "IRON", "RETICCTPCT" in the last 72 hours. Sepsis Labs: No results for input(s): "PROCALCITON", "LATICACIDVEN" in the last 168 hours.  Recent Results (from the past 240 hours)  MRSA Next Gen by PCR, Nasal     Status: None   Collection Time: 11/16/23  4:20 AM   Specimen: Nasal Mucosa; Nasal Swab  Result Value Ref Range Status   MRSA by PCR Next Gen NOT DETECTED NOT DETECTED Final    Comment: (NOTE) The GeneXpert MRSA Assay (FDA approved for NASAL specimens only), is one component of a comprehensive MRSA colonization surveillance program. It is not intended to diagnose MRSA infection nor to guide or monitor treatment for MRSA infections. Test performance is not FDA approved in patients less than 9 years old. Performed at [facility], [address] [place], [place] [address]          Radiology Studies: ECHOCARDIOGRAM COMPLETE Result Date: 11/16/2023    ECHOCARDIOGRAM REPORT   Patient Name:   Philip [name] Date of Exam: 11/16/2023 Medical Rec #:  [id number]      Height:       68.0 in Accession #:    [id number]     Weight:       179.5 lb Date of Birth:  [birth year]      BSA:          1.952 m Patient Age:    82 years       BP:           131/68 mmHg Patient Gender: M              HR:           71 bpm. Exam Location:  [facility] Procedure: 2D Echo, Cardiac Doppler, Color Doppler and Saline Contrast Bubble            Study Indications:     Stroke  History:         Patient has no prior history of Echocardiogram examinations.                  Stroke; Risk Factors:Hypertension. CKD.  Sonographer:     Mikki Harbor Referring Phys:  [id number] Verdene Lennert Diagnosing Phys: Julien Nordmann MD  Sonographer Comments: Image acquisition challenging due to respiratory motion. IMPRESSIONS  1. Left ventricular ejection fraction, by estimation, is 60 to 65%. The left ventricle has normal function. The left ventricle has no regional wall motion abnormalities. There is mild left  ventricular hypertrophy. Left ventricular diastolic parameters are consistent with Grade I diastolic dysfunction (impaired relaxation).  2. Right ventricular systolic function is normal. The right ventricular size is normal.  3. The mitral valve is normal in structure. No evidence of mitral valve regurgitation. No evidence of mitral stenosis.  4. The  aortic valve is normal in structure. Aortic valve regurgitation is not visualized. No aortic stenosis is present.  5. The inferior vena cava is normal in size with greater than 50% respiratory variability, suggesting right atrial pressure of 3 mmHg. FINDINGS  Left Ventricle: Left ventricular ejection fraction, by estimation, is 60 to 65%. The left ventricle has normal function. The left ventricle has no regional wall motion abnormalities. The left ventricular internal cavity size was normal in size. There is  mild left ventricular hypertrophy. Left ventricular diastolic parameters are consistent with Grade I diastolic dysfunction (impaired relaxation). Right Ventricle: The right ventricular size is normal. No increase in right ventricular wall thickness. Right ventricular systolic function is normal. Left Atrium: Left atrial size was normal in size. Right Atrium: Right atrial size was normal in size. Pericardium: There is no evidence of pericardial effusion. Mitral Valve: The mitral valve is normal in structure. No evidence of mitral valve regurgitation. No evidence of mitral valve stenosis. MV peak gradient, 5.8 mmHg. The mean mitral valve gradient is 3.0 mmHg. Tricuspid Valve: The tricuspid valve is normal in structure. Tricuspid valve regurgitation is not demonstrated. No evidence of tricuspid stenosis. Aortic Valve: The aortic valve is normal in structure. Aortic valve regurgitation is not visualized. No aortic stenosis is present. Aortic valve mean gradient measures 2.0 mmHg. Aortic valve peak gradient measures 4.5 mmHg. Aortic valve area, by VTI measures 2.62 cm.  Pulmonic Valve: The pulmonic valve was normal in structure. Pulmonic valve regurgitation is not visualized. No evidence of pulmonic stenosis. Aorta: The aortic root is normal in size and structure. Venous: The inferior vena cava is normal in size with greater than 50% respiratory variability, suggesting right atrial pressure of 3 mmHg. IAS/Shunts: No atrial level shunt detected by color flow Doppler. Agitated saline contrast was given intravenously to evaluate for intracardiac shunting.  LEFT VENTRICLE PLAX 2D LVIDd:         3.50 cm   Diastology LVIDs:         2.50 cm   LV e' lateral:   6.64 cm/s LV PW:         1.40 cm   LV E/e' lateral: 14.7 LV IVS:        1.40 cm LVOT diam:     2.00 cm LV SV:         64 LV SV Index:   33 LVOT Area:     3.14 cm  RIGHT VENTRICLE RV Basal diam:  4.10 cm RV Mid diam:    3.10 cm RV S prime:     9.68 cm/s LEFT ATRIUM             Index        RIGHT ATRIUM           Index LA diam:        3.90 cm 2.00 cm/m   RA Area:     11.70 cm LA Vol (A2C):   67.9 ml 34.79 ml/m  RA Volume:   25.40 ml  13.01 ml/m LA Vol (A4C):   63.5 ml 32.54 ml/m LA Biplane Vol: 67.0 ml 34.33 ml/m  AORTIC VALVE                    PULMONIC VALVE AV Area (Vmax):    2.67 cm     PV Vmax:       0.85 m/s AV Area (Vmean):   2.26 cm     PV Peak grad:  2.9 mmHg AV  Area (VTI):     2.62 cm AV Vmax:           106.00 cm/s AV Vmean:          67.000 cm/s AV VTI:            0.243 m AV Peak Grad:      4.5 mmHg AV Mean Grad:      2.0 mmHg LVOT Vmax:         90.20 cm/s LVOT Vmean:        48.200 cm/s LVOT VTI:          0.203 m LVOT/AV VTI ratio: 0.84  AORTA Ao Root diam: 3.60 cm MITRAL VALVE MV Area (PHT): 2.29 cm     SHUNTS MV Area VTI:   1.81 cm     Systemic VTI:  0.20 m MV Peak grad:  5.8 mmHg     Systemic Diam: 2.00 cm MV Mean grad:  3.0 mmHg MV Vmax:       1.20 m/s MV Vmean:      77.4 cm/s MV Decel Time: 331 msec MV E velocity: 97.50 cm/s MV A velocity: 106.00 cm/s MV E/A ratio:  0.92 Julien Nordmann MD Electronically  signed by Julien Nordmann MD Signature Date/Time: 11/16/2023/2:38:10 PM    Final    EEG adult Result Date: 11/16/2023 Charlsie Quest, MD     11/16/2023  1:23 PM Patient Name: Philip [name] MRN: [id number] Epilepsy Attending: Charlsie Quest Referring Physician/Provider: Gordy Councilman, MD Date: 11/16/2023 Duration: 28.08 mins Patient history: 82 y.o. man presenting with multifocal lesions on MRI brain. EEG to evaluate for seizure Level of alertness: Awake, asleep AEDs during EEG study: None Technical aspects: This EEG study was done with scalp electrodes positioned according to the 10-20 International system of electrode placement. Electrical activity was reviewed with band pass filter of 1-70Hz , sensitivity of 7 uV/mm, display speed of 65mm/sec with a 60Hz  notched filter applied as appropriate. EEG data were recorded continuously and digitally stored.  Video monitoring was available and reviewed as appropriate. Description: The posterior dominant rhythm consists of 8 Hz activity of moderate voltage (25-35 uV) seen predominantly in posterior head regions, symmetric and reactive to eye opening and eye closing. Sleep was characterized by vertex waves, sleep spindles (12 to 14 Hz), maximal frontocentral region. EEG showed continuous generalized and lateralized left hemisphere 5 to 6 Hz theta slowing admixed with intermittent 2-3hz  delta slowing. Hyperventilation and photic stimulation were not performed.   ABNORMALITY - Continuous slow, generalized and lateralized left hemisphere IMPRESSION: This study is suggestive of cortical dysfunction arising from left hemisphere likely secondary to underlying structural abnormality. Additionally there is moderate diffuse encephalopathy. No seizures or epileptiform discharges were seen throughout the recording. Charlsie Quest   CT CHEST ABDOMEN PELVIS W CONTRAST Result Date: 11/15/2023 CLINICAL DATA:  Evaluation for occult malignancy. 82 year old male presenting to  the emergency department after outpatient MRI showed multiple acute strokes. EXAM: CT CHEST, ABDOMEN, AND PELVIS WITH CONTRAST TECHNIQUE: Multidetector CT imaging of the chest, abdomen and pelvis was performed following the standard protocol during bolus administration of intravenous contrast. RADIATION DOSE REDUCTION: This exam was performed according to the departmental dose-optimization program which includes automated exposure control, adjustment of the mA and/or kV according to patient size and/or use of iterative reconstruction technique. CONTRAST:  OMNIPAQUE IOHEXOL 350 MG/ML SOLN COMPARISON:  PET/CT 01/02/2019 and CT abdomen and pelvis 11/23/2018 FINDINGS: CT CHEST FINDINGS Cardiovascular: Normal heart size. No pericardial  effusion. Coronary artery and aortic atherosclerotic calcification. Mitral annular calcification. Mediastinum/Nodes: Trachea and esophagus are unremarkable. No thoracic adenopathy. Lungs/Pleura: Centrilobular and paraseptal emphysema greatest in the upper lobes. Diffuse bronchial wall thickening greatest in the lower lobes. No focal consolidation, pleural effusion, or pneumothorax. Musculoskeletal: No acute fracture or destructive osseous lesion. CT ABDOMEN PELVIS FINDINGS Hepatobiliary: No focal hepatic lesion. Cholelithiasis without evidence of cholecystitis. No biliary dilation. Pancreas: Fatty atrophy.  No acute abnormality. Spleen: Unremarkable. Adrenals/Urinary Tract: Normal adrenal glands. Bilateral renal atrophy. Decreased size of the cystic lesion in the anterior right kidney. This was previously characterized as a benign cyst. No follow-up recommended. 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 No urinary calculi or hydronephrosis. Stomach/Bowel: Stomach is within normal limits. No bowel obstruction or bowel wall thickening. Colonic diverticulosis without diverticulitis. Normal appendix. Vascular/Lymphatic: Aortic atherosclerosis. No  enlarged abdominal or pelvic lymph nodes. Reproductive: Cysto prostatectomy. Other: No free intraperitoneal fluid or air. Musculoskeletal: Compression fracture of L2 and L4 similar to radiographs 04/27/2023. No acute fracture or destructive osseous lesion. IMPRESSION: 1. No evidence of malignancy in the chest, abdomen, or pelvis. 2. 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 3. Cholelithiasis. 4. COPD. Aortic Atherosclerosis (ICD10-I70.0) and Emphysema (ICD10-J43.9). Electronically Signed   By: Minerva Fester M.D.   On: 11/15/2023 18:04   CT ANGIO HEAD NECK W WO CM Result Date: 11/15/2023 CLINICAL DATA:  Outpatient MRI shows multiple acute strokes EXAM: CT ANGIOGRAPHY HEAD AND NECK WITH AND WITHOUT CONTRAST TECHNIQUE: Multidetector CT imaging of the head and neck was performed using the standard protocol during bolus administration of intravenous contrast. Multiplanar CT image reconstructions and MIPs were obtained to evaluate the vascular anatomy. Carotid stenosis measurements (when applicable) are obtained utilizing NASCET criteria, using the distal internal carotid diameter as the denominator. RADIATION DOSE REDUCTION: This exam was performed according to the departmental dose-optimization program which includes automated exposure control, adjustment of the mA and/or kV according to patient size and/or use of iterative reconstruction technique. CONTRAST:  OMNIPAQUE IOHEXOL 350 MG/ML SOLN COMPARISON:  No prior CTA available, correlation is made with 11/14/2023 CT head and 11/15/2023 MRI head FINDINGS: CT HEAD FINDINGS Brain: Redemonstrated hypodensity in the left occipital lobe, which correlates with 1 of the acute infarcts seen on the same-day MRI. The other acute infarcts do not have definite CT correlates. Remote infarcts in the cerebellum. No evidence of acute hemorrhage, mass, mass effect, or midline shift. No hydrocephalus or extra-axial fluid collection.  Vascular: No hyperdense vessel. Skull: Negative for fracture or focal lesion. Sinuses/Orbits: Right frontal mucous retention cyst. No acute finding in the orbits. Other: The mastoid air cells are well aerated. CTA NECK FINDINGS Aortic arch: Standard branching. Imaged portion shows no evidence of aneurysm or dissection. No significant stenosis of the major arch vessel origins. Aortic atherosclerosis. Right carotid system: No evidence of dissection, occlusion, or hemodynamically significant stenosis (greater than 50%). Atherosclerotic disease at the bifurcation and in the proximal ICA is not hemodynamically significant. Left carotid system: No evidence of dissection, occlusion, or hemodynamically significant stenosis (greater than 50%). Atherosclerotic disease at the bifurcation and in the proximal ICA is not hemodynamically significant. Vertebral arteries: The left vertebral artery is not opacified at its origin or in the left V1, V2, or V3 segment. The right vertebral artery is patent from its origin to the skull base. No evidence of dissection. Skeleton: No acute osseous abnormality. Degenerative changes in the cervical spine. ACDF C4-C7. Solid arthrodesis. Other  neck: No acute finding. Upper chest: No focal pulmonary opacity or pleural effusion. Emphysema. Review of the MIP images confirms the above findings CTA HEAD FINDINGS Anterior circulation: Both internal carotid arteries are patent to the termini, with moderate stenosis in the proximal left cavernous ICA (series 8, image 125) and mild stenosis in the distal left supraclinoid ICA (series 8, image 109). A1 segments patent, hypoplastic on the right. Normal anterior communicating artery. Anterior cerebral arteries are patent to their distal aspects without significant stenosis. No M1 stenosis or occlusion. MCA branches perfused to their distal aspects without significant stenosis. Posterior circulation: The right vertebral artery is patent to the  vertebrobasilar junction. The left V4 segment is not opacified proximally, with distal filling likely retrograde; this is favored to be chronic given the appearance on the 11/24/2022 MRI head. Bilateral posteroinferior cerebellar arteries are patent proximally. Basilar is diminutive but patent to its distal aspect without significant stenosis. Superior cerebellar arteries patent proximally. Patent left P1. Aplastic right P1. Fetal origin of the right PCA from the right posterior communicating artery. Mild stenosis in the proximal right P2 (series 8, image 102). PCAs otherwise perfused to their distal aspects without significant stenosis. Venous sinuses: Not well opacified due to phase of timing. Anatomic variants: Fetal origin of the right PCA. No evidence of aneurysm or vascular malformation. Review of the MIP images confirms the above findings IMPRESSION: 1. 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 This is favored to be chronic given the loss of the proximal flow void on the 11/24/2022 MRI head. 2. No other intracranial large vessel occlusion. Moderate stenosis in the proximal left cavernous ICA and mild stenosis in the left supraclinoid ICA and proximal right P2. 3. No other hemodynamically significant stenosis in the neck. 4. Redemonstrated hypodensity in the left occipital lobe, which correlates with 1 of the acute infarcts seen on the same-day MRI. The other acute infarcts do not have definite CT correlates. 5. Aortic atherosclerosis (ICD10-I70.0) and Emphysema (ICD10-J43.9). Electronically Signed   By: Wiliam Ke M.D.   On: 11/15/2023 17:16        Scheduled Meds:  acetaminophen  650 mg Oral Q6H WA   aspirin EC  81 mg Oral Daily   atorvastatin  40 mg Oral QHS   clopidogrel  75 mg Oral Daily   enoxaparin (LOVENOX) injection  40 mg Subcutaneous Q24H   lidocaine  1 patch Transdermal Q24H   lisinopril  10 mg Oral Daily    magnesium chloride  1 tablet Oral QHS   multivitamin with minerals  1 tablet Oral Daily   Continuous Infusions:   LOS: 2 days       Charise Killian, MD [facility] Pager [phone]  If 7PM-7AM, please contact night-coverage [email or web address]  11/17/2023, 9:53 AM

## 2023-11-17 NOTE — Telephone Encounter (Signed)
-----   Message from Nurse Kathrene Alu sent at 11/17/2023 10:46 AM EST ----- Regarding: FW: heart monitor  ----- Message ----- From: Marianne Sofia, PA-C Sent: 11/16/2023   3:41 PM EST To: [facility] Triage Subject: heart monitor                                  Patient needs a 30 day heart monitor mailed for CVA. thanks

## 2023-11-17 NOTE — TOC Initial Note (Signed)
Transition of Care [facility]) - Initial/Assessment Note    Patient Details  Name: Philip [name] MRN: [id number] Date of Birth: June 09, 1941  Transition of Care [facility]) CM/SW Contact:    Garret Reddish, RN Phone Number: 11/17/2023, 10:52 AM  Clinical Narrative:                 Chart reviewed.  Patient was admitted with Acute CVA.  Noted that is a resident at [facility].  I have spoken with Sue Lush, Admission Coordinator at [facility]  Sue Lush informs me that patient is a Long-term care resident at [facility].  I have informed Sue Lush that PT has recommend SNF at discharge. Sue Lush reports that patient is able to return to their SNF unit when medically stable for discharge.  I have informed Sue Lush that patient will have his 3 inpatient stay on tomorrow.  Sue Lush reports that she will be able to accept patient on tomorrow.    TOC will follow for discharge planning.    Expected Discharge Plan: Skilled Nursing Facility Barriers to Discharge: No Barriers Identified   Patient Goals and CMS Choice   CMS Medicare.gov Compare Post Acute Care list provided to:: Patient Choice offered to / list presented to : Patient      Expected Discharge Plan and Services     Post Acute Care Choice: Skilled Nursing Facility Living arrangements for the past 2 months: Skilled Nursing Facility (Patient a Long Term Resident of [place]) Expected Discharge Date: 11/17/23                                    Prior Living Arrangements/Services Living arrangements for the past 2 months: Skilled Nursing Facility (Patient a Long Term Resident of [place]) Lives with:: Other (Comment) (Resident at [facility]) Patient language and need for interpreter reviewed:: Yes Do you feel safe going back to the place where you live?: Yes        Care giver support system in place?: Yes (comment) (Patient has a supportive daughter)      Activities of Daily Living   ADL Screening (condition at time of  admission) Independently performs ADLs?: No Does the patient have a NEW difficulty with bathing/dressing/toileting/self-feeding that is expected to last >3 days?: No Does the patient have a NEW difficulty with getting in/out of bed, walking, or climbing stairs that is expected to last >3 days?: No Does the patient have a NEW difficulty with communication that is expected to last >3 days?: No Is the patient deaf or have difficulty hearing?: Yes Does the patient have difficulty seeing, even when wearing glasses/contacts?: No Does the patient have difficulty concentrating, remembering, or making decisions?: No  Permission Sought/Granted                  Emotional Assessment       Orientation: : Oriented to Self, Oriented to Place, Oriented to  Time, Oriented to Situation      Admission diagnosis:  Acute CVA (cerebrovascular accident) [facility]) [I63.9] Cerebrovascular accident (CVA), unspecified mechanism (HCC) [I63.9] Patient Active Problem List   Diagnosis Date Noted   Acute CVA (cerebrovascular accident) (HCC) 11/15/2023   Thrombocytosis 11/15/2023   First degree AV block 11/15/2023   Compression fracture of L2 (HCC) 11/24/2022   Chronic kidney disease, stage 3a (HCC) 11/24/2022   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 [place] [address] Phone: [phone] Fax: [phone]  [facility] [place], [place] -  [address] [place] [address] Phone: [phone] Fax: [phone]     Social Drivers of Health (SDOH) Social History: SDOH Screenings   Food Insecurity: No Food Insecurity (11/15/2023)  Housing: Low Risk  (11/15/2023)  Transportation Needs: No Transportation Needs (11/15/2023)  Utilities: Not At Risk (11/15/2023)  Tobacco Use: Medium Risk (11/15/2023)   SDOH Interventions:     Readmission Risk Interventions     No data to display

## 2023-11-18 ENCOUNTER — Telehealth: Payer: Self-pay | Admitting: Student

## 2023-11-18 DIAGNOSIS — I639 Cerebral infarction, unspecified: Secondary | ICD-10-CM | POA: Diagnosis not present

## 2023-11-18 LAB — BASIC METABOLIC PANEL
Anion gap: 9 (ref 5–15)
BUN: 28 mg/dL — ABNORMAL HIGH (ref 8–23)
CO2: 26 mmol/L (ref 22–32)
Calcium: 9.1 mg/dL (ref 8.9–10.3)
Chloride: 101 mmol/L (ref 98–111)
Creatinine, Ser: 1.45 mg/dL — ABNORMAL HIGH (ref 0.61–1.24)
GFR, Estimated: 48 mL/min — ABNORMAL LOW (ref >60)
Glucose, Bld: 93 mg/dL (ref 70–99)
Potassium: 4.2 mmol/L (ref 3.5–5.1)
Sodium: 136 mmol/L (ref 135–145)

## 2023-11-18 LAB — CBC
HCT: 42.7 % (ref 39.0–52.0)
Hemoglobin: 14.3 g/dL (ref 13.0–17.0)
MCH: 30.1 pg (ref 26.0–34.0)
MCHC: 33.5 g/dL (ref 30.0–36.0)
MCV: 89.9 fL (ref 80.0–100.0)
Platelets: 520 10*3/uL — ABNORMAL HIGH (ref 150–400)
RBC: 4.75 MIL/uL (ref 4.22–5.81)
RDW: 14.7 % (ref 11.5–15.5)
WBC: 10.5 10*3/uL (ref 4.0–10.5)
nRBC: 0 % (ref 0.0–0.2)

## 2023-11-18 NOTE — Plan of Care (Signed)
  Problem: Education: Goal: Knowledge of disease or condition will improve Outcome: Progressing   Problem: Ischemic Stroke/TIA Tissue Perfusion: Goal: Complications of ischemic stroke/TIA will be minimized Outcome: Progressing   Problem: Health Behavior/Discharge Planning: Goal: Ability to manage health-related needs will improve Outcome: Progressing   Problem: Self-Care: Goal: Ability to participate in self-care as condition permits will improve Outcome: Progressing

## 2023-11-18 NOTE — Care Management Important Message (Signed)
Important Message  Patient Details  Name: Philip [name] MRN: [id number] Date of Birth: [birth year]   Important Message Given:  Yes - Medicare IM     Bernadette Hoit 11/18/2023, 9:58 AM

## 2023-11-18 NOTE — Progress Notes (Signed)
Report called to [facility]. Assisted patient with changing closes. EMS notified for pickup and transport to room 502.

## 2023-11-18 NOTE — Discharge Summary (Signed)
Physician Discharge Summary  Philip [name]:[id number] DOB: [birth year] DOA: 11/15/2023  PCP: Earnestine Mealing, MD  Admit date: 11/15/2023 Discharge date: 11/18/2023  Admitted From: home Disposition:  SNF  Recommendations for Outpatient Follow-up:  Follow up with PCP in 1-2 weeks F/u w/ neuro, Dr. Sherryll Burger or Dr. Malvin Johns in 6-8 weeks   Home Health: no  Equipment/Devices:  Discharge Condition: stable  CODE STATUS: full  Diet recommendation: Heart Healthy:  Please CUT All Meats, add Gravies.  NO Salads. NO STRAWS!!  Brief/Interim Summary: HPI was taken from Dr. Huel Cote:  Philip [name] is a 82 y.o. 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   Philip [name] states that for the last 2-3 weeks, he has been experiencing left-sided weakness.  His daughter at bedside states she first noticed it on December 1.  In addition, he was unable to write and seemed unable to understand how to use a pen.  She notes that he has left-handed.  Since symptom onset, weakness has persisted and his facility has noted intermittent episodes of confusion.  Philip [name] denies any slurring of speech, difficulty swallowing, blurry vision, diplopia, chest pain, palpitations, nausea, vomiting, dizziness.  He denies any right-sided weakness as far as he is aware.   ED course: On arrival to the ED, patient was normotensive at 125/74 with heart rate of 72.  He was saturating at 98% on room air.  He was afebrile at 97.9.Initial workup notable for platelets of 482, glucose 107, creatinine 1.30, GFR 54.  MRI of the brain obtained earlier today demonstrating bilateral acute and subacute infarcts.  Due to this, [facility] contacted for admission.    Discharge Diagnoses:  Principal Problem:   Acute CVA (cerebrovascular accident) (HCC) Active Problems:   Essential hypertension   First degree AV  block   Hypercholesterolemia   Chronic kidney disease, stage 3a (HCC)   Thrombocytosis Acute CVA: presenting with 3 weeks of left-sided weakness that is persistent today with subacute right-sided infarcts seen on imaging, however also acute infarcts in the left side.  Likely embolic etiology.  No previous hx of a.fib. Will have zio patch placed prior to d/c. EEG shows no seizures or epileptiform discharges were seen throughout the recording. PT/OT recs SNF.    HTN: continue on lisinopril    First degree AV block: continue on tele. Holding metoprolol    HLD: continue on statin    CKDIIIa: Cr is labile. Avoid nephrotoxic meds     Thrombocytosis: etiology unclear. Labile    Discharge Instructions  Discharge Instructions     Diet - low sodium heart healthy   Complete by: As directed    Please CUT All Meats, add Gravies. NO Salads. NO STRAWS!!   Discharge instructions   Complete by: As directed    F/u w/ PCP in 1-2 weeks. F/u w/ neuro, Dr. Malvin Johns or Dr. Sherryll Burger, in 6-8 weeks. Aspirin 81 mg daily indefinitely & Plavix 75 mg daily for 3 weeks. Repeat MRI brain in ~6-8 weeks to confirm resolution of abnormal enhancement. 30-day event monitor prior to discharge, if this is negative will need loop recorder placement for embolic stroke of undetermined source.   Discharge instructions   Complete by: As directed    F/u w/ PCP in 1-2 weeks. F/u neuro, Dr. Sherryll Burger or Dr. Malvin Johns in 6-8 weeks. Repeat MRI brain in ~ 6-8 weeks to confirm resolution  of abnormal enhancement. 30-day event monitor prior to discharge, if this is negative will need loop recorder placement for embolic stroke of undetermined source   Increase activity slowly   Complete by: As directed    Increase activity slowly   Complete by: As directed       Allergies as of 11/18/2023   No Known Allergies      Medication List     STOP taking these medications    lidocaine 4 % Replaced by: lidocaine 5 %       TAKE these  medications    aspirin EC 81 MG tablet Take 1 tablet (81 mg total) by mouth daily. Swallow whole.   atorvastatin 40 MG tablet Commonly known as: LIPITOR Take 1 tablet (40 mg total) by mouth at bedtime. What changed:  medication strength how much to take when to take this   Centrum Men Tabs Take 1 tablet by mouth daily.   chlorhexidine 0.12 % solution Commonly known as: PERIDEX Use as directed in the mouth or throat at bedtime. 0.5 ounce by mouth at bedtime for mouth care (swish for 30 seconds and spit after evening mouth care. DO NOT RINSE.)   clopidogrel 75 MG tablet Commonly known as: PLAVIX Take 1 tablet (75 mg total) by mouth daily for 21 days.   dextromethorphan-guaiFENesin 30-600 MG 12hr tablet Commonly known as: MUCINEX DM Take 1 tablet by mouth 2 (two) times daily.   fosfomycin 3 g Pack Commonly known as: MONUROL Take 3 g by mouth once.   lidocaine 5 % Commonly known as: LIDODERM Place 1 patch onto the skin daily. Remove & Discard patch within 12 hours or as directed by MD Replaces: lidocaine 4 %   lisinopril 10 MG tablet Commonly known as: ZESTRIL Take 10 mg by mouth daily.   metoprolol succinate 25 MG 24 hr tablet Commonly known as: TOPROL-XL Take 12.5 mg by mouth daily.   nystatin powder Apply to groin/affected areas topically as needed   ondansetron 4 MG tablet Commonly known as: ZOFRAN Take 4 mg by mouth every 8 (eight) hours as needed for nausea or vomiting.   polyethylene glycol 17 g packet Commonly known as: MIRALAX / GLYCOLAX Take 17 g by mouth daily as needed.   Slow-Mag 71.5-119 MG Tbec SR tablet Generic drug: magnesium chloride Take 1 tablet by mouth at bedtime.   Vitamin D (Ergocalciferol) 1.25 MG (50000 UNIT) Caps capsule Commonly known as: DRISDOL Take 50,000 Units by mouth every 7 (seven) days.        Follow-up Information     Lonell Face, MD. Go on 11/17/2023.   Specialty: Neurology Why: Patient is request by office to  call and make follow up appt as new patient  F/u in 6-8 weeks Contact information: [address] [facility] [place] [address] [phone]         Earnestine Mealing, MD. Go on 11/24/2023.   Specialty: Family Medicine Why: @10am  with Dierdre Searles information: [address] [place] [address] [phone]                No Known Allergies  Consultations: Neuro    Procedures/Studies: ECHOCARDIOGRAM COMPLETE Result Date: 11/16/2023    ECHOCARDIOGRAM REPORT   Patient Name:   KONGMENG PULKRABEK Date of Exam: 11/16/2023 Medical Rec #:  [id number]      Height:       68.0 in Accession #:    [id number]     Weight:  179.5 lb Date of Birth:  June 24, 1941      BSA:          1.952 m Patient Age:    82 years       BP:           131/68 mmHg Patient Gender: M              HR:           71 bpm. Exam Location:  [facility] Procedure: 2D Echo, Cardiac Doppler, Color Doppler and Saline Contrast Bubble            Study Indications:     Stroke  History:         Patient has no prior history of Echocardiogram examinations.                  Stroke; Risk Factors:Hypertension. CKD.  Sonographer:     Mikki Harbor Referring Phys:  [id number] Verdene Lennert Diagnosing Phys: Julien Nordmann MD  Sonographer Comments: Image acquisition challenging due to respiratory motion. IMPRESSIONS  1. Left ventricular ejection fraction, by estimation, is 60 to 65%. The left ventricle has normal function. The left ventricle has no regional wall motion abnormalities. There is mild left ventricular hypertrophy. Left ventricular diastolic parameters are consistent with Grade I diastolic dysfunction (impaired relaxation).  2. Right ventricular systolic function is normal. The right ventricular size is normal.  3. The mitral valve is normal in structure. No evidence of mitral valve regurgitation. No evidence of mitral stenosis.  4. The aortic valve is normal in structure. Aortic valve regurgitation is  not visualized. No aortic stenosis is present.  5. The inferior vena cava is normal in size with greater than 50% respiratory variability, suggesting right atrial pressure of 3 mmHg. FINDINGS  Left Ventricle: Left ventricular ejection fraction, by estimation, is 60 to 65%. The left ventricle has normal function. The left ventricle has no regional wall motion abnormalities. The left ventricular internal cavity size was normal in size. There is  mild left ventricular hypertrophy. Left ventricular diastolic parameters are consistent with Grade I diastolic dysfunction (impaired relaxation). Right Ventricle: The right ventricular size is normal. No increase in right ventricular wall thickness. Right ventricular systolic function is normal. Left Atrium: Left atrial size was normal in size. Right Atrium: Right atrial size was normal in size. Pericardium: There is no evidence of pericardial effusion. Mitral Valve: The mitral valve is normal in structure. No evidence of mitral valve regurgitation. No evidence of mitral valve stenosis. MV peak gradient, 5.8 mmHg. The mean mitral valve gradient is 3.0 mmHg. Tricuspid Valve: The tricuspid valve is normal in structure. Tricuspid valve regurgitation is not demonstrated. No evidence of tricuspid stenosis. Aortic Valve: The aortic valve is normal in structure. Aortic valve regurgitation is not visualized. No aortic stenosis is present. Aortic valve mean gradient measures 2.0 mmHg. Aortic valve peak gradient measures 4.5 mmHg. Aortic valve area, by VTI measures 2.62 cm. Pulmonic Valve: The pulmonic valve was normal in structure. Pulmonic valve regurgitation is not visualized. No evidence of pulmonic stenosis. Aorta: The aortic root is normal in size and structure. Venous: The inferior vena cava is normal in size with greater than 50% respiratory variability, suggesting right atrial pressure of 3 mmHg. IAS/Shunts: No atrial level shunt detected by color flow Doppler. Agitated saline  contrast was given intravenously to evaluate for intracardiac shunting.  LEFT VENTRICLE PLAX 2D LVIDd:         3.50 cm   Diastology  LVIDs:         2.50 cm   LV e' lateral:   6.64 cm/s LV PW:         1.40 cm   LV E/e' lateral: 14.7 LV IVS:        1.40 cm LVOT diam:     2.00 cm LV SV:         64 LV SV Index:   33 LVOT Area:     3.14 cm  RIGHT VENTRICLE RV Basal diam:  4.10 cm RV Mid diam:    3.10 cm RV S prime:     9.68 cm/s LEFT ATRIUM             Index        RIGHT ATRIUM           Index LA diam:        3.90 cm 2.00 cm/m   RA Area:     11.70 cm LA Vol (A2C):   67.9 ml 34.79 ml/m  RA Volume:   25.40 ml  13.01 ml/m LA Vol (A4C):   63.5 ml 32.54 ml/m LA Biplane Vol: 67.0 ml 34.33 ml/m  AORTIC VALVE                    PULMONIC VALVE AV Area (Vmax):    2.67 cm     PV Vmax:       0.85 m/s AV Area (Vmean):   2.26 cm     PV Peak grad:  2.9 mmHg AV Area (VTI):     2.62 cm AV Vmax:           106.00 cm/s AV Vmean:          67.000 cm/s AV VTI:            0.243 m AV Peak Grad:      4.5 mmHg AV Mean Grad:      2.0 mmHg LVOT Vmax:         90.20 cm/s LVOT Vmean:        48.200 cm/s LVOT VTI:          0.203 m LVOT/AV VTI ratio: 0.84  AORTA Ao Root diam: 3.60 cm MITRAL VALVE MV Area (PHT): 2.29 cm     SHUNTS MV Area VTI:   1.81 cm     Systemic VTI:  0.20 m MV Peak grad:  5.8 mmHg     Systemic Diam: 2.00 cm MV Mean grad:  3.0 mmHg MV Vmax:       1.20 m/s MV Vmean:      77.4 cm/s MV Decel Time: 331 msec MV E velocity: 97.50 cm/s MV A velocity: 106.00 cm/s MV E/A ratio:  0.92 Julien Nordmann MD Electronically signed by Julien Nordmann MD Signature Date/Time: 11/16/2023/2:38:10 PM    Final    EEG adult Result Date: 11/16/2023 Charlsie Quest, MD     11/16/2023  1:23 PM Patient Name: BRAYLEE MEHROTRA MRN: [id number] Epilepsy Attending: Charlsie Quest Referring Physician/Provider: Gordy Councilman, MD Date: 11/16/2023 Duration: 28.08 mins Patient history: 82 y.o. man presenting with multifocal lesions on MRI brain. EEG to  evaluate for seizure Level of alertness: Awake, asleep AEDs during EEG study: None Technical aspects: This EEG study was done with scalp electrodes positioned according to the 10-20 International system of electrode placement. Electrical activity was reviewed with band pass filter of 1-70Hz , sensitivity of 7 uV/mm, display speed of 2mm/sec with a 60Hz  notched filter  applied as appropriate. EEG data were recorded continuously and digitally stored.  Video monitoring was available and reviewed as appropriate. Description: The posterior dominant rhythm consists of 8 Hz activity of moderate voltage (25-35 uV) seen predominantly in posterior head regions, symmetric and reactive to eye opening and eye closing. Sleep was characterized by vertex waves, sleep spindles (12 to 14 Hz), maximal frontocentral region. EEG showed continuous generalized and lateralized left hemisphere 5 to 6 Hz theta slowing admixed with intermittent 2-3hz  delta slowing. Hyperventilation and photic stimulation were not performed.   ABNORMALITY - Continuous slow, generalized and lateralized left hemisphere IMPRESSION: This study is suggestive of cortical dysfunction arising from left hemisphere likely secondary to underlying structural abnormality. Additionally there is moderate diffuse encephalopathy. No seizures or epileptiform discharges were seen throughout the recording. Charlsie Quest   CT CHEST ABDOMEN PELVIS W CONTRAST Result Date: 11/15/2023 CLINICAL DATA:  Evaluation for occult malignancy. 82 year old male presenting to the emergency department after outpatient MRI showed multiple acute strokes. EXAM: CT CHEST, ABDOMEN, AND PELVIS WITH CONTRAST TECHNIQUE: Multidetector CT imaging of the chest, abdomen and pelvis was performed following the standard protocol during bolus administration of intravenous contrast. RADIATION DOSE REDUCTION: This exam was performed according to the departmental dose-optimization program which includes  automated exposure control, adjustment of the mA and/or kV according to patient size and/or use of iterative reconstruction technique. CONTRAST:  OMNIPAQUE IOHEXOL 350 MG/ML SOLN COMPARISON:  PET/CT 01/02/2019 and CT abdomen and pelvis 11/23/2018 FINDINGS: CT CHEST FINDINGS Cardiovascular: Normal heart size. No pericardial effusion. Coronary artery and aortic atherosclerotic calcification. Mitral annular calcification. Mediastinum/Nodes: Trachea and esophagus are unremarkable. No thoracic adenopathy. Lungs/Pleura: Centrilobular and paraseptal emphysema greatest in the upper lobes. Diffuse bronchial wall thickening greatest in the lower lobes. No focal consolidation, pleural effusion, or pneumothorax. Musculoskeletal: No acute fracture or destructive osseous lesion. CT ABDOMEN PELVIS FINDINGS Hepatobiliary: No focal hepatic lesion. Cholelithiasis without evidence of cholecystitis. No biliary dilation. Pancreas: Fatty atrophy.  No acute abnormality. Spleen: Unremarkable. Adrenals/Urinary Tract: Normal adrenal glands. Bilateral renal atrophy. Decreased size of the cystic lesion in the anterior right kidney. This was previously characterized as a benign cyst. No follow-up recommended. 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 No urinary calculi or hydronephrosis. Stomach/Bowel: Stomach is within normal limits. No bowel obstruction or bowel wall thickening. Colonic diverticulosis without diverticulitis. Normal appendix. Vascular/Lymphatic: Aortic atherosclerosis. No enlarged abdominal or pelvic lymph nodes. Reproductive: Cysto prostatectomy. Other: No free intraperitoneal fluid or air. Musculoskeletal: Compression fracture of L2 and L4 similar to radiographs 04/27/2023. No acute fracture or destructive osseous lesion. IMPRESSION: 1. No evidence of malignancy in the chest, abdomen, or pelvis. 2. Postoperative change of cystoprostatectomy with right lower quadrant ileal  conduit. 3. Cholelithiasis. 4. COPD. Aortic Atherosclerosis (ICD10-I70.0) and Emphysema (ICD10-J43.9). Electronically Signed   By: Minerva Fester M.D.   On: 11/15/2023 18:04   CT ANGIO HEAD NECK W WO CM Result Date: 11/15/2023 CLINICAL DATA:  Outpatient MRI shows multiple acute strokes EXAM: CT ANGIOGRAPHY HEAD AND NECK WITH AND WITHOUT CONTRAST TECHNIQUE: Multidetector CT imaging of the head and neck was performed using the standard protocol during bolus administration of intravenous contrast. Multiplanar CT image reconstructions and MIPs were obtained to evaluate the vascular anatomy. Carotid stenosis measurements (when applicable) are obtained utilizing NASCET criteria, using the distal internal carotid diameter as the denominator. RADIATION DOSE REDUCTION: This exam was performed according to the departmental dose-optimization program which includes automated exposure control, adjustment of the mA  and/or kV according to patient size and/or use of iterative reconstruction technique. CONTRAST:  OMNIPAQUE IOHEXOL 350 MG/ML SOLN COMPARISON:  No prior CTA available, correlation is made with 11/14/2023 CT head and 11/15/2023 MRI head FINDINGS: CT HEAD FINDINGS Brain: Redemonstrated hypodensity in the left occipital lobe, which correlates with 1 of the acute infarcts seen on the same-day MRI. The other acute infarcts do not have definite CT correlates. Remote infarcts in the cerebellum. No evidence of acute hemorrhage, mass, mass effect, or midline shift. No hydrocephalus or extra-axial fluid collection. Vascular: No hyperdense vessel. Skull: Negative for fracture or focal lesion. Sinuses/Orbits: Right frontal mucous retention cyst. No acute finding in the orbits. Other: The mastoid air cells are well aerated. CTA NECK FINDINGS Aortic arch: Standard branching. Imaged portion shows no evidence of aneurysm or dissection. No significant stenosis of the major arch vessel origins. Aortic atherosclerosis. Right  carotid system: No evidence of dissection, occlusion, or hemodynamically significant stenosis (greater than 50%). Atherosclerotic disease at the bifurcation and in the proximal ICA is not hemodynamically significant. Left carotid system: No evidence of dissection, occlusion, or hemodynamically significant stenosis (greater than 50%). Atherosclerotic disease at the bifurcation and in the proximal ICA is not hemodynamically significant. Vertebral arteries: The left vertebral artery is not opacified at its origin or in the left V1, V2, or V3 segment. The right vertebral artery is patent from its origin to the skull base. No evidence of dissection. Skeleton: No acute osseous abnormality. Degenerative changes in the cervical spine. ACDF C4-C7. Solid arthrodesis. Other neck: No acute finding. Upper chest: No focal pulmonary opacity or pleural effusion. Emphysema. Review of the MIP images confirms the above findings CTA HEAD FINDINGS Anterior circulation: Both internal carotid arteries are patent to the termini, with moderate stenosis in the proximal left cavernous ICA (series 8, image 125) and mild stenosis in the distal left supraclinoid ICA (series 8, image 109). A1 segments patent, hypoplastic on the right. Normal anterior communicating artery. Anterior cerebral arteries are patent to their distal aspects without significant stenosis. No M1 stenosis or occlusion. MCA branches perfused to their distal aspects without significant stenosis. Posterior circulation: The right vertebral artery is patent to the vertebrobasilar junction. The left V4 segment is not opacified proximally, with distal filling likely retrograde; this is favored to be chronic given the appearance on the 11/24/2022 MRI head. Bilateral posteroinferior cerebellar arteries are patent proximally. Basilar is diminutive but patent to its distal aspect without significant stenosis. Superior cerebellar arteries patent proximally. Patent left P1. Aplastic  right P1. Fetal origin of the right PCA from the right posterior communicating artery. Mild stenosis in the proximal right P2 (series 8, image 102). PCAs otherwise perfused to their distal aspects without significant stenosis. Venous sinuses: Not well opacified due to phase of timing. Anatomic variants: Fetal origin of the right PCA. No evidence of aneurysm or vascular malformation. Review of the MIP images confirms the above findings IMPRESSION: 1. 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 This is favored to be chronic given the loss of the proximal flow void on the 11/24/2022 MRI head. 2. No other intracranial large vessel occlusion. Moderate stenosis in the proximal left cavernous ICA and mild stenosis in the left supraclinoid ICA and proximal right P2. 3. No other hemodynamically significant stenosis in the neck. 4. Redemonstrated hypodensity in the left occipital lobe, which correlates with 1 of the acute infarcts seen on the same-day MRI.  The other acute infarcts do not have definite CT correlates. 5. Aortic atherosclerosis (ICD10-I70.0) and Emphysema (ICD10-J43.9). Electronically Signed   By: Wiliam Ke M.D.   On: 11/15/2023 17:16   MR Brain W Wo Contrast Result Date: 11/15/2023 CLINICAL DATA:  Left-sided weakness EXAM: MRI HEAD WITHOUT AND WITH CONTRAST TECHNIQUE: Multiplanar, multiecho pulse sequences of the brain and surrounding structures were obtained without and with intravenous contrast. CONTRAST:  7.43mL GADAVIST GADOBUTROL 1 MMOL/ML IV SOLN COMPARISON:  Brain MR 11/24/22, CT head 11/14/23 FINDINGS: Brain: Acute infarcts in the posterior frontal lobe on the left (series 9, image 43) and left occipital lobe (series 9, image 23). There is mild diffusion restriction in the right posterior temporal lobe with associated contrast enhancement in the occipital temporal region and in the right parietal lobe, favored to  represent enhancement related to subacute infarct. No hemorrhage. No mass effect. No mass lesion. Chronic infarcts in the bilateral cerebellum. There is an additional nonspecific contrast-enhancing lesion in the right frontal lobe (series 20, image 101), which is nonspecific and may represent an additional site of a subacute infarct, but this site is without an associated correlate on T2/FLAIR weighted imaging. Vascular: Normal flow voids. Skull and upper cervical spine: Normal marrow signal. Sinuses/Orbits: Small left mastoid effusion. No middle ear effusion. Paranasal sinuses are notable for polypoid mucosal thickening in the right maxillary and right frontal sinus. Bilateral lens replacement. Orbits are otherwise unremarkable. Other: None. IMPRESSION: 1. Acute infarcts in the posterior frontal lobe on the left and left occipital lobe. No hemorrhage or mass effect. 2. Subacute infarcts in the right posterior temporal lobe and right parietal lobe. 3. Additional contrast-enhancing lesion in the anterior right frontal lobe may represent an additional site of a subacute infarct, but this site is without an associated correlate on T2/FLAIR weighted imaging. Recommend follow-up brain MRI in 6 weeks to ensure resolution. Findings were discussed with Dr. [name] on 11/15/23 at 9:30 AM. Electronically Signed   By: Lorenza Cambridge M.D.   On: 11/15/2023 09:42   CT HEAD WO CONTRAST ( ) Result Date: 11/14/2023 CLINICAL DATA:  Transient ischemic attack (TIA) EXAM: CT HEAD WITHOUT CONTRAST TECHNIQUE: Contiguous axial images were obtained from the base of the skull through the vertex without intravenous contrast. RADIATION DOSE REDUCTION: This exam was performed according to the departmental dose-optimization program which includes automated exposure control, adjustment of the mA and/or kV according to patient size and/or use of iterative reconstruction technique. COMPARISON:  Head CT 11/23/22, Brain MR 11/24/22 FINDINGS:  Brain: Chronic infarcts in the left cerebellum. There is an age indeterminate infarct in the left occipital lobe (series 2, image 22). No hemorrhage. No hydrocephalus. No extra-axial fluid collection. No mass effect. No mass lesion. Vascular: No hyperdense vessel or unexpected calcification. Skull: Normal. Negative for fracture or focal lesion. Sinuses/Orbits: No middle ear or mastoid effusion. Degenerative changes of the bilateral TMJs. Paranasal sinuses are notable for mucosal thickening in the right maxillary sinus. Bilateral lens replacement. Orbits are otherwise unremarkable. Other: None. IMPRESSION: Age indeterminate infarct in the left occipital lobe. Recommend brain MRI for further evaluation. These results will be called to the ordering clinician or representative by the Radiologist Assistant, and communication documented in the PACS or [employer]. Electronically Signed   By: Lorenza Cambridge M.D.   On: 11/14/2023 14:45   (Echo, Carotid, EGD, Colonoscopy, ERCP)    Subjective: Pt c/o fatigue    Discharge Exam: Vitals:   11/18/23 0453 11/18/23 0830  BP: (!) 143/79 Marland Kitchen)  157/86  Pulse: 76 88  Resp: 18 19  Temp: 98.2 F (36.8 C) 97.8 F (36.6 C)  SpO2: 95% 97%   Vitals:   11/17/23 2023 11/18/23 0016 11/18/23 0453 11/18/23 0830  BP: (!) 112/59 (!) 141/82 (!) 143/79 (!) 157/86  Pulse: 96 88 76 88  Resp: 17 17 18 19   Temp: 98.2 F (36.8 C) 98.2 F (36.8 C) 98.2 F (36.8 C) 97.8 F (36.6 C)  TempSrc:    Oral  SpO2: 97% 95% 95% 97%  Weight:      Height:        General: Pt is alert, awake, not in acute distress Cardiovascular: S1/S2 +, no rubs, no gallops Respiratory: CTA bilaterally, no wheezing, no rhonchi Abdominal: Soft, NT, ND, bowel sounds + Extremities:no cyanosis    The results of significant diagnostics from this hospitalization (including imaging, microbiology, ancillary and laboratory) are listed below for reference.     Microbiology: Recent Results (from the  past 240 hours)  MRSA Next Gen by PCR, Nasal     Status: None   Collection Time: 11/16/23  4:20 AM   Specimen: Nasal Mucosa; Nasal Swab  Result Value Ref Range Status   MRSA by PCR Next Gen NOT DETECTED NOT DETECTED Final    Comment: (NOTE) The GeneXpert MRSA Assay (FDA approved for NASAL specimens only), is one component of a comprehensive MRSA colonization surveillance program. It is not intended to diagnose MRSA infection nor to guide or monitor treatment for MRSA infections. Test performance is not FDA approved in patients less than 65 years old. Performed at [facility], [address] [place], [place] [address]      Labs: BNP (last 3 results) No results for input(s): "BNP" in the last 8760 hours. Basic Metabolic Panel: Recent Labs  Lab 11/15/23 0928 11/16/23 0552 11/17/23 0603 11/18/23 0559  NA 136 133* 133* 136  K 4.8 4.0 3.9 4.2  CL 101 102 100 101  CO2 25 24 24 26   GLUCOSE 107* 88 84 93  BUN 22 22 25* 28*  CREATININE 1.30* 1.29* 1.53* 1.45*  CALCIUM 9.6 9.1 9.1 9.1   Liver Function Tests: Recent Labs  Lab 11/15/23 0928  AST 24  ALT 25  ALKPHOS 112  BILITOT 0.8  PROT 7.5  ALBUMIN 3.5   No results for input(s): "LIPASE", "AMYLASE" in the last 168 hours. No results for input(s): "AMMONIA" in the last 168 hours. CBC: Recent Labs  Lab 11/15/23 0928 11/16/23 0552 11/17/23 0603 11/18/23 0559  WBC 9.6 10.3 10.4 10.5  NEUTROABS 6.8 6.9  --   --   HGB 14.9 13.3 13.5 14.3  HCT 46.2 40.7 40.4 42.7  MCV 93.0 90.8 89.2 89.9  PLT 482* 488* 489* 520*   Cardiac Enzymes: No results for input(s): "CKTOTAL", "CKMB", "CKMBINDEX", "TROPONINI" in the last 168 hours. BNP: Invalid input(s): "POCBNP" CBG: No results for input(s): "GLUCAP" in the last 168 hours. D-Dimer No results for input(s): "DDIMER" in the last 72 hours. Hgb A1c No results for input(s): "HGBA1C" in the last 72 hours.  Lipid Profile No results for input(s): "CHOL", "HDL",  "LDLCALC", "TRIG", "CHOLHDL", "LDLDIRECT" in the last 72 hours.  Thyroid function studies No results for input(s): "TSH", "T4TOTAL", "T3FREE", "THYROIDAB" in the last 72 hours.  Invalid input(s): "FREET3" Anemia work up No results for input(s): "VITAMINB12", "FOLATE", "FERRITIN", "TIBC", "IRON", "RETICCTPCT" in the last 72 hours. Urinalysis    Component Value Date/Time   COLORURINE AMBER (A) 11/23/2022 2249  APPEARANCEUR CLOUDY (A) 11/23/2022 2249   APPEARANCEUR Cloudy (A) 12/04/2018 1134   LABSPEC 1.017 11/23/2022 2249   PHURINE 8.0 11/23/2022 2249   GLUCOSEU NEGATIVE 11/23/2022 2249   HGBUR NEGATIVE 11/23/2022 2249   BILIRUBINUR NEGATIVE 11/23/2022 2249   BILIRUBINUR Negative 12/04/2018 1134   KETONESUR NEGATIVE 11/23/2022 2249   PROTEINUR >=300 (A) 11/23/2022 2249   NITRITE NEGATIVE 11/23/2022 2249   LEUKOCYTESUR MODERATE (A) 11/23/2022 2249   Sepsis Labs Recent Labs  Lab 11/15/23 0928 11/16/23 0552 11/17/23 0603 11/18/23 0559  WBC 9.6 10.3 10.4 10.5   Microbiology Recent Results (from the past 240 hours)  MRSA Next Gen by PCR, Nasal     Status: None   Collection Time: 11/16/23  4:20 AM   Specimen: Nasal Mucosa; Nasal Swab  Result Value Ref Range Status   MRSA by PCR Next Gen NOT DETECTED NOT DETECTED Final    Comment: (NOTE) The GeneXpert MRSA Assay (FDA approved for NASAL specimens only), is one component of a comprehensive MRSA colonization surveillance program. It is not intended to diagnose MRSA infection nor to guide or monitor treatment for MRSA infections. Test performance is not FDA approved in patients less than 93 years old. Performed at [facility], [address], [place], [place] [address]      Time coordinating discharge: Over 30 minutes  SIGNED:   Charise Killian, MD  [facility] 11/18/2023, 9:50 AM Pager   If 7PM-7AM, please contact night-coverage [email or web address]

## 2023-11-18 NOTE — TOC Transition Note (Signed)
Transition of Care [facility]) - Discharge Note   Patient Details  Name: Philip [name] MRN: [id number] Date of Birth: July 21, 1941  Transition of [facility]) CM/SW Contact:  Garret Reddish, RN Phone Number: 11/18/2023, 11:08 AM   Clinical Narrative:    Chart reviewed.  Noted that patient has orders for discharge today.  Patient will be discharging to [facility].  I have spoken with Sue Lush at [facility].  She informs me that patient will be going to room 502 and the number to call report is [phone].  I have sent [facility] patient's Discharge Summary, SNF transfer Report and Disorders via Epic hub.    I have informed patient and his daughter Wynona Canes that patient will be discharging to [facility] today and [facility] will transport patient to the facility today.   I have arranged [facility] to transport patient to [facility] today.    Daughter informs me that [facility] wheelchair is in patient's room. I have informed Sue Lush with [facility] and she reports that her transport team will pick up when they are at the hospital to pick to transport a resident.  I have made staff nurse aware to please label the wheelchair and [facility] will arrange for the wheelchair to be picked up.  I have informed staff nurse of the above information.     Final next level of care: Skilled Nursing Facility Barriers to Discharge: No Barriers Identified   Patient Goals and CMS Choice   CMS Medicare.gov Compare Post Acute Care list provided to:: Patient Choice offered to / list presented to : Patient      Discharge Placement              Patient chooses bed at: [facility] Patient to be transferred to facility by: [facility] Name of family member notified: Lamont Dowdy ( Patient's daughter) Patient and family notified of of transfer: 11/18/23  Discharge Plan and Services Additional resources added to the After Visit Summary for       Post Acute Care Choice:  Skilled Nursing Facility                               Social Drivers of Health (SDOH) Interventions SDOH Screenings   Food Insecurity: No Food Insecurity (11/15/2023)  Housing: Low Risk  (11/15/2023)  Transportation Needs: No Transportation Needs (11/15/2023)  Utilities: Not At Risk (11/15/2023)  Tobacco Use: Medium Risk (11/15/2023)     Readmission Risk Interventions     No data to display

## 2023-11-18 NOTE — Telephone Encounter (Signed)
Review of admission orders for patient. No longer needs fosfomycin as it is a one time dose. Agree with meds.

## 2023-11-18 NOTE — Progress Notes (Signed)
I notified daughter, Lonia Skinner of discharge back to [facility].

## 2023-11-21 ENCOUNTER — Non-Acute Institutional Stay (SKILLED_NURSING_FACILITY): Payer: Self-pay | Admitting: Adult Health

## 2023-11-21 DIAGNOSIS — I10 Essential (primary) hypertension: Secondary | ICD-10-CM

## 2023-11-21 DIAGNOSIS — E782 Mixed hyperlipidemia: Secondary | ICD-10-CM | POA: Diagnosis not present

## 2023-11-21 DIAGNOSIS — I69359 Hemiplegia and hemiparesis following cerebral infarction affecting unspecified side: Secondary | ICD-10-CM

## 2023-11-21 DIAGNOSIS — D75839 Thrombocytosis, unspecified: Secondary | ICD-10-CM

## 2023-11-21 MED ORDER — LISINOPRIL 5 MG PO TABS: 5.0000 mg | ORAL_TABLET | Freq: Every day | ORAL | Status: DC

## 2023-11-21 NOTE — Progress Notes (Signed)
Location:  Other ([facility] Room Number: 502 A Place of Service:  SNF ([phone]) Provider:  Kenard Gower, DNP, FNP-BC  Patient Care Team: Earnestine Mealing, MD as PCP - General (Family Medicine) Sondra Come, MD as Consulting Physician (Urology) Jeralyn Ruths, MD as Consulting Physician (Oncology) Wyn Quaker Marlow Baars, MD as Referring Physician (Vascular Surgery)  Extended Emergency Contact Information Primary Emergency Contact: warner,[name] Mobile Phone: [phone] Relation: Daughter Interpreter needed? No Secondary Emergency Contact: [name],[name] Home Phone: [phone] Mobile Phone: [phone] Relation: Daughter  Code Status:   Limited DNR  Goals of care: Advanced Directive information    11/15/2023    9:47 PM  Advanced Directives  Would patient like information on creating a medical advance directive? No - Patient declined     Chief Complaint  Patient presents with   Acute Visit    Follow up hospitalization    HPI:  Pt is a 82 y.o. male seen today for hospitalization follow up. 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  He has been having left-sided weakness for 2-3 weeks and intermittent confusions and was sent to the hospital. MRI of the brain showed bilateral acute and subacute infarcts. It was thought to be likely embolic. Zio patch was ordered. EEG done showed no seizures.  BP 98/62, 109/51, 91/59 He is currently taking Metoprolol succinate 12.5 mg daily and Lisinopril 10 mg daily for hypertension.  Zio patch will be started today, 11/21/23.    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 chin. Nodular and infiltrative patterns.EDC.   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 Nodular. EDC   Basal cell carcinoma  08/26/2020   Right mid to inf. ear helix. Nodular, ulcerated. 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 Ulcerated.   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   L upper eyelid, exc and ED, pt had BCC of L upper eyelid and excision also included this SCC   Past Surgical History:  Procedure Laterality Date   BACK SURGERY     CATARACT EXTRACTION     HERNIA REPAIR     PORTA CATH INSERTION N/A 01/01/2019   Procedure: PORTA CATH INSERTION;  Surgeon: Annice Needy, MD;  Location: [facility] INVASIVE CV LAB;  Service: Cardiovascular;  Laterality: N/A;   TONSILLECTOMY     TRANSURETHRAL RESECTION OF BLADDER TUMOR N/A 12/08/2018   Procedure: TRANSURETHRAL RESECTION OF BLADDER TUMOR (TURBT);  Surgeon: Sondra Come, MD;  Location: [facility] ORS;  Service: Urology;  Laterality: N/A;    No Known Allergies  Outpatient Encounter Medications as of 11/21/2023  Medication Sig   aspirin EC 81 MG tablet Take 1 tablet (81 mg total) by mouth daily. Swallow whole.   atorvastatin (LIPITOR) 40 MG tablet Take 1 tablet (40 mg  total) by mouth at bedtime.   chlorhexidine (PERIDEX) 0.12 % solution Use as directed in the mouth or throat at bedtime. 0.5 ounce by mouth at bedtime for mouth care (swish for 30 seconds and spit after evening mouth care. DO NOT RINSE.)   clopidogrel (PLAVIX) 75 MG tablet Take 1 tablet (75 mg total) by mouth daily for 21 days.   dextromethorphan-guaiFENesin (MUCINEX DM) 30-600 MG 12hr tablet Take 1 tablet by mouth 2 (two) times daily.    lidocaine (LIDODERM) 5 % Place 1 patch onto the skin daily. Remove & Discard patch within 12 hours or as directed by MD   lisinopril (ZESTRIL) 10 MG tablet Take 10 mg by mouth daily.   magnesium chloride (SLOW-MAG) 64 MG TBEC SR tablet Take 1 tablet by mouth at bedtime.   metoprolol succinate (TOPROL-XL) 25 MG 24 hr tablet Take 12.5 mg by mouth daily.   Multiple Vitamins-Minerals (CENTRUM MEN) TABS Take 1 tablet by mouth daily.   nystatin powder Apply to groin/affected areas topically as needed   ondansetron (ZOFRAN) 4 MG tablet Take 4 mg by mouth every 8 (eight) hours as needed for nausea or vomiting.   polyethylene glycol (MIRALAX / GLYCOLAX) 17 g packet Take 17 g by mouth daily as needed.   Vitamin D, Ergocalciferol, (DRISDOL) 1.25 MG (50000 UT) CAPS capsule Take 50,000 Units by mouth every 7 (seven) days.   No facility-administered encounter medications on file as of 11/21/2023.    Review of Systems  Constitutional:  Negative for activity change, appetite change and fever.  HENT:  Negative for sore throat.   Eyes: Negative.   Cardiovascular:  Negative for chest pain and leg swelling.  Gastrointestinal:  Negative for abdominal distention, diarrhea and vomiting.  Genitourinary:  Negative for dysuria, frequency and urgency.  Skin:  Negative for color change.  Neurological:  Negative for dizziness and headaches.  Psychiatric/Behavioral:  Negative for behavioral problems and sleep disturbance. The patient is not nervous/anxious.        Immunization History  Administered Date(s) Administered   Influenza Inj Mdck Quad Pf 08/25/2021, 08/31/2022   Influenza-Unspecified 08/27/2014, 09/03/2015, 08/31/2016, 08/29/2017, 09/21/2023   Moderna Covid-19 Fall Seasonal Vaccine 16yrs & older 03/08/2023   Moderna Sars-Covid-2 Vaccination 12/11/2019, 01/08/2020, 10/08/2020, 08/20/2021   Pneumococcal Conjugate-13 03/21/2014, 06/02/2017   Pneumococcal Polysaccharide-23 04/08/2016   Tdap 12/05/2017    Unspecified SARS-COV-2 Vaccination 08/26/2023   Pertinent  Health Maintenance Due  Topic Date Due   INFLUENZA VACCINE  Completed      11/23/2022   10:33 PM 11/24/2022   10:00 PM 11/25/2022    8:30 PM 11/26/2022    9:00 AM 04/24/2023    9:06 PM  Fall Risk  Falls in the past year?     1  Was there an injury with Fall?     1  Fall Risk Category Calculator     3  (RETIRED) Patient Fall Risk Level High fall risk High fall risk High fall risk High fall risk   Patient at Risk for Falls Due to     History of fall(s);Impaired balance/gait;Impaired mobility  Fall risk Follow up     Falls prevention discussed     Vitals:   11/21/23 1707  BP: 98/62  Pulse: 65  Temp: 97.6 F (36.4 C)  SpO2: 96%  Weight: 178 lb (80.7 kg)  Height: 5\' 8"  (1.727 m)   Body mass index is 27.06 kg/m.  Physical Exam Constitutional:      Appearance: Normal appearance.  HENT:     Head: Normocephalic and atraumatic.     Mouth/Throat:     Mouth: Mucous membranes are moist.  Eyes:     Conjunctiva/sclera: Conjunctivae normal.  Cardiovascular:     Rate and Rhythm: Normal rate and regular rhythm.     Pulses: Normal pulses.     Heart sounds: Normal heart sounds.  Pulmonary:     Effort: Pulmonary effort is normal.     Breath sounds: Normal breath sounds.  Abdominal:     General: Bowel sounds are normal.     Palpations: Abdomen is soft.  Musculoskeletal:        General: No swelling.     Cervical back: Normal range of motion.  Skin:    General: Skin is warm and dry.  Neurological:     Mental Status: He is alert.  Psychiatric:        Mood and Affect: Mood normal.        Behavior: Behavior normal.       Labs reviewed: Recent Labs    11/16/23 0552 11/17/23 0603 11/18/23 0559  NA 133* 133* 136  K 4.0 3.9 4.2  CL 102 100 101  CO2 24 24 26   GLUCOSE 88 84 93  BUN 22 25* 28*  CREATININE 1.29* 1.53* 1.45*  CALCIUM 9.1 9.1 9.1   Recent Labs    01/27/23 0000 09/29/23 0000 11/15/23 0928   AST 12* 17 24  ALT 11 19 25   ALKPHOS 91 95 112  BILITOT  --   --  0.8  PROT  --   --  7.5  ALBUMIN 3.3* 3.6 3.5   Recent Labs    11/15/23 0928 11/16/23 0552 11/17/23 0603 11/18/23 0559  WBC 9.6 10.3 10.4 10.5  NEUTROABS 6.8 6.9  --   --   HGB 14.9 13.3 13.5 14.3  HCT 46.2 40.7 40.4 42.7  MCV 93.0 90.8 89.2 89.9  PLT 482* 488* 489* 520*   No results found for: "TSH" Lab Results  Component Value Date   HGBA1C 5.6 11/15/2023   Lab Results  Component Value Date   CHOL 148 11/15/2023   HDL 42 11/15/2023   LDLCALC 90 11/15/2023   TRIG 79 11/15/2023   CHOLHDL 3.5 11/15/2023    Significant Diagnostic Results in last 30 days:  ECHOCARDIOGRAM COMPLETE Result Date: 11/16/2023    ECHOCARDIOGRAM REPORT   Patient Name:   Philip [name] Date of Exam: 11/16/2023 Medical Rec #:  [id number]      Height:       68.0 in Accession #:    [id number]     Weight:       179.5 lb Date of Birth:  1941/01/26      BSA:          1.952 m Patient Age:    82 years       BP:           131/68 mmHg Patient Gender: M              HR:           71 bpm. Exam Location:  [facility] Procedure: 2D Echo, Cardiac Doppler, Color Doppler and Saline Contrast Bubble            Study Indications:     Stroke  History:         Patient has no prior history of Echocardiogram examinations.  Stroke; Risk Factors:Hypertension. CKD.  Sonographer:     Mikki Harbor Referring Phys:  [id number] Verdene Lennert Diagnosing Phys: Julien Nordmann MD  Sonographer Comments: Image acquisition challenging due to respiratory motion. IMPRESSIONS  1. Left ventricular ejection fraction, by estimation, is 60 to 65%. The left ventricle has normal function. The left ventricle has no regional wall motion abnormalities. There is mild left ventricular hypertrophy. Left ventricular diastolic parameters are consistent with Grade I diastolic dysfunction (impaired relaxation).  2. Right ventricular systolic function is normal. The right ventricular size  is normal.  3. The mitral valve is normal in structure. No evidence of mitral valve regurgitation. No evidence of mitral stenosis.  4. The aortic valve is normal in structure. Aortic valve regurgitation is not visualized. No aortic stenosis is present.  5. The inferior vena cava is normal in size with greater than 50% respiratory variability, suggesting right atrial pressure of 3 mmHg. FINDINGS  Left Ventricle: Left ventricular ejection fraction, by estimation, is 60 to 65%. The left ventricle has normal function. The left ventricle has no regional wall motion abnormalities. The left ventricular internal cavity size was normal in size. There is  mild left ventricular hypertrophy. Left ventricular diastolic parameters are consistent with Grade I diastolic dysfunction (impaired relaxation). Right Ventricle: The right ventricular size is normal. No increase in right ventricular wall thickness. Right ventricular systolic function is normal. Left Atrium: Left atrial size was normal in size. Right Atrium: Right atrial size was normal in size. Pericardium: There is no evidence of pericardial effusion. Mitral Valve: The mitral valve is normal in structure. No evidence of mitral valve regurgitation. No evidence of mitral valve stenosis. MV peak gradient, 5.8 mmHg. The mean mitral valve gradient is 3.0 mmHg. Tricuspid Valve: The tricuspid valve is normal in structure. Tricuspid valve regurgitation is not demonstrated. No evidence of tricuspid stenosis. Aortic Valve: The aortic valve is normal in structure. Aortic valve regurgitation is not visualized. No aortic stenosis is present. Aortic valve mean gradient measures 2.0 mmHg. Aortic valve peak gradient measures 4.5 mmHg. Aortic valve area, by VTI measures 2.62 cm. Pulmonic Valve: The pulmonic valve was normal in structure. Pulmonic valve regurgitation is not visualized. No evidence of pulmonic stenosis. Aorta: The aortic root is normal in size and structure. Venous: The  inferior vena cava is normal in size with greater than 50% respiratory variability, suggesting right atrial pressure of 3 mmHg. IAS/Shunts: No atrial level shunt detected by color flow Doppler. Agitated saline contrast was given intravenously to evaluate for intracardiac shunting.  LEFT VENTRICLE PLAX 2D LVIDd:         3.50 cm   Diastology LVIDs:         2.50 cm   LV e' lateral:   6.64 cm/s LV PW:         1.40 cm   LV E/e' lateral: 14.7 LV IVS:        1.40 cm LVOT diam:     2.00 cm LV SV:         64 LV SV Index:   33 LVOT Area:     3.14 cm  RIGHT VENTRICLE RV Basal diam:  4.10 cm RV Mid diam:    3.10 cm RV S prime:     9.68 cm/s LEFT ATRIUM             Index        RIGHT ATRIUM           Index LA diam:  3.90 cm 2.00 cm/m   RA Area:     11.70 cm LA Vol (A2C):   67.9 ml 34.79 ml/m  RA Volume:   25.40 ml  13.01 ml/m LA Vol (A4C):   63.5 ml 32.54 ml/m LA Biplane Vol: 67.0 ml 34.33 ml/m  AORTIC VALVE                    PULMONIC VALVE AV Area (Vmax):    2.67 cm     PV Vmax:       0.85 m/s AV Area (Vmean):   2.26 cm     PV Peak grad:  2.9 mmHg AV Area (VTI):     2.62 cm AV Vmax:           106.00 cm/s AV Vmean:          67.000 cm/s AV VTI:            0.243 m AV Peak Grad:      4.5 mmHg AV Mean Grad:      2.0 mmHg LVOT Vmax:         90.20 cm/s LVOT Vmean:        48.200 cm/s LVOT VTI:          0.203 m LVOT/AV VTI ratio: 0.84  AORTA Ao Root diam: 3.60 cm MITRAL VALVE MV Area (PHT): 2.29 cm     SHUNTS MV Area VTI:   1.81 cm     Systemic VTI:  0.20 m MV Peak grad:  5.8 mmHg     Systemic Diam: 2.00 cm MV Mean grad:  3.0 mmHg MV Vmax:       1.20 m/s MV Vmean:      77.4 cm/s MV Decel Time: 331 msec MV E velocity: 97.50 cm/s MV A velocity: 106.00 cm/s MV E/A ratio:  0.92 Julien Nordmann MD Electronically signed by Julien Nordmann MD Signature Date/Time: 11/16/2023/2:38:10 PM    Final    EEG adult Result Date: 11/16/2023 Charlsie Quest, MD     11/16/2023  1:23 PM Patient Name: Philip [name] MRN: [id number]  Epilepsy Attending: Charlsie Quest Referring Physician/Provider: Gordy Councilman, MD Date: 11/16/2023 Duration: 28.08 mins Patient history: 82 y.o. man presenting with multifocal lesions on MRI brain. EEG to evaluate for seizure Level of alertness: Awake, asleep AEDs during EEG study: None Technical aspects: This EEG study was done with scalp electrodes positioned according to the 10-20 International system of electrode placement. Electrical activity was reviewed with band pass filter of 1-70Hz , sensitivity of 7 uV/mm, display speed of 75mm/sec with a 60Hz  notched filter applied as appropriate. EEG data were recorded continuously and digitally stored.  Video monitoring was available and reviewed as appropriate. Description: The posterior dominant rhythm consists of 8 Hz activity of moderate voltage (25-35 uV) seen predominantly in posterior head regions, symmetric and reactive to eye opening and eye closing. Sleep was characterized by vertex waves, sleep spindles (12 to 14 Hz), maximal frontocentral region. EEG showed continuous generalized and lateralized left hemisphere 5 to 6 Hz theta slowing admixed with intermittent 2-3hz  delta slowing. Hyperventilation and photic stimulation were not performed.   ABNORMALITY - Continuous slow, generalized and lateralized left hemisphere IMPRESSION: This study is suggestive of cortical dysfunction arising from left hemisphere likely secondary to underlying structural abnormality. Additionally there is moderate diffuse encephalopathy. No seizures or epileptiform discharges were seen throughout the recording. Charlsie Quest   CT CHEST ABDOMEN PELVIS W CONTRAST Result Date: 11/15/2023 CLINICAL  DATA:  Evaluation for occult malignancy. 82 year old male presenting to the emergency department after outpatient MRI showed multiple acute strokes. EXAM: CT CHEST, ABDOMEN, AND PELVIS WITH CONTRAST TECHNIQUE: Multidetector CT imaging of the chest, abdomen and pelvis was performed  following the standard protocol during bolus administration of intravenous contrast. RADIATION DOSE REDUCTION: This exam was performed according to the departmental dose-optimization program which includes automated exposure control, adjustment of the mA and/or kV according to patient size and/or use of iterative reconstruction technique. CONTRAST:  OMNIPAQUE IOHEXOL 350 MG/ML SOLN COMPARISON:  PET/CT 01/02/2019 and CT abdomen and pelvis 11/23/2018 FINDINGS: CT CHEST FINDINGS Cardiovascular: Normal heart size. No pericardial effusion. Coronary artery and aortic atherosclerotic calcification. Mitral annular calcification. Mediastinum/Nodes: Trachea and esophagus are unremarkable. No thoracic adenopathy. Lungs/Pleura: Centrilobular and paraseptal emphysema greatest in the upper lobes. Diffuse bronchial wall thickening greatest in the lower lobes. No focal consolidation, pleural effusion, or pneumothorax. Musculoskeletal: No acute fracture or destructive osseous lesion. CT ABDOMEN PELVIS FINDINGS Hepatobiliary: No focal hepatic lesion. Cholelithiasis without evidence of cholecystitis. No biliary dilation. Pancreas: Fatty atrophy.  No acute abnormality. Spleen: Unremarkable. Adrenals/Urinary Tract: Normal adrenal glands. Bilateral renal atrophy. Decreased size of the cystic lesion in the anterior right kidney. This was previously characterized as a benign cyst. No follow-up recommended. 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 No urinary calculi or hydronephrosis. Stomach/Bowel: Stomach is within normal limits. No bowel obstruction or bowel wall thickening. Colonic diverticulosis without diverticulitis. Normal appendix. Vascular/Lymphatic: Aortic atherosclerosis. No enlarged abdominal or pelvic lymph nodes. Reproductive: Cysto prostatectomy. Other: No free intraperitoneal fluid or air. Musculoskeletal: Compression fracture of L2 and L4 similar to radiographs 04/27/2023.  No acute fracture or destructive osseous lesion. IMPRESSION: 1. No evidence of malignancy in the chest, abdomen, or pelvis. 2. 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 3. Cholelithiasis. 4. COPD. Aortic Atherosclerosis (ICD10-I70.0) and Emphysema (ICD10-J43.9). Electronically Signed   By: Minerva Fester M.D.   On: 11/15/2023 18:04   CT ANGIO HEAD NECK W WO CM Result Date: 11/15/2023 CLINICAL DATA:  Outpatient MRI shows multiple acute strokes EXAM: CT ANGIOGRAPHY HEAD AND NECK WITH AND WITHOUT CONTRAST TECHNIQUE: Multidetector CT imaging of the head and neck was performed using the standard protocol during bolus administration of intravenous contrast. Multiplanar CT image reconstructions and MIPs were obtained to evaluate the vascular anatomy. Carotid stenosis measurements (when applicable) are obtained utilizing NASCET criteria, using the distal internal carotid diameter as the denominator. RADIATION DOSE REDUCTION: This exam was performed according to the departmental dose-optimization program which includes automated exposure control, adjustment of the mA and/or kV according to patient size and/or use of iterative reconstruction technique. CONTRAST:  OMNIPAQUE IOHEXOL 350 MG/ML SOLN COMPARISON:  No prior CTA available, correlation is made with 11/14/2023 CT head and 11/15/2023 MRI head FINDINGS: CT HEAD FINDINGS Brain: Redemonstrated hypodensity in the left occipital lobe, which correlates with 1 of the acute infarcts seen on the same-day MRI. The other acute infarcts do not have definite CT correlates. Remote infarcts in the cerebellum. No evidence of acute hemorrhage, mass, mass effect, or midline shift. No hydrocephalus or extra-axial fluid collection. Vascular: No hyperdense vessel. Skull: Negative for fracture or focal lesion. Sinuses/Orbits: Right frontal mucous retention cyst. No acute finding in the orbits. Other: The mastoid air cells are well aerated.  CTA NECK FINDINGS Aortic arch: Standard branching. Imaged portion shows no evidence of aneurysm or dissection. No significant stenosis of the major arch vessel origins. Aortic  atherosclerosis. Right carotid system: No evidence of dissection, occlusion, or hemodynamically significant stenosis (greater than 50%). Atherosclerotic disease at the bifurcation and in the proximal ICA is not hemodynamically significant. Left carotid system: No evidence of dissection, occlusion, or hemodynamically significant stenosis (greater than 50%). Atherosclerotic disease at the bifurcation and in the proximal ICA is not hemodynamically significant. Vertebral arteries: The left vertebral artery is not opacified at its origin or in the left V1, V2, or V3 segment. The right vertebral artery is patent from its origin to the skull base. No evidence of dissection. Skeleton: No acute osseous abnormality. Degenerative changes in the cervical spine. ACDF C4-C7. Solid arthrodesis. Other neck: No acute finding. Upper chest: No focal pulmonary opacity or pleural effusion. Emphysema. Review of the MIP images confirms the above findings CTA HEAD FINDINGS Anterior circulation: Both internal carotid arteries are patent to the termini, with moderate stenosis in the proximal left cavernous ICA (series 8, image 125) and mild stenosis in the distal left supraclinoid ICA (series 8, image 109). A1 segments patent, hypoplastic on the right. Normal anterior communicating artery. Anterior cerebral arteries are patent to their distal aspects without significant stenosis. No M1 stenosis or occlusion. MCA branches perfused to their distal aspects without significant stenosis. Posterior circulation: The right vertebral artery is patent to the vertebrobasilar junction. The left V4 segment is not opacified proximally, with distal filling likely retrograde; this is favored to be chronic given the appearance on the 11/24/2022 MRI head. Bilateral posteroinferior  cerebellar arteries are patent proximally. Basilar is diminutive but patent to its distal aspect without significant stenosis. Superior cerebellar arteries patent proximally. Patent left P1. Aplastic right P1. Fetal origin of the right PCA from the right posterior communicating artery. Mild stenosis in the proximal right P2 (series 8, image 102). PCAs otherwise perfused to their distal aspects without significant stenosis. Venous sinuses: Not well opacified due to phase of timing. Anatomic variants: Fetal origin of the right PCA. No evidence of aneurysm or vascular malformation. Review of the MIP images confirms the above findings IMPRESSION: 1. 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 This is favored to be chronic given the loss of the proximal flow void on the 11/24/2022 MRI head. 2. No other intracranial large vessel occlusion. Moderate stenosis in the proximal left cavernous ICA and mild stenosis in the left supraclinoid ICA and proximal right P2. 3. No other hemodynamically significant stenosis in the neck. 4. Redemonstrated hypodensity in the left occipital lobe, which correlates with 1 of the acute infarcts seen on the same-day MRI. The other acute infarcts do not have definite CT correlates. 5. Aortic atherosclerosis (ICD10-I70.0) and Emphysema (ICD10-J43.9). Electronically Signed   By: Wiliam Ke M.D.   On: 11/15/2023 17:16   MR Brain W Wo Contrast Result Date: 11/15/2023 CLINICAL DATA:  Left-sided weakness EXAM: MRI HEAD WITHOUT AND WITH CONTRAST TECHNIQUE: Multiplanar, multiecho pulse sequences of the brain and surrounding structures were obtained without and with intravenous contrast. CONTRAST:  7.24mL GADAVIST GADOBUTROL 1 MMOL/ML IV SOLN COMPARISON:  Brain MR 11/24/22, CT head 11/14/23 FINDINGS: Brain: Acute infarcts in the posterior frontal lobe on the left (series 9, image 43) and left occipital lobe (series 9, image  23). There is mild diffusion restriction in the right posterior temporal lobe with associated contrast enhancement in the occipital temporal region and in the right parietal lobe, favored to represent enhancement related to subacute infarct. No hemorrhage. No mass effect.  No mass lesion. Chronic infarcts in the bilateral cerebellum. There is an additional nonspecific contrast-enhancing lesion in the right frontal lobe (series 20, image 101), which is nonspecific and may represent an additional site of a subacute infarct, but this site is without an associated correlate on T2/FLAIR weighted imaging. Vascular: Normal flow voids. Skull and upper cervical spine: Normal marrow signal. Sinuses/Orbits: Small left mastoid effusion. No middle ear effusion. Paranasal sinuses are notable for polypoid mucosal thickening in the right maxillary and right frontal sinus. Bilateral lens replacement. Orbits are otherwise unremarkable. Other: None. IMPRESSION: 1. Acute infarcts in the posterior frontal lobe on the left and left occipital lobe. No hemorrhage or mass effect. 2. Subacute infarcts in the right posterior temporal lobe and right parietal lobe. 3. Additional contrast-enhancing lesion in the anterior right frontal lobe may represent an additional site of a subacute infarct, but this site is without an associated correlate on T2/FLAIR weighted imaging. Recommend follow-up brain MRI in 6 weeks to ensure resolution. Findings were discussed with Dr. [name] on 11/15/23 at 9:30 AM. Electronically Signed   By: Lorenza Cambridge M.D.   On: 11/15/2023 09:42   CT HEAD WO CONTRAST ( ) Result Date: 11/14/2023 CLINICAL DATA:  Transient ischemic attack (TIA) EXAM: CT HEAD WITHOUT CONTRAST TECHNIQUE: Contiguous axial images were obtained from the base of the skull through the vertex without intravenous contrast. RADIATION DOSE REDUCTION: This exam was performed according to the departmental dose-optimization program which includes  automated exposure control, adjustment of the mA and/or kV according to patient size and/or use of iterative reconstruction technique. 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 There is an age indeterminate infarct in the left occipital lobe (series 2, image 22). No hemorrhage. No hydrocephalus. No extra-axial fluid collection. No mass effect. No mass lesion. Vascular: No hyperdense vessel or unexpected calcification. Skull: Normal. Negative for fracture or focal lesion. Sinuses/Orbits: No middle ear or mastoid effusion. Degenerative changes of the bilateral TMJs. Paranasal sinuses are notable for mucosal thickening in the right maxillary sinus. Bilateral lens replacement. Orbits are otherwise unremarkable. Other: None. IMPRESSION: Age indeterminate infarct in the left occipital lobe. Recommend brain MRI for further evaluation. These results will be called to the ordering clinician or representative by the Radiologist Assistant, and communication documented in the PACS or [employer]. Electronically Signed   By: Lorenza Cambridge M.D.   On: 11/14/2023 14:45    Assessment/Plan  1. Essential hypertension (Primary) -  BP low, will decrease Lisinopril 10 mg daily to 5 mg daily -  continue Metoprolol succinate 12.5 mg daily -  monitor BP - lisinopril (ZESTRIL) 5 MG tablet; Take 1 tablet (5 mg total) by mouth daily.  2. 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  Mixed hyperlipidemia -  continue Atorvastatin     Family/ staff Communication: Discussed plan of care with resident and charge nurse.  Labs/tests ordered:   None    Kenard Gower, DNP, MSN, FNP-BC [facility] [phone] (Monday-Friday 8:00 a.m. - 5:00 p.m.) [phone] (after hours)

## 2023-11-22 DIAGNOSIS — I639 Cerebral infarction, unspecified: Secondary | ICD-10-CM | POA: Diagnosis not present

## 2023-11-24 ENCOUNTER — Encounter: Payer: Medicare Other | Admitting: Nurse Practitioner

## 2023-11-28 ENCOUNTER — Telehealth: Payer: Self-pay | Admitting: Orthopedic Surgery

## 2023-11-28 ENCOUNTER — Telehealth: Payer: Self-pay | Admitting: Cardiovascular Disease

## 2023-11-28 NOTE — Telephone Encounter (Signed)
I saw him on 11/10/23 and ordered lumbar xrays. He was then admitted to hospital for stroke.   He is at [facility]. If he is still having LBP, then I recommend he still get the xrays done.   Can you please call him/[place] and see how he is doing?

## 2023-11-28 NOTE — Telephone Encounter (Signed)
Heather with [facility] said that they will try to have him out for xrays with in the next week.

## 2023-11-28 NOTE — Telephone Encounter (Signed)
Spoke with Lawyer at [facility] who cares for [name]. She received 2 heart monitors that patient will wear back to back. She did inquire about cardiology appointment and advised that monitor was ordered by neurology and results will go to them. She verbalized understanding with no further questions at this time.

## 2023-11-28 NOTE — Telephone Encounter (Signed)
I recommend he have lumbar xrays done. Once they are done, we can call with results and further plan.

## 2023-11-28 NOTE — Telephone Encounter (Signed)
I have an Charity fundraiser on the line from [facility] on the line requesting to speak with a nurse regarding patient

## 2023-12-02 ENCOUNTER — Ambulatory Visit
Admission: RE | Admit: 2023-12-02 | Discharge: 2023-12-02 | Disposition: A | Discharge status: Home / Self Care | Payer: Medicare Other | Source: Ambulatory Visit | Attending: Orthopedic Surgery | Admitting: Orthopedic Surgery

## 2023-12-02 DIAGNOSIS — M47816 Spondylosis without myelopathy or radiculopathy, lumbar region: Secondary | ICD-10-CM

## 2023-12-02 DIAGNOSIS — S32040S Wedge compression fracture of fourth lumbar vertebra, sequela: Secondary | ICD-10-CM | POA: Insufficient documentation

## 2023-12-02 DIAGNOSIS — S32020S Wedge compression fracture of second lumbar vertebra, sequela: Secondary | ICD-10-CM

## 2023-12-05 ENCOUNTER — Non-Acute Institutional Stay (SKILLED_NURSING_FACILITY): Payer: Self-pay | Admitting: Student

## 2023-12-05 ENCOUNTER — Encounter: Payer: Self-pay | Admitting: Student

## 2023-12-05 DIAGNOSIS — I10 Essential (primary) hypertension: Secondary | ICD-10-CM | POA: Diagnosis not present

## 2023-12-05 DIAGNOSIS — I69359 Hemiplegia and hemiparesis following cerebral infarction affecting unspecified side: Secondary | ICD-10-CM

## 2023-12-05 DIAGNOSIS — E782 Mixed hyperlipidemia: Secondary | ICD-10-CM

## 2023-12-05 DIAGNOSIS — I129 Hypertensive chronic kidney disease with stage 1 through stage 4 chronic kidney disease, or unspecified chronic kidney disease: Secondary | ICD-10-CM | POA: Diagnosis not present

## 2023-12-05 DIAGNOSIS — N183 Chronic kidney disease, stage 3 unspecified: Secondary | ICD-10-CM

## 2023-12-05 NOTE — Telephone Encounter (Signed)
 He had xrays on 12/02/23, results are pending.

## 2023-12-05 NOTE — Progress Notes (Signed)
 Location:  [facility] Home Room Number: 502A Place of Service:  SNF (31) Provider:  Abdul Abdul, Richerd, MD  Patient Care Team: Abdul Richerd, MD as PCP - General (Family Medicine) [name], MD as Consulting Physician (Urology) [name], MD as Consulting Physician (Oncology) [name], MD as Referring Physician (Vascular Surgery)  Extended Emergency Contact Information Primary Emergency Contact: warner,[name] Mobile Phone: [phone] Relation: Daughter Interpreter needed? No Secondary Emergency Contact: [name],[name] Home Phone: [phone] Mobile Phone: [phone] Relation: Daughter  Code Status:  DNR Goals of care: Advanced Directive information    11/15/2023    9:47 PM  Advanced Directives  Would patient like information on creating a medical advance directive? No - Patient declined     Chief Complaint  Patient presents with   Admission   Discussed the use of AI scribe software for clinical note transcription with the patient, who gave verbal consent to proceed.  History of Present Illness    Pt is a 83 y.o. male seen today for an Admission.  Patient was recently admitted for a stroke affecting the left side and increase confusion. The patient, with a recent history of stroke, presented with initial left-sided weakness and difficulty manipulating objects with their left hand. These symptoms have since resolved, and the patient reports no current issues with writing or hand function. They also had a heart monitor placed post-hospitalization, which is still in place, to monitor for any cardiac abnormalities that may have contributed to the stroke. The patient denies any history of atrial fibrillation and has not been on any blood thinners.  The patient also reported a persistent cough, but denied any issues with swallowing or speaking. They have been working with physical and occupational therapy several times a week since their  return from the hospital. Despite the cough, the patient declined further evaluation with a barium swallow study or speech therapy, stating they did not feel it was necessary.  The patient's medications include lisinopril  for blood pressure and a statin for cholesterol management. They expressed a desire to avoid extraordinary measures in the event of a severe health decline and stated a preference for comfort measures. They also expressed a desire not to return to the hospital.   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 chin. Nodular and infiltrative patterns.EDC.   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 Nodular. EDC   Basal cell carcinoma 08/26/2020   Right mid to inf. ear helix. Nodular, ulcerated. 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 Ulcerated.   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   L upper eyelid, exc and ED, pt had BCC of L upper eyelid and excision also included this SCC   Past Surgical History:  Procedure Laterality Date   BACK SURGERY     CATARACT EXTRACTION     HERNIA REPAIR     PORTA CATH INSERTION N/A 01/01/2019  Procedure: PORTA CATH INSERTION;  Surgeon: [name], MD;  Location: [facility] INVASIVE CV LAB;  Service: Cardiovascular;  Laterality: N/A;   TONSILLECTOMY     TRANSURETHRAL RESECTION OF BLADDER TUMOR N/A 12/08/2018   Procedure: TRANSURETHRAL RESECTION OF BLADDER TUMOR (TURBT);  Surgeon: [name], MD;  Location: [facility] ORS;   Service: Urology;  Laterality: N/A;    No Known Allergies  Outpatient Encounter Medications as of 12/05/2023  Medication Sig   aspirin  EC 81 MG tablet Take 1 tablet (81 mg total) by mouth daily. Swallow whole.   atorvastatin  (LIPITOR) 40 MG tablet Take 1 tablet (40 mg total) by mouth at bedtime.   chlorhexidine (PERIDEX) 0.12 % solution Use as directed in the mouth or throat at bedtime. 0.5 ounce by mouth at bedtime for mouth care (swish for 30 seconds and spit after evening mouth care. DO NOT RINSE.)   clopidogrel  (PLAVIX ) 75 MG tablet Take 1 tablet (75 mg total) by mouth daily for 21 days.   dextromethorphan-guaiFENesin  (MUCINEX  DM) 30-600 MG 12hr tablet Take 1 tablet by mouth 2 (two) times daily.   lidocaine  (LIDODERM ) 5 % Place 1 patch onto the skin daily. Remove & Discard patch within 12 hours or as directed by MD   lisinopril  (ZESTRIL ) 5 MG tablet Take 1 tablet (5 mg total) by mouth daily.   magnesium  chloride (SLOW-MAG) 64 MG TBEC SR tablet Take 1 tablet by mouth at bedtime.   metoprolol  succinate (TOPROL -XL) 25 MG 24 hr tablet Take 12.5 mg by mouth daily.   Multiple Vitamins-Minerals (CENTRUM MEN) TABS Take 1 tablet by mouth daily.   nystatin powder Apply to groin/affected areas topically as needed   ondansetron  (ZOFRAN ) 4 MG tablet Take 4 mg by mouth every 8 (eight) hours as needed for nausea or vomiting.   polyethylene glycol (MIRALAX  / GLYCOLAX ) 17 g packet Take 17 g by mouth daily as needed.   Vitamin D , Ergocalciferol , (DRISDOL ) 1.25 MG (50000 UT) CAPS capsule Take 50,000 Units by mouth every 7 (seven) days.   No facility-administered encounter medications on file as of 12/05/2023.    Review of Systems  Immunization History  Administered Date(s) Administered   Influenza Inj Mdck Quad Pf 08/25/2021, 08/31/2022   Influenza-Unspecified 08/27/2014, 09/03/2015, 08/31/2016, 08/29/2017, 09/21/2023   Moderna Covid-19 Fall Seasonal Vaccine 104yrs & older 03/08/2023   Moderna  Sars-Covid-2 Vaccination 12/11/2019, 01/08/2020, 10/08/2020, 08/20/2021   Pneumococcal Conjugate-13 03/21/2014, 06/02/2017   Pneumococcal Polysaccharide-23 04/08/2016   Tdap 12/05/2017   Unspecified SARS-COV-2 Vaccination 08/26/2023   Pertinent  Health Maintenance Due  Topic Date Due   INFLUENZA VACCINE  Completed      11/23/2022   10:33 PM 11/24/2022   10:00 PM 11/25/2022    8:30 PM 11/26/2022    9:00 AM 04/24/2023    9:06 PM  Fall Risk  Falls in the past year?     1  Was there an injury with Fall?     1  Fall Risk Category Calculator     3  (RETIRED) Patient Fall Risk Level High fall risk High fall risk High fall risk High fall risk   Patient at Risk for Falls Due to     History of fall(s);Impaired balance/gait;Impaired mobility  Fall risk Follow up     Falls prevention discussed   Functional Status Survey:    There were no vitals filed for this visit. There is no height or weight on file to calculate BMI. Physical Exam GENERAL: well-appearing, elderly gentleman CHEST: lungs  clear to auscultation CARDIOVASCULAR: heart rate regular, at normal rate ABDOMEN: abdomen soft, not distended No pedal edema Labs reviewed: Recent Labs    11/16/23 0552 11/17/23 0603 11/18/23 0559  NA 133* 133* 136  K 4.0 3.9 4.2  CL 102 100 101  CO2 24 24 26   GLUCOSE 88 84 93  BUN 22 25* 28*  CREATININE 1.29* 1.53* 1.45*  CALCIUM  9.1 9.1 9.1   Recent Labs    01/27/23 0000 09/29/23 0000 11/15/23 0928  AST 12* 17 24  ALT 11 19 25   ALKPHOS 91 95 112  BILITOT  --   --  0.8  PROT  --   --  7.5  ALBUMIN  3.3* 3.6 3.5   Recent Labs    11/15/23 0928 11/16/23 0552 11/17/23 0603 11/18/23 0559  WBC 9.6 10.3 10.4 10.5  NEUTROABS 6.8 6.9  --   --   HGB 14.9 13.3 13.5 14.3  HCT 46.2 40.7 40.4 42.7  MCV 93.0 90.8 89.2 89.9  PLT 482* 488* 489* 520*   No results found for: TSH Lab Results  Component Value Date   HGBA1C 5.6 11/15/2023   Lab Results  Component Value Date   CHOL  148 11/15/2023   HDL 42 11/15/2023   LDLCALC 90 11/15/2023   TRIG 79 11/15/2023   CHOLHDL 3.5 11/15/2023    Significant Diagnostic Results in last 30 days:  ECHOCARDIOGRAM COMPLETE Result Date: 11/16/2023    ECHOCARDIOGRAM REPORT   Patient Name:   GLEB MCGUIRE Date of Exam: 11/16/2023 Medical Rec #:  [id number]      Height:       68.0 in Accession #:    [id number]     Weight:       179.5 lb Date of Birth:  06-13-1941      BSA:          1.952 m Patient Age:    82 years       BP:           131/68 mmHg Patient Gender: M              HR:           71 bpm. Exam Location:  [facility] Procedure: 2D Echo, Cardiac Doppler, Color Doppler and Saline Contrast Bubble            Study Indications:     Stroke  History:         Patient has no prior history of Echocardiogram examinations.                  Stroke; Risk Factors:Hypertension. CKD.  Sonographer:     Naomie Reef Referring Phys:  [id number] [place] BROOM Diagnosing Phys: Evalene Lunger MD  Sonographer Comments: Image acquisition challenging due to respiratory motion. IMPRESSIONS  1. Left ventricular ejection fraction, by estimation, is 60 to 65%. The left ventricle has normal function. The left ventricle has no regional wall motion abnormalities. There is mild left ventricular hypertrophy. Left ventricular diastolic parameters are consistent with Grade I diastolic dysfunction (impaired relaxation).  2. Right ventricular systolic function is normal. The right ventricular size is normal.  3. The mitral valve is normal in structure. No evidence of mitral valve regurgitation. No evidence of mitral stenosis.  4. The aortic valve is normal in structure. Aortic valve regurgitation is not visualized. No aortic stenosis is present.  5. The inferior vena cava is normal in size with greater than 50% respiratory variability, suggesting right atrial pressure  of 3 mmHg. FINDINGS  Left Ventricle: Left ventricular ejection fraction, by estimation, is 60 to 65%. The left ventricle  has normal function. The left ventricle has no regional wall motion abnormalities. The left ventricular internal cavity size was normal in size. There is  mild left ventricular hypertrophy. Left ventricular diastolic parameters are consistent with Grade I diastolic dysfunction (impaired relaxation). Right Ventricle: The right ventricular size is normal. No increase in right ventricular wall thickness. Right ventricular systolic function is normal. Left Atrium: Left atrial size was normal in size. Right Atrium: Right atrial size was normal in size. Pericardium: There is no evidence of pericardial effusion. Mitral Valve: The mitral valve is normal in structure. No evidence of mitral valve regurgitation. No evidence of mitral valve stenosis. MV peak gradient, 5.8 mmHg. The mean mitral valve gradient is 3.0 mmHg. Tricuspid Valve: The tricuspid valve is normal in structure. Tricuspid valve regurgitation is not demonstrated. No evidence of tricuspid stenosis. Aortic Valve: The aortic valve is normal in structure. Aortic valve regurgitation is not visualized. No aortic stenosis is present. Aortic valve mean gradient measures 2.0 mmHg. Aortic valve peak gradient measures 4.5 mmHg. Aortic valve area, by VTI measures 2.62 cm. Pulmonic Valve: The pulmonic valve was normal in structure. Pulmonic valve regurgitation is not visualized. No evidence of pulmonic stenosis. Aorta: The aortic root is normal in size and structure. Venous: The inferior vena cava is normal in size with greater than 50% respiratory variability, suggesting right atrial pressure of 3 mmHg. IAS/Shunts: No atrial level shunt detected by color flow Doppler. Agitated saline contrast was given intravenously to evaluate for intracardiac shunting.  LEFT VENTRICLE PLAX 2D LVIDd:         3.50 cm   Diastology LVIDs:         2.50 cm   LV e' lateral:   6.64 cm/s LV PW:         1.40 cm   LV E/e' lateral: 14.7 LV IVS:        1.40 cm LVOT diam:     2.00 cm LV SV:          64 LV SV Index:   33 LVOT Area:     3.14 cm  RIGHT VENTRICLE RV Basal diam:  4.10 cm RV Mid diam:    3.10 cm RV S prime:     9.68 cm/s LEFT ATRIUM             Index        RIGHT ATRIUM           Index LA diam:        3.90 cm 2.00 cm/m   RA Area:     11.70 cm LA Vol (A2C):   67.9 ml 34.79 ml/m  RA Volume:   25.40 ml  13.01 ml/m LA Vol (A4C):   63.5 ml 32.54 ml/m LA Biplane Vol: 67.0 ml 34.33 ml/m  AORTIC VALVE                    PULMONIC VALVE AV Area (Vmax):    2.67 cm     PV Vmax:       0.85 m/s AV Area (Vmean):   2.26 cm     PV Peak grad:  2.9 mmHg AV Area (VTI):     2.62 cm AV Vmax:           106.00 cm/s AV Vmean:          67.000 cm/s AV  VTI:            0.243 m AV Peak Grad:      4.5 mmHg AV Mean Grad:      2.0 mmHg LVOT Vmax:         90.20 cm/s LVOT Vmean:        48.200 cm/s LVOT VTI:          0.203 m LVOT/AV VTI ratio: 0.84  AORTA Ao Root diam: 3.60 cm MITRAL VALVE MV Area (PHT): 2.29 cm     SHUNTS MV Area VTI:   1.81 cm     Systemic VTI:  0.20 m MV Peak grad:  5.8 mmHg     Systemic Diam: 2.00 cm MV Mean grad:  3.0 mmHg MV Vmax:       1.20 m/s MV Vmean:      77.4 cm/s MV Decel Time: 331 msec MV E velocity: 97.50 cm/s MV A velocity: 106.00 cm/s MV E/A ratio:  0.92 Evalene Lunger MD Electronically signed by Evalene Lunger MD Signature Date/Time: 11/16/2023/2:38:10 PM    Final    EEG adult Result Date: 11/16/2023 Shelton Arlin KIDD, MD     11/16/2023  1:23 PM Patient Name: NAM VOSSLER MRN: [id number] Epilepsy Attending: Arlin KIDD Shelton Referring Physician/Provider: Jerrie Lola CROME, MD Date: 11/16/2023 Duration: 28.08 mins Patient history: 83 y.o. man presenting with multifocal lesions on MRI brain. EEG to evaluate for seizure Level of alertness: Awake, asleep AEDs during EEG study: None Technical aspects: This EEG study was done with scalp electrodes positioned according to the 10-20 International system of electrode placement. Electrical activity was reviewed with band pass filter of 1-70Hz ,  sensitivity of 7 uV/mm, display speed of 10mm/sec with a 60Hz  notched filter applied as appropriate. EEG data were recorded continuously and digitally stored.  Video monitoring was available and reviewed as appropriate. Description: The posterior dominant rhythm consists of 8 Hz activity of moderate voltage (25-35 uV) seen predominantly in posterior head regions, symmetric and reactive to eye opening and eye closing. Sleep was characterized by vertex waves, sleep spindles (12 to 14 Hz), maximal frontocentral region. EEG showed continuous generalized and lateralized left hemisphere 5 to 6 Hz theta slowing admixed with intermittent 2-3hz  delta slowing. Hyperventilation and photic stimulation were not performed.   ABNORMALITY - Continuous slow, generalized and lateralized left hemisphere IMPRESSION: This study is suggestive of cortical dysfunction arising from left hemisphere likely secondary to underlying structural abnormality. Additionally there is moderate diffuse encephalopathy. No seizures or epileptiform discharges were seen throughout the recording. Arlin KIDD Shelton   CT CHEST ABDOMEN PELVIS W CONTRAST Result Date: 11/15/2023 CLINICAL DATA:  Evaluation for occult malignancy. 83 year old male presenting to the emergency department after outpatient MRI showed multiple acute strokes. EXAM: CT CHEST, ABDOMEN, AND PELVIS WITH CONTRAST TECHNIQUE: Multidetector CT imaging of the chest, abdomen and pelvis was performed following the standard protocol during bolus administration of intravenous contrast. RADIATION DOSE REDUCTION: This exam was performed according to the departmental dose-optimization program which includes automated exposure control, adjustment of the mA and/or kV according to patient size and/or use of iterative reconstruction technique. CONTRAST:  OMNIPAQUE  IOHEXOL  350 MG/ML SOLN COMPARISON:  PET/CT 01/02/2019 and CT abdomen and pelvis 11/23/2018 FINDINGS: CT CHEST FINDINGS Cardiovascular:  Normal heart size. No pericardial effusion. Coronary artery and aortic atherosclerotic calcification. Mitral annular calcification. Mediastinum/Nodes: Trachea and esophagus are unremarkable. No thoracic adenopathy. Lungs/Pleura: Centrilobular and paraseptal emphysema greatest in the upper lobes. Diffuse bronchial wall thickening greatest in the  lower lobes. No focal consolidation, pleural effusion, or pneumothorax. Musculoskeletal: No acute fracture or destructive osseous lesion. CT ABDOMEN PELVIS FINDINGS Hepatobiliary: No focal hepatic lesion. Cholelithiasis without evidence of cholecystitis. No biliary dilation. Pancreas: Fatty atrophy.  No acute abnormality. Spleen: Unremarkable. Adrenals/Urinary Tract: Normal adrenal glands. Bilateral renal atrophy. Decreased size of the cystic lesion in the anterior right kidney. This was previously characterized as a benign cyst. No follow-up recommended. 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 No urinary calculi or hydronephrosis. Stomach/Bowel: Stomach is within normal limits. No bowel obstruction or bowel wall thickening. Colonic diverticulosis without diverticulitis. Normal appendix. Vascular/Lymphatic: Aortic atherosclerosis. No enlarged abdominal or pelvic lymph nodes. Reproductive: Cysto prostatectomy. Other: No free intraperitoneal fluid or air. Musculoskeletal: Compression fracture of L2 and L4 similar to radiographs 04/27/2023. No acute fracture or destructive osseous lesion. IMPRESSION: 1. No evidence of malignancy in the chest, abdomen, or pelvis. 2. 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 3. Cholelithiasis. 4. COPD. Aortic Atherosclerosis (ICD10-I70.0) and Emphysema (ICD10-J43.9). Electronically Signed   By: Norman Gatlin M.D.   On: 11/15/2023 18:04   CT ANGIO HEAD NECK W WO CM Result Date: 11/15/2023 CLINICAL DATA:  Outpatient MRI shows multiple acute strokes EXAM: CT  ANGIOGRAPHY HEAD AND NECK WITH AND WITHOUT CONTRAST TECHNIQUE: Multidetector CT imaging of the head and neck was performed using the standard protocol during bolus administration of intravenous contrast. Multiplanar CT image reconstructions and MIPs were obtained to evaluate the vascular anatomy. Carotid stenosis measurements (when applicable) are obtained utilizing NASCET criteria, using the distal internal carotid diameter as the denominator. RADIATION DOSE REDUCTION: This exam was performed according to the departmental dose-optimization program which includes automated exposure control, adjustment of the mA and/or kV according to patient size and/or use of iterative reconstruction technique. CONTRAST:  OMNIPAQUE  IOHEXOL  350 MG/ML SOLN COMPARISON:  No prior CTA available, correlation is made with 11/14/2023 CT head and 11/15/2023 MRI head FINDINGS: CT HEAD FINDINGS Brain: Redemonstrated hypodensity in the left occipital lobe, which correlates with 1 of the acute infarcts seen on the same-day MRI. The other acute infarcts do not have definite CT correlates. Remote infarcts in the cerebellum. No evidence of acute hemorrhage, mass, mass effect, or midline shift. No hydrocephalus or extra-axial fluid collection. Vascular: No hyperdense vessel. Skull: Negative for fracture or focal lesion. Sinuses/Orbits: Right frontal mucous retention cyst. No acute finding in the orbits. Other: The mastoid air cells are well aerated. CTA NECK FINDINGS Aortic arch: Standard branching. Imaged portion shows no evidence of aneurysm or dissection. No significant stenosis of the major arch vessel origins. Aortic atherosclerosis. Right carotid system: No evidence of dissection, occlusion, or hemodynamically significant stenosis (greater than 50%). Atherosclerotic disease at the bifurcation and in the proximal ICA is not hemodynamically significant. Left carotid system: No evidence of dissection, occlusion, or hemodynamically  significant stenosis (greater than 50%). Atherosclerotic disease at the bifurcation and in the proximal ICA is not hemodynamically significant. Vertebral arteries: The left vertebral artery is not opacified at its origin or in the left V1, V2, or V3 segment. The right vertebral artery is patent from its origin to the skull base. No evidence of dissection. Skeleton: No acute osseous abnormality. Degenerative changes in the cervical spine. ACDF C4-C7. Solid arthrodesis. Other neck: No acute finding. Upper chest: No focal pulmonary opacity or pleural effusion. Emphysema. Review of the MIP images confirms the above findings CTA HEAD FINDINGS Anterior circulation: Both internal carotid arteries are patent to the  termini, with moderate stenosis in the proximal left cavernous ICA (series 8, image 125) and mild stenosis in the distal left supraclinoid ICA (series 8, image 109). A1 segments patent, hypoplastic on the right. Normal anterior communicating artery. Anterior cerebral arteries are patent to their distal aspects without significant stenosis. No M1 stenosis or occlusion. MCA branches perfused to their distal aspects without significant stenosis. Posterior circulation: The right vertebral artery is patent to the vertebrobasilar junction. The left V4 segment is not opacified proximally, with distal filling likely retrograde; this is favored to be chronic given the appearance on the 11/24/2022 MRI head. Bilateral posteroinferior cerebellar arteries are patent proximally. Basilar is diminutive but patent to its distal aspect without significant stenosis. Superior cerebellar arteries patent proximally. Patent left P1. Aplastic right P1. Fetal origin of the right PCA from the right posterior communicating artery. Mild stenosis in the proximal right P2 (series 8, image 102). PCAs otherwise perfused to their distal aspects without significant stenosis. Venous sinuses: Not well opacified due to phase of timing. Anatomic  variants: Fetal origin of the right PCA. No evidence of aneurysm or vascular malformation. Review of the MIP images confirms the above findings IMPRESSION: 1. 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 This is favored to be chronic given the loss of the proximal flow void on the 11/24/2022 MRI head. 2. No other intracranial large vessel occlusion. Moderate stenosis in the proximal left cavernous ICA and mild stenosis in the left supraclinoid ICA and proximal right P2. 3. No other hemodynamically significant stenosis in the neck. 4. Redemonstrated hypodensity in the left occipital lobe, which correlates with 1 of the acute infarcts seen on the same-day MRI. The other acute infarcts do not have definite CT correlates. 5. Aortic atherosclerosis (ICD10-I70.0) and Emphysema (ICD10-J43.9). Electronically Signed   By: Donald Campion M.D.   On: 11/15/2023 17:16   MR Brain W Wo Contrast Result Date: 11/15/2023 CLINICAL DATA:  Left-sided weakness EXAM: MRI HEAD WITHOUT AND WITH CONTRAST TECHNIQUE: Multiplanar, multiecho pulse sequences of the brain and surrounding structures were obtained without and with intravenous contrast. CONTRAST:  7.5mL GADAVIST  GADOBUTROL  1 MMOL/ML IV SOLN COMPARISON:  Brain MR 11/24/22, CT head 11/14/23 FINDINGS: Brain: Acute infarcts in the posterior frontal lobe on the left (series 9, image 43) and left occipital lobe (series 9, image 23). There is mild diffusion restriction in the right posterior temporal lobe with associated contrast enhancement in the occipital temporal region and in the right parietal lobe, favored to represent enhancement related to subacute infarct. No hemorrhage. No mass effect. No mass lesion. Chronic infarcts in the bilateral cerebellum. There is an additional nonspecific contrast-enhancing lesion in the right frontal lobe (series 20, image 101), which is nonspecific and may represent an  additional site of a subacute infarct, but this site is without an associated correlate on T2/FLAIR weighted imaging. Vascular: Normal flow voids. Skull and upper cervical spine: Normal marrow signal. Sinuses/Orbits: Small left mastoid effusion. No middle ear effusion. Paranasal sinuses are notable for polypoid mucosal thickening in the right maxillary and right frontal sinus. Bilateral lens replacement. Orbits are otherwise unremarkable. Other: None. IMPRESSION: 1. Acute infarcts in the posterior frontal lobe on the left and left occipital lobe. No hemorrhage or mass effect. 2. Subacute infarcts in the right posterior temporal lobe and right parietal lobe. 3. Additional contrast-enhancing lesion in the anterior right frontal lobe may represent an additional site of a subacute infarct,  but this site is without an associated correlate on T2/FLAIR weighted imaging. Recommend follow-up brain MRI in 6 weeks to ensure resolution. Findings were discussed with Dr. Abdul on 11/15/23 at 9:30 AM. Electronically Signed   By: Lyndall Gore M.D.   On: 11/15/2023 09:42   CT HEAD WO CONTRAST ( ) Result Date: 11/14/2023 CLINICAL DATA:  Transient ischemic attack (TIA) EXAM: CT HEAD WITHOUT CONTRAST TECHNIQUE: Contiguous axial images were obtained from the base of the skull through the vertex without intravenous contrast. RADIATION DOSE REDUCTION: This exam was performed according to the departmental dose-optimization program which includes automated exposure control, adjustment of the mA and/or kV according to patient size and/or use of iterative reconstruction technique. 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 There is an age indeterminate infarct in the left occipital lobe (series 2, image 22). No hemorrhage. No hydrocephalus. No extra-axial fluid collection. No mass effect. No mass lesion. Vascular: No hyperdense vessel or unexpected calcification. Skull:  Normal. Negative for fracture or focal lesion. Sinuses/Orbits: No middle ear or mastoid effusion. Degenerative changes of the bilateral TMJs. Paranasal sinuses are notable for mucosal thickening in the right maxillary sinus. Bilateral lens replacement. Orbits are otherwise unremarkable. Other: None. IMPRESSION: Age indeterminate infarct in the left occipital lobe. Recommend brain MRI for further evaluation. These results will be called to the ordering clinician or representative by the Radiologist Assistant, and communication documented in the PACS or [employer]. Electronically Signed   By: Lyndall Gore M.D.   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 No current issues with speech or swallowing. No seizures noted on EEG. -Continue current management.  Possible Atrial Fibrillation History of possible atrial fibrillation, currently wearing a heart monitor for further evaluation. No history of anticoagulant use. -Await results from heart monitor.  Hypertension and Hyperlipidemia Currently managed with Lisinopril  and statin therapy. -Continue Lisinopril  and statin therapy.  General Health Maintenance Discussed patient's preferences for future healthcare management, including hospitalization and advanced care measures. Patient expressed desire to avoid extraordinary measures. -Respect patient's wishes for future healthcare management.  Cough Patient reports occasional coughing. No signs of aspiration or pneumonia. Patient declined further evaluation with modified barium swallow study. -Monitor for signs of aspiration pneumonia.  Follow-up Plan to review heart monitor results in a few weeks.  Family/ staff Communication: nursing  Labs/tests ordered:  admission labs pending

## 2023-12-13 ENCOUNTER — Telehealth: Payer: Self-pay | Admitting: Orthopedic Surgery

## 2023-12-13 NOTE — Telephone Encounter (Signed)
 I did an addendum to his office note from 11/10/23 regarding his xrays.   Please print this and fax to [facility] so they are aware. I also spoke to his daughter Wynona Canes.   Thanks!

## 2023-12-22 LAB — VITAMIN D 25 HYDROXY (VIT D DEFICIENCY, FRACTURES): Vit D, 25-Hydroxy: 18

## 2023-12-27 ENCOUNTER — Telehealth: Payer: Self-pay | Admitting: Cardiovascular Disease

## 2023-12-27 NOTE — Telephone Encounter (Signed)
Results given to Dr. Mariah Milling for review.

## 2023-12-27 NOTE — Telephone Encounter (Signed)
Zio results printed and placed in Gollan's nurse box.

## 2023-12-27 NOTE — Telephone Encounter (Signed)
Received a call from [facility] that stated they wanted to make [place] aware of something they found on recent monitor results.   On 01/2/02025 at 12:41 AM 2.2 sec AV BLOCK.   Advised I would route to MD to make aware.

## 2023-12-27 NOTE — Telephone Encounter (Signed)
Irhtyhm on the phone with abn results

## 2024-01-03 ENCOUNTER — Non-Acute Institutional Stay (SKILLED_NURSING_FACILITY): Payer: Self-pay | Admitting: Nurse Practitioner

## 2024-01-03 ENCOUNTER — Encounter: Payer: Self-pay | Admitting: Nurse Practitioner

## 2024-01-03 DIAGNOSIS — N1831 Chronic kidney disease, stage 3a: Secondary | ICD-10-CM | POA: Diagnosis not present

## 2024-01-03 DIAGNOSIS — I69359 Hemiplegia and hemiparesis following cerebral infarction affecting unspecified side: Secondary | ICD-10-CM

## 2024-01-03 DIAGNOSIS — I10 Essential (primary) hypertension: Secondary | ICD-10-CM | POA: Diagnosis not present

## 2024-01-03 DIAGNOSIS — Z936 Other artificial openings of urinary tract status: Secondary | ICD-10-CM

## 2024-01-03 DIAGNOSIS — R7303 Prediabetes: Secondary | ICD-10-CM

## 2024-01-03 DIAGNOSIS — E782 Mixed hyperlipidemia: Secondary | ICD-10-CM

## 2024-01-03 DIAGNOSIS — E7800 Pure hypercholesterolemia, unspecified: Secondary | ICD-10-CM

## 2024-01-03 NOTE — Progress Notes (Signed)
 Location:  Other [place].  Nursing Home Room Number: [facility] [address]A Place of Service:  SNF [phone]) Harlene An, NP  PCP: Abdul Fine, MD  Patient Care Team: Abdul Fine, MD as PCP - General (Family Medicine) [name], MD as Consulting Physician (Urology) [name], MD as Consulting Physician (Oncology) [name], MD as Referring Physician (Vascular Surgery)  Extended Emergency Contact Information Primary Emergency Contact: warner,[name] Mobile Phone: [phone] Relation: Daughter Interpreter needed? No Secondary Emergency Contact: [name],[name] Home Phone: [phone] Mobile Phone: [phone] Relation: Daughter  Goals of care: Advanced Directive information    01/03/2024    9:02 AM  Advanced Directives  Does Patient Have a Medical Advance Directive? Yes  Type of Estate Agent of [place];Out of facility DNR (pink MOST or yellow form);Living will  Does patient want to make changes to medical advance directive? No - Patient declined  Copy of Healthcare Power of Attorney in Chart? Yes - validated most recent copy scanned in chart (See row information)     Chief Complaint  Patient presents with   Medical Management of Chronic Issues    Medical Management of Chronic Issues.     HPI:  Pt is a 83 y.o. male seen today for medical management of chronic disease.  Pt is doing well, basically to baseline after CVA- continues with slowed speech He has no concerns today Denies pain, depression or anxiety No shortness of breath or chest pains Nursing without concerns.    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 chin. Nodular and infiltrative patterns.EDC.   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 Nodular. EDC   Basal cell carcinoma 08/26/2020   Right mid to inf. ear helix. Nodular, ulcerated. 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 Ulcerated.   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   L upper eyelid, exc and ED, pt had BCC of L upper eyelid and excision also included this SCC   Past Surgical History:  Procedure Laterality Date   BACK SURGERY     CATARACT EXTRACTION     HERNIA REPAIR     PORTA CATH INSERTION N/A 01/01/2019   Procedure: PORTA CATH INSERTION;  Surgeon: [name], MD;  Location: [facility] INVASIVE CV LAB;  Service: Cardiovascular;  Laterality: N/A;   TONSILLECTOMY     TRANSURETHRAL RESECTION OF BLADDER TUMOR N/A 12/08/2018   Procedure: TRANSURETHRAL RESECTION OF BLADDER TUMOR (TURBT);  Surgeon: [name], MD;  Location: [facility] ORS;  Service: Urology;  Laterality: N/A;    No Known Allergies  Outpatient Encounter Medications as of 01/03/2024  Medication Sig   aluminum-magnesium  hydroxide 200-200 MG/5ML suspension Take 10 mLs by mouth every 4 (four) hours as needed for indigestion.   aspirin  EC 81 MG tablet Take 81 mg by mouth daily. Swallow whole.   atorvastatin  (LIPITOR) 40 MG tablet Take 1 tablet (40 mg total)  by mouth at bedtime.   chlorhexidine (PERIDEX) 0.12 % solution Use as directed in the mouth or throat at bedtime. 0.5 ounce by mouth at bedtime for mouth care (swish for 30 seconds and spit after evening mouth care. DO NOT RINSE.)   lidocaine  (LIDODERM ) 5 % Place 1 patch onto the skin daily. As needed. Remove & Discard patch within 12 hours or as directed by MD   lisinopril  (ZESTRIL ) 5 MG tablet Take 1 tablet (5 mg total) by mouth daily.    magnesium  chloride (SLOW-MAG) 64 MG TBEC SR tablet Take 1 tablet by mouth at bedtime.   metoprolol  succinate (TOPROL -XL) 25 MG 24 hr tablet Take 12.5 mg by mouth daily.   Multiple Vitamins-Minerals (CENTRUM MEN) TABS Take 1 tablet by mouth daily.   nystatin powder Apply to groin/affected areas topically as needed   Vitamin D , Ergocalciferol , (DRISDOL ) 1.25 MG (50000 UT) CAPS capsule Take 50,000 Units by mouth every 7 (seven) days.   [DISCONTINUED] dextromethorphan-guaiFENesin  (MUCINEX  DM) 30-600 MG 12hr tablet Take 1 tablet by mouth 2 (two) times daily. (Patient not taking: Reported on 01/03/2024)   [DISCONTINUED] ondansetron  (ZOFRAN ) 4 MG tablet Take 4 mg by mouth every 8 (eight) hours as needed for nausea or vomiting. (Patient not taking: Reported on 01/03/2024)   [DISCONTINUED] polyethylene glycol (MIRALAX  / GLYCOLAX ) 17 g packet Take 17 g by mouth daily as needed. (Patient not taking: Reported on 01/03/2024)   No facility-administered encounter medications on file as of 01/03/2024.    Review of Systems  Constitutional:  Negative for chills and fever.  HENT:  Negative for tinnitus.   Respiratory:  Negative for cough and shortness of breath.   Cardiovascular:  Negative for chest pain, palpitations and leg swelling.  Gastrointestinal:  Negative for abdominal pain, constipation and diarrhea.  Genitourinary:  Negative for dysuria, frequency and urgency.  Musculoskeletal:  Negative for back pain and myalgias.  Skin: Negative.   Neurological:  Negative for dizziness and headaches.     Immunization History  Administered Date(s) Administered   Influenza Inj Mdck Quad Pf 08/25/2021, 08/31/2022   Influenza-Unspecified 08/27/2014, 09/03/2015, 08/31/2016, 08/29/2017, 09/21/2023   Moderna Covid-19 Fall Seasonal Vaccine 85yrs & older 03/08/2023   Moderna Sars-Covid-2 Vaccination 12/11/2019, 01/08/2020, 10/08/2020, 08/20/2021   Pneumococcal Conjugate-13 03/21/2014, 06/02/2017   Pneumococcal  Polysaccharide-23 04/08/2016   Tdap 12/05/2017   Unspecified SARS-COV-2 Vaccination 08/26/2023   Pertinent  Health Maintenance Due  Topic Date Due   INFLUENZA VACCINE  Completed      11/23/2022   10:33 PM 11/24/2022   10:00 PM 11/25/2022    8:30 PM 11/26/2022    9:00 AM 04/24/2023    9:06 PM  Fall Risk  Falls in the past year?     1  Was there an injury with Fall?     1  Fall Risk Category Calculator     3  (RETIRED) Patient Fall Risk Level High fall risk High fall risk High fall risk High fall risk   Patient at Risk for Falls Due to     History of fall(s);Impaired balance/gait;Impaired mobility  Fall risk Follow up     Falls prevention discussed   Functional Status Survey:    Vitals:   01/03/24 0855  BP: 118/73  Pulse: 72  Resp: 18  Temp: (!) 97.1 F (36.2 C)  SpO2: 97%  Weight: 176 lb 8 oz (80.1 kg)  Height: 5' 8 (1.727 m)   Body mass index is 26.84 kg/m. Physical Exam Constitutional:  General: He is not in acute distress.    Appearance: He is well-developed. He is not diaphoretic.  HENT:     Head: Normocephalic and atraumatic.     Right Ear: External ear normal.     Left Ear: External ear normal.     Mouth/Throat:     Pharynx: No oropharyngeal exudate.  Eyes:     Conjunctiva/sclera: Conjunctivae normal.     Pupils: Pupils are equal, round, and reactive to light.  Cardiovascular:     Rate and Rhythm: Normal rate and regular rhythm.     Heart sounds: Normal heart sounds.  Pulmonary:     Effort: Pulmonary effort is normal.     Breath sounds: Normal breath sounds.  Abdominal:     General: Bowel sounds are normal.     Palpations: Abdomen is soft.  Musculoskeletal:        General: No tenderness.     Cervical back: Normal range of motion and neck supple.     Right lower leg: No edema.     Left lower leg: No edema.  Skin:    General: Skin is warm and dry.  Neurological:     Mental Status: He is alert and oriented to person, place, and time.      Labs reviewed: Recent Labs    11/16/23 0552 11/17/23 0603 11/18/23 0559  NA 133* 133* 136  K 4.0 3.9 4.2  CL 102 100 101  CO2 24 24 26   GLUCOSE 88 84 93  BUN 22 25* 28*  CREATININE 1.29* 1.53* 1.45*  CALCIUM  9.1 9.1 9.1   Recent Labs    01/27/23 0000 09/29/23 0000 11/15/23 0928  AST 12* 17 24  ALT 11 19 25   ALKPHOS 91 95 112  BILITOT  --   --  0.8  PROT  --   --  7.5  ALBUMIN  3.3* 3.6 3.5   Recent Labs    11/15/23 0928 11/16/23 0552 11/17/23 0603 11/18/23 0559  WBC 9.6 10.3 10.4 10.5  NEUTROABS 6.8 6.9  --   --   HGB 14.9 13.3 13.5 14.3  HCT 46.2 40.7 40.4 42.7  MCV 93.0 90.8 89.2 89.9  PLT 482* 488* 489* 520*   No results found for: TSH Lab Results  Component Value Date   HGBA1C 5.6 11/15/2023   Lab Results  Component Value Date   CHOL 148 11/15/2023   HDL 42 11/15/2023   LDLCALC 90 11/15/2023   TRIG 79 11/15/2023   CHOLHDL 3.5 11/15/2023    Significant Diagnostic Results in last 30 days:  LONG TERM MONITOR-LIVE TELEMETRY (3-14 DAYS) Result Date: 12/30/2023 Event monitor Patch Wear Time:  14 days and 0 hours (2024-12-24T10:41:56-0500 to 2025-01-07T10:41:56-0500) Normal sinus rhythm Patient had a min HR of 22 bpm, max HR of 148 bpm, and avg HR of 69 bpm. Several pauses occurred, the longest lasting 3 secs (20 bpm). Some Pauses occurred due to Second Degree AV Block-Mobitz I (Wenckebach) and Non-Conducted SVE(s). 1 episode(s) of AV Block (High Grade) occurred, lasting a total of 2 secs. Second Degree AV Block-Mobitz I (Wenckebach) was present.  Pauses were not triggered with patient's symptoms Pauses noted 10:50 PM, 12:41 AM, 2 AM, 3 AM Isolated SVEs were rare (<1.0%), SVE Triplets were rare (<1.0%),and no SVE Couplets were present. Isolated VEs were rare (<1.0%, 2451), VE Couplets were rare (<1.0%, 117), and VE Triplets were rare (<1.0%, 4). Ventricular Trigeminy was present. 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  Prediabetes Continue dietary modifications, will follow A1c  Hypercholesterolemia LDL goal <70, continues on Lipitor 40 mg daily. No myalgias noted.    Hemiparesis as late effect of cerebral infarction (HCC) Stable at this time. Minimal deficit after CVA, continues asa and statin      Dnya Hickle K. Caro BODILY [facility] [phone]

## 2024-01-04 DIAGNOSIS — I69359 Hemiplegia and hemiparesis following cerebral infarction affecting unspecified side: Secondary | ICD-10-CM | POA: Insufficient documentation

## 2024-01-04 NOTE — Assessment & Plan Note (Signed)
 Continue dietary modifications, will follow A1c

## 2024-01-04 NOTE — Assessment & Plan Note (Signed)
 LDL goal <70, continues on Lipitor 40 mg daily. No myalgias noted.

## 2024-01-04 NOTE — Assessment & Plan Note (Signed)
 Stable, Post bladder cancer, continue with foley cath.

## 2024-01-04 NOTE — Assessment & Plan Note (Signed)
 Chronic and stable Encourage proper hydration Follow metabolic panel Avoid nephrotoxic meds (NSAIDS)

## 2024-01-04 NOTE — Assessment & Plan Note (Signed)
 Blood pressure well controlled, goal bp <140/90 Continue current medications and dietary modifications follow metabolic panel

## 2024-01-04 NOTE — Assessment & Plan Note (Signed)
 Stable at this time. Minimal deficit after CVA, continues asa and statin

## 2024-01-15 DIAGNOSIS — I639 Cerebral infarction, unspecified: Secondary | ICD-10-CM | POA: Diagnosis not present

## 2024-01-16 ENCOUNTER — Encounter: Payer: Self-pay | Admitting: Cardiovascular Disease

## 2024-01-17 ENCOUNTER — Encounter: Payer: Self-pay | Admitting: Emergency Medicine

## 2024-02-02 ENCOUNTER — Other Ambulatory Visit: Payer: Self-pay | Admitting: Neurology

## 2024-02-02 DIAGNOSIS — I639 Cerebral infarction, unspecified: Secondary | ICD-10-CM

## 2024-02-02 DIAGNOSIS — R2681 Unsteadiness on feet: Secondary | ICD-10-CM

## 2024-02-03 ENCOUNTER — Ambulatory Visit

## 2024-02-03 ENCOUNTER — Encounter: Payer: Self-pay | Admitting: Cardiology

## 2024-02-03 ENCOUNTER — Ambulatory Visit: Payer: Medicare Other | Attending: Cardiology | Admitting: Cardiology

## 2024-02-03 VITALS — BP 128/82 | HR 79 | Ht 68.0 in | Wt 180.6 lb

## 2024-02-03 DIAGNOSIS — E7800 Pure hypercholesterolemia, unspecified: Secondary | ICD-10-CM | POA: Diagnosis present

## 2024-02-03 DIAGNOSIS — I10 Essential (primary) hypertension: Secondary | ICD-10-CM | POA: Insufficient documentation

## 2024-02-03 DIAGNOSIS — I455 Other specified heart block: Secondary | ICD-10-CM

## 2024-02-03 DIAGNOSIS — I440 Atrioventricular block, first degree: Secondary | ICD-10-CM | POA: Diagnosis not present

## 2024-02-03 NOTE — Progress Notes (Signed)
 Cardiology Office Note:    Date:  02/03/2024   ID:  Philip [name], DOB 24-Apr-1941, MRN [id number]  PCP:  Earnestine Mealing, MD   [facility] Providers Cardiologist:  None     Referring MD: Mychart, Generic Provid*   Chief Complaint  Patient presents with   AV Block    Patient is doing well on today with no complaints. Meds reviewed.     History of Present Illness:    Philip [name] is a 83 y.o. 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  Patient had a cardiac monitor placed 12/29/2023 due to CVA.  Episodes of sinus pauses noted about 10:51 PM, 1 AM, 2 AM lasting up to 3 seconds.  No A-fib or flutter noted.  He states arm strength has improved.  Denies dizziness, presyncope or syncope.  Denies chest pain or shortness of breath.  Ambulates with a walker or uses a wheelchair.  Echocardiogram 10/2023 EF 60 to 65%, impaired relaxation.  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 chin. Nodular and infiltrative patterns.EDC.   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 Nodular. EDC   Basal cell carcinoma 08/26/2020   Right mid to inf. ear helix. Nodular, ulcerated. 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 Ulcerated.   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   L  upper eyelid, exc and ED, pt had BCC of L upper eyelid and excision also included this SCC    Past Surgical History:  Procedure Laterality Date   BACK SURGERY     CATARACT EXTRACTION     HERNIA REPAIR     PORTA CATH INSERTION N/A 01/01/2019   Procedure: PORTA CATH INSERTION;  Surgeon: Annice Needy, MD;  Location: [facility] INVASIVE CV LAB;  Service: Cardiovascular;  Laterality: N/A;   TONSILLECTOMY     TRANSURETHRAL RESECTION OF BLADDER TUMOR N/A 12/08/2018   Procedure: TRANSURETHRAL RESECTION OF BLADDER TUMOR (TURBT);  Surgeon: Sondra Come, MD;  Location: [facility] ORS;  Service: Urology;  Laterality: N/A;    Current Medications: Current Meds  Medication Sig   aluminum-magnesium hydroxide 200-200 MG/5ML suspension Take 10 mLs by mouth every 4 (four) hours as needed for indigestion.   aspirin EC 81 MG tablet Take 81 mg by mouth daily. Swallow whole.   atorvastatin (LIPITOR) 40 MG tablet Take 1 tablet (40 mg total) by mouth at bedtime.   chlorhexidine (PERIDEX) 0.12 % solution Use as directed in the mouth or throat at bedtime. 0.5 ounce by mouth at bedtime for mouth care (swish for 30 seconds and spit after evening mouth care. DO NOT RINSE.)   lisinopril (ZESTRIL) 5 MG tablet Take 1 tablet (5 mg total) by mouth daily.   magnesium chloride (  SLOW-MAG) 64 MG TBEC SR tablet Take 1 tablet by mouth at bedtime.   Multiple Vitamins-Minerals (CENTRUM MEN) TABS Take 1 tablet by mouth daily.   nystatin powder Apply to groin/affected areas topically as needed   Vitamin D, Ergocalciferol, (DRISDOL) 1.25 MG (50000 UT) CAPS capsule Take 50,000 Units by mouth every 7 (seven) days.   [DISCONTINUED] metoprolol succinate (TOPROL-XL) 25 MG 24 hr tablet Take 12.5 mg by mouth daily.     Allergies:   Patient has no known allergies.   Social History   Socioeconomic History   Marital status: Widowed    Spouse name: Not on file   Number of children: 4   Years of education: Not on file   Highest education level:  Not on file  Occupational History   Not on file  Tobacco Use   Smoking status: Former    Current packs/day: 0.00    Types: Cigarettes    Start date: 11/29/1958    Quit date: 11/29/1993    Years since quitting: 30.2   Smokeless tobacco: Never  Vaping Use   Vaping status: Never Used  Substance and Sexual Activity   Alcohol use: Yes    Comment: a couple of drinks a day all the above   Drug use: Never   Sexual activity: Not Currently    Partners: Female  Other Topics Concern   Not on file  Social History Narrative   Not on file   Social Drivers of Health   Financial Resource Strain: Low Risk  (02/01/2024)   Received [facility]   Overall Financial Resource Strain (CARDIA)    Difficulty of Paying Living Expenses: Not hard at all  Food Insecurity: No Food Insecurity (02/01/2024)   Received from [facility]   Hunger Vital Sign    Worried About Running Out of Food in the Last Year: Never true    Ran Out of Food in the Last Year: Never true  Transportation Needs: No Transportation Needs (02/01/2024)   Received from [facility] - Transportation    In the past 12 months, has lack of transportation kept you from medical appointments or from getting medications?: No    Lack of Transportation (Non-Medical): No  Physical Activity: Not on file  Stress: Not on file  Social Connections: Not on file     Family History: The patient's family history includes Heart failure in his mother.  ROS:   Please see the history of present illness.     All other systems reviewed and are negative.  EKGs/Labs/Other Studies Reviewed:    The following studies were reviewed today:  EKG Interpretation Date/Time:  Friday February 03 2024 10:39:44 EST Ventricular Rate:  70 PR Interval:  386 QRS Duration:  80 QT Interval:  386 QTC Calculation: 416 R Axis:   -52  Text Interpretation: Sinus rhythm with 1st degree A-V block Left axis  deviation Low voltage QRS Confirmed by Debbe Odea ([id number]) on 02/03/2024 10:51:26 AM    Recent Labs: 11/15/2023: ALT 25 11/18/2023: BUN 28; Creatinine, Ser 1.45; Hemoglobin 14.3; Platelets 520; Potassium 4.2; Sodium 136  Recent Lipid Panel    Component Value Date/Time   CHOL 148 11/15/2023 0928   TRIG 79 11/15/2023 0928   HDL 42 11/15/2023 0928   CHOLHDL 3.5 11/15/2023 0928   VLDL 16 11/15/2023 0928   LDLCALC 90 11/15/2023 0928     Risk Assessment/Calculations:  Physical Exam:    VS:  BP 128/82   Pulse 79   Ht 5\' 8"  (1.727 m)   Wt 180 lb 9.6 oz (81.9 kg)   SpO2 97%   BMI 27.46 kg/m     Wt Readings from Last 3 Encounters:  02/03/24 180 lb 9.6 oz (81.9 kg)  01/03/24 176 lb 8 oz (80.1 kg)  12/05/23 171 lb 8 oz (77.8 kg)     GEN:  Well nourished, well developed in no acute distress HEENT: Normal NECK: No JVD; No carotid bruits CARDIAC: RRR, no murmurs, rubs, gallops RESPIRATORY:  Clear to auscultation without rales, wheezing or rhonchi  ABDOMEN: Soft, non-tender, non-distended MUSCULOSKELETAL:  No edema; No deformity  SKIN: Warm and dry NEUROLOGIC:  Alert and oriented x 3 PSYCHIATRIC:  Normal affect   ASSESSMENT:    1. Sinus pause   2. First degree AV block   3. Primary hypertension   4. Pure hypercholesterolemia    PLAN:    In order of problems listed above:  Cardiac monitor showed sinus pauses occurring during sleep lasting up to 3 seconds.  Etiology likely increase autonomic tone during sleep.  Patient otherwise asymptomatic.  Echocardiogram 10/2023 with normal EF 60 to 65%.  Overall benign.  Will stop Toprol-XL 12.5 mg daily.  Recheck for any improvement with cardiac monitor in about 2 weeks. First-degree AV block noted on ECG.  Stop Toprol-XL as above. Hypertension, BP controlled, continue lisinopril 5 mg daily, stop Toprol-XL. Hyperlipidemia, recent CVA.  Continue aspirin, Lipitor.  Cholesterol controlled.  Follow-up in 2 to 3  months      Medication Adjustments/Labs and Tests Ordered: Current medicines are reviewed at length with the patient today.  Concerns regarding medicines are outlined above.  Orders Placed This Encounter  Procedures   LONG TERM MONITOR (3-14 DAYS)   EKG 12-Lead   EKG 12-Lead   No orders of the defined types were placed in this encounter.   Patient Instructions  Medication Instructions:  Your physician recommends the following medication changes.  STOP TAKING: Metoprolol    *If you need a refill on your cardiac medications before your next appointment, please call your pharmacy*   Lab Work: None ordered at this time  If you have labs (blood work) drawn today and your tests are completely normal, you will receive your results only by: MyChart Message (if you have MyChart) OR A paper copy in the mail If you have any lab test that is abnormal or we need to change your treatment, we will call you to review the results.   Testing/Procedures: Christena Deem- Long Term Monitor Instructions  Your physician has requested you wear a ZIO patch monitor for 7 days.  This is a single patch monitor. [employer] supplies one patch monitor per enrollment. Additional stickers are not available. Please do not apply patch if you will be having a Nuclear Stress Test, Echocardiogram, Cardiac CT, MRI, or Chest Xray during the period you would be wearing the monitor. The patch cannot be worn during these tests. You cannot remove and re-apply the ZIO XT patch monitor.  Your ZIO patch monitor will be mailed 3 day USPS to your address on file. It may take 3-5 days to receive your monitor after you have been enrolled.  Once you have received your monitor, please review the enclosed instructions. Your monitor has already been registered assigning a specific monitor serial # to you.  Billing and Patient Assistance Program Information  We have supplied [employer] with  any of your insurance information on file for  billing purposes. [employer] offers a sliding scale Patient Assistance Program for patients that do not have insurance, or whose insurance does not completely cover the cost of the ZIO monitor.  You must apply for the Patient Assistance Program to qualify for this discounted rate.  To apply, please call [employer] at [phone], select option 4, select option 2, ask to apply for Patient Assistance Program. Meredeth Ide will ask your household income, and how many people are in your household. They will quote your out-of-pocket cost based on that information.  [employer] will also be able to set up a 55-month, interest-free payment plan if needed.  Applying the monitor   Shave hair from upper left chest.  Hold abrader disc by orange tab. Rub abrader in 40 strokes over the upper left chest as indicated in your monitor instructions.  Clean area with 4 enclosed alcohol pads. Let dry.  Apply patch as indicated in monitor instructions. Patch will be placed under collarbone on left side of chest with arrow pointing upward.  Rub patch adhesive wings for 2 minutes. Remove white label marked "1". Remove the white label marked "2". Rub patch adhesive wings for 2 additional minutes.  While looking in a mirror, press and release button in center of patch. A small green light will flash 3-4 times. This will be your only indicator that the monitor has been turned on.  Do not shower for the first 24 hours. You may shower after the first 24 hours.  Press the button if you feel a symptom. You will hear a small click. Record Date, Time and Symptom in the Patient Logbook.  When you are ready to remove the patch, follow instructions on the last 2 pages of Patient Logbook. Stick patch monitor onto the last page of Patient Logbook.  Place Patient Logbook in the blue and white box. Use locking tab on box and tape box closed securely. The blue and white box has prepaid postage on it. Please place it in the mailbox as soon as possible.  Your physician should have your test results approximately 7 days after the monitor has been mailed back to [facility].  Call [facility] Customer Care at [phone] if you have questions regarding your ZIO XT patch monitor. Call them immediately if you see an orange light blinking on your monitor.  If your monitor falls off in less than 4 days, contact our Monitor department at [phone].  If your monitor becomes loose or falls off after 4 days call [employer] at [phone] for suggestions on securing your monitor   Follow-Up: At [facility], you and your health needs are our priority.  As part of our continuing mission to provide you with exceptional heart care, we have created designated Provider Care Teams.  These Care Teams include your primary Cardiologist (physician) and Advanced Practice Providers (APPs -  Physician Assistants and Nurse Practitioners) who all work together to provide you with the care you need, when you need it.  We recommend signing up for the patient portal called "MyChart".  Sign up information is provided on this After Visit Summary.  MyChart is used to connect with patients for Virtual Visits (Telemedicine).  Patients are able to view lab/test results, encounter notes, upcoming appointments, etc.  Non-urgent messages can be sent to your provider as well.   To learn more about what you can do with MyChart, go to [email or web address].    Your next appointment:   6-8  week(s)  Provider:   Debbe Odea, MD    Signed, Debbe Odea, MD  02/03/2024 11:17 AM    [facility]

## 2024-02-03 NOTE — Patient Instructions (Signed)
 Medication Instructions:  Your physician recommends the following medication changes.  STOP TAKING: Metoprolol    *If you need a refill on your cardiac medications before your next appointment, please call your pharmacy*   Lab Work: None ordered at this time  If you have labs (blood work) drawn today and your tests are completely normal, you will receive your results only by: MyChart Message (if you have MyChart) OR A paper copy in the mail If you have any lab test that is abnormal or we need to change your treatment, we will call you to review the results.   Testing/Procedures: Christena Deem- Long Term Monitor Instructions  Your physician has requested you wear a ZIO patch monitor for 7 days.  This is a single patch monitor. [employer] supplies one patch monitor per enrollment. Additional stickers are not available. Please do not apply patch if you will be having a Nuclear Stress Test, Echocardiogram, Cardiac CT, MRI, or Chest Xray during the period you would be wearing the monitor. The patch cannot be worn during these tests. You cannot remove and re-apply the ZIO XT patch monitor.  Your ZIO patch monitor will be mailed 3 day USPS to your address on file. It may take 3-5 days to receive your monitor after you have been enrolled.  Once you have received your monitor, please review the enclosed instructions. Your monitor has already been registered assigning a specific monitor serial # to you.  Billing and Patient Assistance Program Information  We have supplied [employer] with any of your insurance information on file for billing purposes. [employer] offers a sliding scale Patient Assistance Program for patients that do not have insurance, or whose insurance does not completely cover the cost of the ZIO monitor.  You must apply for the Patient Assistance Program to qualify for this discounted rate.  To apply, please call [employer] at [phone], select option 4, select option 2, ask to apply for  Patient Assistance Program. Meredeth Ide will ask your household income, and how many people are in your household. They will quote your out-of-pocket cost based on that information.  [employer] will also be able to set up a 71-month, interest-free payment plan if needed.  Applying the monitor   Shave hair from upper left chest.  Hold abrader disc by orange tab. Rub abrader in 40 strokes over the upper left chest as indicated in your monitor instructions.  Clean area with 4 enclosed alcohol pads. Let dry.  Apply patch as indicated in monitor instructions. Patch will be placed under collarbone on left side of chest with arrow pointing upward.  Rub patch adhesive wings for 2 minutes. Remove white label marked "1". Remove the white label marked "2". Rub patch adhesive wings for 2 additional minutes.  While looking in a mirror, press and release button in center of patch. A small green light will flash 3-4 times. This will be your only indicator that the monitor has been turned on.  Do not shower for the first 24 hours. You may shower after the first 24 hours.  Press the button if you feel a symptom. You will hear a small click. Record Date, Time and Symptom in the Patient Logbook.  When you are ready to remove the patch, follow instructions on the last 2 pages of Patient Logbook. Stick patch monitor onto the last page of Patient Logbook.  Place Patient Logbook in the blue and white box. Use locking tab on box and tape box closed securely. The blue and white  box has prepaid postage on it. Please place it in the mailbox as soon as possible. Your physician should have your test results approximately 7 days after the monitor has been mailed back to [facility].  Call [facility] Customer Care at [phone] if you have questions regarding your ZIO XT patch monitor. Call them immediately if you see an orange light blinking on your monitor.  If your monitor falls off in less than 4 days, contact our Monitor  department at [phone].  If your monitor becomes loose or falls off after 4 days call [employer] at [phone] for suggestions on securing your monitor   Follow-Up: At [facility], you and your health needs are our priority.  As part of our continuing mission to provide you with exceptional heart care, we have created designated Provider Care Teams.  These Care Teams include your primary Cardiologist (physician) and Advanced Practice Providers (APPs -  Physician Assistants and Nurse Practitioners) who all work together to provide you with the care you need, when you need it.  We recommend signing up for the patient portal called "MyChart".  Sign up information is provided on this After Visit Summary.  MyChart is used to connect with patients for Virtual Visits (Telemedicine).  Patients are able to view lab/test results, encounter notes, upcoming appointments, etc.  Non-urgent messages can be sent to your provider as well.   To learn more about what you can do with MyChart, go to [email or web address].    Your next appointment:   6-8 week(s)  Provider:   Debbe Odea, MD

## 2024-02-08 ENCOUNTER — Encounter: Payer: Self-pay | Admitting: Student

## 2024-02-08 ENCOUNTER — Non-Acute Institutional Stay (SKILLED_NURSING_FACILITY): Payer: Self-pay | Admitting: Student

## 2024-02-08 DIAGNOSIS — I69359 Hemiplegia and hemiparesis following cerebral infarction affecting unspecified side: Secondary | ICD-10-CM

## 2024-02-08 DIAGNOSIS — I10 Essential (primary) hypertension: Secondary | ICD-10-CM

## 2024-02-08 DIAGNOSIS — I129 Hypertensive chronic kidney disease with stage 1 through stage 4 chronic kidney disease, or unspecified chronic kidney disease: Secondary | ICD-10-CM

## 2024-02-08 DIAGNOSIS — E782 Mixed hyperlipidemia: Secondary | ICD-10-CM | POA: Diagnosis not present

## 2024-02-08 DIAGNOSIS — N183 Chronic kidney disease, stage 3 unspecified: Secondary | ICD-10-CM

## 2024-02-08 NOTE — Progress Notes (Signed)
 Location:  Other [place].  Nursing Home Room Number: [facility] 502A Place of Service:  SNF [id number]) Provider:  Earnestine Mealing, MD  Patient Care Team: Earnestine Mealing, MD as PCP - General (Family Medicine) Sondra Come, MD as Consulting Physician (Urology) Jeralyn Ruths, MD as Consulting Physician (Oncology) Wyn Quaker Marlow Baars, MD as Referring Physician (Vascular Surgery)  Extended Emergency Contact Information Primary Emergency Contact: warner,[name] Mobile Phone: [phone] Relation: Daughter Interpreter needed? No Secondary Emergency Contact: [name],[name] Home Phone: [phone] Mobile Phone: [phone] Relation: Daughter  Code Status:  DNR Goals of care: Advanced Directive information    01/03/2024    9:02 AM  Advanced Directives  Does Patient Have a Medical Advance Directive? Yes  Type of Estate agent of [place];Out of facility DNR (pink MOST or yellow form);Living will  Does patient want to make changes to medical advance directive? No - Patient declined  Copy of Healthcare Power of Attorney in Chart? Yes - validated most recent copy scanned in chart (See row information)     Chief Complaint  Patient presents with   Medical Management of Chronic Issues    Medical Management of Chronic Issues.     HPI:  Pt is a 83 y.o. male seen today for medical management of chronic diseases.   Patient states he is fine without acute concerns at this time. Enjoys participating in various games and activities on the hall. Strength has improved since his stroke. Recent evaluation with cardiology -- no atrial fibrillation on continuous cardiac monitoring. Metoprolol was stopped due to evidence of sinus pause and AV block.   Seen by neurology for follow up after stroke. On ASA 81 mg for secondary prevention. MRI ordered for reevaluation. 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 chin. Nodular and infiltrative patterns.EDC.   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 Nodular. EDC   Basal cell carcinoma 08/26/2020   Right mid to inf. ear helix. Nodular, ulcerated. 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 Ulcerated.   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   L upper eyelid, exc and ED, pt had BCC of L upper eyelid and excision also included this SCC   Past Surgical History:  Procedure Laterality Date   BACK SURGERY     CATARACT EXTRACTION     HERNIA REPAIR     PORTA CATH INSERTION N/A 01/01/2019   Procedure: PORTA CATH INSERTION;  Surgeon: Annice Needy, MD;  Location: [facility] INVASIVE CV LAB;  Service: Cardiovascular;  Laterality: N/A;   TONSILLECTOMY     TRANSURETHRAL RESECTION OF BLADDER TUMOR N/A 12/08/2018   Procedure: TRANSURETHRAL RESECTION OF BLADDER TUMOR (TURBT);  Surgeon: Sondra Come, MD;  Location: [facility] ORS;  Service: Urology;  Laterality: N/A;    No Known Allergies  Outpatient Encounter Medications as of 02/08/2024  Medication Sig  aluminum-magnesium hydroxide 200-200 MG/5ML suspension Take 10 mLs by mouth every 4 (four) hours as needed for indigestion.   aspirin EC 81 MG tablet Take 81 mg by mouth daily. Swallow whole.   atorvastatin (LIPITOR) 40 MG tablet Take 1 tablet (40 mg total) by mouth at bedtime.   chlorhexidine (PERIDEX) 0.12 % solution Use as directed in the mouth or throat at bedtime. 0.5 ounce by mouth at bedtime for  mouth care (swish for 30 seconds and spit after evening mouth care. DO NOT RINSE.)   lisinopril (ZESTRIL) 5 MG tablet Take 1 tablet (5 mg total) by mouth daily.   magnesium chloride (SLOW-MAG) 64 MG TBEC SR tablet Take 1 tablet by mouth at bedtime.   Multiple Vitamins-Minerals (CENTRUM MEN) TABS Take 1 tablet by mouth daily.   nystatin powder Apply to groin/affected areas topically as needed   Vitamin D, Ergocalciferol, (DRISDOL) 1.25 MG (50000 UT) CAPS capsule Take 50,000 Units by mouth every 7 (seven) days.   [DISCONTINUED] lidocaine (LIDODERM) 5 % Place 1 patch onto the skin daily. As needed. Remove & Discard patch within 12 hours or as directed by MD (Patient not taking: Reported on 02/08/2024)   No facility-administered encounter medications on file as of 02/08/2024.    Review of Systems  Immunization History  Administered Date(s) Administered   Influenza Inj Mdck Quad Pf 08/25/2021, 08/31/2022   Influenza-Unspecified 08/27/2014, 09/03/2015, 08/31/2016, 08/29/2017, 09/21/2023   Moderna Covid-19 Fall Seasonal Vaccine 28yrs & older 03/08/2023   Moderna Sars-Covid-2 Vaccination 12/11/2019, 01/08/2020, 10/08/2020, 08/20/2021   Pneumococcal Conjugate-13 03/21/2014, 06/02/2017   Pneumococcal Polysaccharide-23 04/08/2016   Tdap 12/05/2017   Unspecified SARS-COV-2 Vaccination 08/26/2023   Pertinent  Health Maintenance Due  Topic Date Due   INFLUENZA VACCINE  06/29/2024      11/23/2022   10:33 PM 11/24/2022   10:00 PM 11/25/2022    8:30 PM 11/26/2022    9:00 AM 04/24/2023    9:06 PM  Fall Risk  Falls in the past year?     1  Was there an injury with Fall?     1  Fall Risk Category Calculator     3  (RETIRED) Patient Fall Risk Level High fall risk High fall risk High fall risk High fall risk   Patient at Risk for Falls Due to     History of fall(s);Impaired balance/gait;Impaired mobility  Fall risk Follow up     Falls prevention discussed   Functional Status Survey:    Vitals:    02/08/24 0937  BP: 125/76  Pulse: 80  Resp: 18  Temp: (!) 97.5 F (36.4 C)  SpO2: 96%  Weight: 177 lb (80.3 kg)  Height: 5\' 8"  (1.727 m)   Body mass index is 26.91 kg/m. Physical Exam Constitutional:      Appearance: Normal appearance.  Cardiovascular:     Rate and Rhythm: Normal rate.     Pulses: Normal pulses.     Heart sounds: Normal heart sounds.  Pulmonary:     Effort: Pulmonary effort is normal.  Abdominal:     General: Abdomen is flat. Bowel sounds are normal.     Palpations: Abdomen is soft.  Musculoskeletal:        General: No swelling or tenderness.  Skin:    General: Skin is warm and dry.  Neurological:     Mental Status: He is alert and oriented to person, place, and time.     Gait: Gait normal.  Psychiatric:  Mood and Affect: Mood normal.     Labs reviewed: Recent Labs    11/16/23 0552 11/17/23 0603 11/18/23 0559  NA 133* 133* 136  K 4.0 3.9 4.2  CL 102 100 101  CO2 24 24 26   GLUCOSE 88 84 93  BUN 22 25* 28*  CREATININE 1.29* 1.53* 1.45*  CALCIUM 9.1 9.1 9.1   Recent Labs    09/29/23 0000 11/15/23 0928  AST 17 24  ALT 19 25  ALKPHOS 95 112  BILITOT  --  0.8  PROT  --  7.5  ALBUMIN 3.6 3.5   Recent Labs    11/15/23 0928 11/16/23 0552 11/17/23 0603 11/18/23 0559  WBC 9.6 10.3 10.4 10.5  NEUTROABS 6.8 6.9  --   --   HGB 14.9 13.3 13.5 14.3  HCT 46.2 40.7 40.4 42.7  MCV 93.0 90.8 89.2 89.9  PLT 482* 488* 489* 520*   No results found for: "TSH" Lab Results  Component Value Date   HGBA1C 5.6 11/15/2023   Lab Results  Component Value Date   CHOL 148 11/15/2023   HDL 42 11/15/2023   LDLCALC 90 11/15/2023   TRIG 79 11/15/2023   CHOLHDL 3.5 11/15/2023    Significant Diagnostic Results in last 30 days:  LONG TERM MONITOR (3-14 DAYS) Result Date: 03/03/2024 Patch Wear Time:  6 days and 20 hours (2025-03-16T14:36:39-0400 to 2025-03-23T10:38:49-0400) Patient had a min HR of 21 bpm, max HR of 141 bpm, and avg HR of 77  bpm. Predominant underlying rhythm was Sinus Rhythm. First Degree AV Block was present. 1 run of Ventricular Tachycardia occurred lasting 5 beats with a max rate of 141 bpm (avg 135 bpm). Second Degree AV Block-Mobitz I (Wenckebach) was present. Isolated SVEs were rare (<1.0%), and no SVE Couplets or SVE Triplets were present. Isolated VEs were rare (<1.0%, 2877), VE Couplets were rare (<1.0%, 77), and VE Triplets were rare (<1.0%, 1). Ventricular Bigeminy and Trigeminy were present. Conclusion Average heart rate 77, range 21-141. 1 episode of nonsustained VT. No sinus pauses. No atrial fibrillation or atrial flutter.    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 Graduated from PT since CVA 10/2023. Continued cardiac and neurology work up and follow up. BP well-controlled at this time, conitnue current medications. Cholesterol well-controlled at this time continue for primary prevention. Continue ASA for secondary prevention of stroke. Doing well at this time and continuing to improve. Continue monitoring BP and for signs of acute stroke. Continue GOC conversations.   Family/ staff Communication: nursing  Labs/tests ordered:  Annual TSH, A1c Lipid, q49mo CMP, CBC - due in May.

## 2024-03-03 DIAGNOSIS — I455 Other specified heart block: Secondary | ICD-10-CM | POA: Diagnosis not present

## 2024-03-05 ENCOUNTER — Encounter: Payer: Self-pay | Admitting: *Deleted

## 2024-03-06 ENCOUNTER — Ambulatory Visit
Admission: RE | Admit: 2024-03-06 | Discharge: 2024-03-06 | Disposition: A | Discharge status: Home / Self Care | Source: Ambulatory Visit | Attending: Neurology | Admitting: Neurology

## 2024-03-06 ENCOUNTER — Encounter: Payer: Self-pay | Admitting: Student

## 2024-03-06 ENCOUNTER — Encounter: Payer: Self-pay | Admitting: Oncology

## 2024-03-06 ENCOUNTER — Encounter: Payer: Self-pay | Admitting: Emergency Medicine

## 2024-03-06 DIAGNOSIS — R2681 Unsteadiness on feet: Secondary | ICD-10-CM

## 2024-03-06 DIAGNOSIS — I639 Cerebral infarction, unspecified: Secondary | ICD-10-CM

## 2024-03-15 ENCOUNTER — Non-Acute Institutional Stay: Payer: Self-pay | Admitting: Nurse Practitioner

## 2024-03-15 DIAGNOSIS — Z0000 Encounter for general adult medical examination without abnormal findings: Secondary | ICD-10-CM

## 2024-03-15 NOTE — Progress Notes (Signed)
 Subjective:   Philip [name] is a 83 y.o. male who presents for Medicare Annual/Subsequent preventive examination.  Visit Complete: In person TL   Cardiac Risk Factors include: advanced age (>76men, >98 women);male gender;dyslipidemia;sedentary lifestyle;hypertension     Objective:    Today's Vitals   03/15/24 1512  BP: 102/68  Pulse: 86  Temp: 98.7 F (37.1 C)  Weight: 181 lb (82.1 kg)   Body mass index is 27.52 kg/m.     01/03/2024    9:02 AM 11/15/2023    9:47 PM 11/15/2023    9:26 AM 11/11/2023   11:03 AM 11/10/2023    4:08 PM 09/13/2023    9:26 AM 07/22/2023   11:28 AM  Advanced Directives  Does Patient Have a Medical Advance Directive? Yes  No Yes Yes Yes Yes  Type of Estate agent of [place];Out of facility DNR (pink MOST or yellow form);Living will   Healthcare Power of [place];Living will;Out of facility DNR (pink MOST or yellow form) Healthcare Power of [place];Out of facility DNR (pink MOST or yellow form);Living will Healthcare Power of [place];Out of facility DNR (pink MOST or yellow form);Living will Healthcare Power of [place];Living will;Out of facility DNR (pink MOST or yellow form)  Does patient want to make changes to medical advance directive? No - Patient declined   No - Patient declined No - Patient declined No - Patient declined No - Patient declined  Copy of Healthcare Power of Attorney in Chart? Yes - validated most recent copy scanned in chart (See row information)   Yes - validated most recent copy scanned in chart (See row information) Yes - validated most recent copy scanned in chart (See row information) Yes - validated most recent copy scanned in chart (See row information) Yes - validated most recent copy scanned in chart (See row information)  Would patient like information on creating a medical advance directive?  No - Patient declined         Current Medications (verified) Outpatient Encounter Medications as of  03/15/2024  Medication Sig   aluminum-magnesium hydroxide 200-200 MG/5ML suspension Take 10 mLs by mouth every 4 (four) hours as needed for indigestion.   aspirin EC 81 MG tablet Take 81 mg by mouth daily. Swallow whole.   atorvastatin (LIPITOR) 40 MG tablet Take 1 tablet (40 mg total) by mouth at bedtime.   chlorhexidine (PERIDEX) 0.12 % solution Use as directed in the mouth or throat at bedtime. 0.5 ounce by mouth at bedtime for mouth care (swish for 30 seconds and spit after evening mouth care. DO NOT RINSE.)   lisinopril (ZESTRIL) 5 MG tablet Take 1 tablet (5 mg total) by mouth daily.   magnesium chloride (SLOW-MAG) 64 MG TBEC SR tablet Take 1 tablet by mouth at bedtime.   Multiple Vitamins-Minerals (CENTRUM MEN) TABS Take 1 tablet by mouth daily.   nystatin powder Apply to groin/affected areas topically as needed   Vitamin D, Ergocalciferol, (DRISDOL) 1.25 MG (50000 UT) CAPS capsule Take 50,000 Units by mouth every 7 (seven) days.   No facility-administered encounter medications on file as of 03/15/2024.    Allergies (verified) Patient has no known allergies.   History: Past Medical History:  Diagnosis Date   Basal cell carcinoma 10/02/2018   Left lat. chin. Nodular and infiltrative patterns.EDC.   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 Nodular. EDC   Basal cell carcinoma 08/26/2020   Right mid to inf. ear helix. Nodular, ulcerated. 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 Ulcerated.   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HCC)    Hypertension    Squamous  cell carcinoma of skin 05/18/2022   L upper eyelid, exc and ED, pt had BCC of L upper eyelid and excision also included this SCC   Past Surgical History:  Procedure Laterality Date   BACK SURGERY     CATARACT EXTRACTION     HERNIA REPAIR     PORTA CATH INSERTION N/A 01/01/2019   Procedure: PORTA CATH INSERTION;  Surgeon: Celso College, MD;  Location: [facility] INVASIVE CV LAB;  Service: Cardiovascular;  Laterality: N/A;   TONSILLECTOMY     TRANSURETHRAL RESECTION OF BLADDER TUMOR N/A 12/08/2018   Procedure: TRANSURETHRAL RESECTION OF BLADDER TUMOR (TURBT);  Surgeon: Lawerence Pressman, MD;  Location: [facility] ORS;  Service: Urology;  Laterality: N/A;   Family History  Problem Relation Age of Onset   Heart failure Mother    Social History   Socioeconomic History   Marital status: Widowed    Spouse name: Not on file   Number of children: 4   Years of education: Not on file   Highest education level: Not on file  Occupational History   Not on file  Tobacco Use   Smoking status: Former    Current packs/day: 0.00    Types: Cigarettes    Start date: 11/29/1958    Quit date: 11/29/1993    Years since quitting: 30.3   Smokeless tobacco: Never  Vaping Use   Vaping status: Never Used  Substance and Sexual Activity   Alcohol use: Yes    Comment: a couple of drinks a day all the above   Drug use: Never   Sexual activity: Not Currently    Partners: Female  Other Topics Concern   Not on file  Social History Narrative   Not on file   Social Drivers of Health   Financial Resource Strain: Low Risk  (02/01/2024)   Received from [facility]   Overall Financial Resource Strain (CARDIA)    Difficulty of Paying Living Expenses: Not hard at all  Food Insecurity: No Food Insecurity (02/01/2024)   Received from [facility]   Hunger Vital Sign    Worried About Running Out of Food in the Last Year: Never true    Ran Out of Food in the Last Year: Never true   Transportation Needs: No Transportation Needs (02/01/2024)   Received from [facility] - Transportation    In the past 12 months, has lack of transportation kept you from medical appointments or from getting medications?: No    Lack of Transportation (Non-Medical): No  Physical Activity: Not on file  Stress: Not on file  Social Connections: Not on file    Tobacco Counseling Counseling given: Not Answered   Clinical Intake:  Pre-visit preparation completed: Yes  Pain : No/denies pain     BMI - recorded: 27 Nutritional Status: BMI 25 -29 Overweight Nutritional Risks: None  How  often do you need to have someone help you when you read instructions, pamphlets, or other written materials from your doctor or pharmacy?: 2 - Rarely         Activities of Daily Living    03/15/2024    3:14 PM 11/15/2023    9:47 PM  In your present state of health, do you have any difficulty performing the following activities:  Hearing? 0 1  Vision? 0 0  Difficulty concentrating or making decisions? 1 0  Walking or climbing stairs? 1   Comment uses walker   Dressing or bathing? 1   Doing errands, shopping? 1 1  Preparing Food and eating ? N   Comment does not prepare food   Using the Toilet? N   In the past six months, have you accidently leaked urine? N   Do you have problems with loss of bowel control? N   Managing your Medications? Y   Managing your Finances? Y   Housekeeping or managing your Housekeeping? Y     Patient Care Team: Valrie Gehrig, MD as PCP - General (Family Medicine) Lawerence Pressman, MD as Consulting Physician (Urology) Shellie Dials, MD as Consulting Physician (Oncology) Vonna Guardian Donald Frost, MD as Referring Physician (Vascular Surgery)  Indicate any recent Medical Services you may have received from other than [facility] providers in the past year (date may be approximate).     Assessment:   This is a routine wellness examination for  Philip [name].  Hearing/Vision screen No results found.   Goals Addressed   None    Depression Screen    03/15/2024    3:17 PM  PHQ 2/9 Scores  PHQ - 2 Score 0    Fall Risk    03/15/2024    3:16 PM 04/24/2023    9:06 PM 02/01/2019    4:00 PM  Fall Risk   Falls in the past year? 1 1 0  Number falls in past yr: 0 1   Injury with Fall? 0 1   Risk for fall due to : History of fall(s);Impaired mobility History of fall(s);Impaired balance/gait;Impaired mobility   Follow up Falls evaluation completed;Education provided Falls prevention discussed     MEDICARE RISK AT HOME: Medicare Risk at Home Any stairs in or around the home?: Yes If so, are there any without handrails?: No Home free of loose throw rugs in walkways, pet beds, electrical cords, etc?: Yes Adequate lighting in your home to reduce risk of falls?: Yes Life alert?: No Use of a cane, walker or w/c?: Yes Grab bars in the bathroom?: Yes Shower chair or bench in shower?: Yes Elevated toilet seat or a handicapped toilet?: Yes  TIMED UP AND GO:  Was the test performed?  No    Cognitive Function:        Immunizations Immunization History  Administered Date(s) Administered   Influenza Inj Mdck Quad Pf 08/25/2021, 08/31/2022   Influenza-Unspecified 08/27/2014, 09/03/2015, 08/31/2016, 08/29/2017, 09/21/2023   Moderna Covid-19 Fall Seasonal Vaccine 67yrs & older 03/08/2023   Moderna Sars-Covid-2 Vaccination 12/11/2019, 01/08/2020, 10/08/2020, 08/20/2021   Pneumococcal Conjugate-13 03/21/2014, 06/02/2017   Pneumococcal Polysaccharide-23 04/08/2016   Tdap 12/05/2017   Unspecified SARS-COV-2 Vaccination 08/26/2023    TDAP status: Up to date  Flu Vaccine status: Up to date  Pneumococcal vaccine status: Up to date  Covid-19 vaccine status: Information provided on how to obtain vaccines.   Qualifies for Shingles Vaccine? Yes   Zostavax completed No   Shingrix Completed?: Yes  Screening Tests  Health Maintenance   Topic Date Due   COVID-19 Vaccine (7 - Moderna risk 2024-25 season) 02/23/2024   Zoster Vaccines- Shingrix (1 of 2) 05/10/2024 (Originally 08/18/1960)   INFLUENZA VACCINE  06/29/2024   Medicare Annual Wellness (AWV)  03/15/2025   DTaP/Tdap/Td (2 - Td or Tdap) 12/06/2027   Pneumonia Vaccine 37+ Years old  Completed   HPV VACCINES  Aged Out   Meningococcal B Vaccine  Aged Out    Health Maintenance  Health Maintenance Due  Topic Date Due   COVID-19 Vaccine (7 - Moderna risk 2024-25 season) 02/23/2024    Colorectal cancer screening: No longer required.   Lung Cancer Screening: (Low Dose CT Chest recommended if Age 25-80 years, 20 pack-year currently smoking OR have quit w/in 15years.) does not qualify.   Lung Cancer Screening Referral: na  Additional Screening:  Hepatitis C Screening: does not qualify; Completed   Vision Screening: Recommended annual ophthalmology exams for early detection of glaucoma and other disorders of the eye. Is the patient up to date with their annual eye exam?  No  Who is the provider or what is the name of the office in which the patient attends annual eye exams? unsure If pt is not established with a provider, would they like to be referred to a provider to establish care? Yes .   Dental Screening: Recommended annual dental exams for proper oral hygiene   Community Resource Referral / Chronic Care Management: CRR required this visit?  No   CCM required this visit?  No     Plan:     I have personally reviewed and noted the following in the patient's chart:   Medical and social history Use of alcohol, tobacco or illicit drugs  Current medications and supplements including opioid prescriptions. Patient is not currently taking opioid prescriptions. Functional ability and status Nutritional status Physical activity Advanced directives List of other physicians Hospitalizations, surgeries, and ER visits in previous 12  months Vitals Screenings to include cognitive, depression, and falls Referrals and appointments  In addition, I have reviewed and discussed with patient certain preventive protocols, quality metrics, and best practice recommendations. A written personalized care plan for preventive services as well as general preventive health recommendations were provided to patient.     Verma Gobble, NP   03/15/2024

## 2024-03-16 ENCOUNTER — Ambulatory Visit: Admitting: Cardiology

## 2024-03-20 ENCOUNTER — Non-Acute Institutional Stay (SKILLED_NURSING_FACILITY): Payer: Self-pay | Admitting: Nurse Practitioner

## 2024-03-20 ENCOUNTER — Encounter: Payer: Self-pay | Admitting: Nurse Practitioner

## 2024-03-20 DIAGNOSIS — I10 Essential (primary) hypertension: Secondary | ICD-10-CM | POA: Diagnosis not present

## 2024-03-20 DIAGNOSIS — R7303 Prediabetes: Secondary | ICD-10-CM | POA: Diagnosis not present

## 2024-03-20 DIAGNOSIS — Z936 Other artificial openings of urinary tract status: Secondary | ICD-10-CM

## 2024-03-20 DIAGNOSIS — I69359 Hemiplegia and hemiparesis following cerebral infarction affecting unspecified side: Secondary | ICD-10-CM

## 2024-03-20 DIAGNOSIS — E559 Vitamin D deficiency, unspecified: Secondary | ICD-10-CM

## 2024-03-20 DIAGNOSIS — E7800 Pure hypercholesterolemia, unspecified: Secondary | ICD-10-CM

## 2024-03-20 DIAGNOSIS — N1831 Chronic kidney disease, stage 3a: Secondary | ICD-10-CM

## 2024-03-20 NOTE — Progress Notes (Signed)
 Location:  Other [place].  Nursing Home Room Number: [facility] [address]A Place of Service:  SNF ([phone]) Gilbert Lab, NP  PCP: Valrie Gehrig, MD  Patient Care Team: Valrie Gehrig, MD as PCP - General (Family Medicine) Lawerence Pressman, MD as Consulting Physician (Urology) Shellie Dials, MD as Consulting Physician (Oncology) Vonna Guardian Donald Frost, MD as Referring Physician (Vascular Surgery)  Extended Emergency Contact Information Primary Emergency Contact: warner,[name] Mobile Phone: [phone] Relation: Daughter Interpreter needed? No Secondary Emergency Contact: [name],[name] Home Phone: [phone] Mobile Phone: [phone] Relation: Daughter  Goals of care: Advanced Directive information    03/20/2024   10:09 AM  Advanced Directives  Does Patient Have a Medical Advance Directive? Yes  Type of Estate agent of [place];Living will;Out of facility DNR (pink MOST or yellow form)  Does patient want to make changes to medical advance directive? No - Patient declined  Copy of Healthcare Power of Attorney in Chart? Yes - validated most recent copy scanned in chart (See row information)     Chief Complaint  Patient presents with   Medical Management of Chronic Issues    Medical Management of Chronic Issues.     HPI:  Pt is a 83 y.o. male seen today for medical management of chronic disease.  Pt with hx of CVA, htn, hyperlipidemia, CKD.  He is at [facility] for long term care. He still is able to tend to a lot of his own personal matters, bill, etcs.  Staff has no acute concerns with him at this time.  He denies pains.  He ambulates well with walker.  He has no complaints today. 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 chin. Nodular and infiltrative patterns.EDC.   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  Nodular. EDC   Basal cell carcinoma 08/26/2020   Right mid to inf. ear helix. Nodular, ulcerated. 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 Ulcerated.   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   L upper eyelid, exc and ED, pt had BCC of L upper eyelid and excision also included this SCC   Past Surgical History:  Procedure Laterality Date   BACK SURGERY     CATARACT EXTRACTION     HERNIA REPAIR     PORTA CATH INSERTION N/A 01/01/2019   Procedure: PORTA CATH INSERTION;  Surgeon: Celso College, MD;  Location: [facility] INVASIVE CV LAB;  Service: Cardiovascular;  Laterality: N/A;   TONSILLECTOMY     TRANSURETHRAL RESECTION OF BLADDER TUMOR N/A 12/08/2018   Procedure: TRANSURETHRAL RESECTION OF BLADDER TUMOR (TURBT);  Surgeon: Lawerence Pressman, MD;  Location: [facility] ORS;  Service: Urology;  Laterality: N/A;    No Known Allergies  Outpatient Encounter Medications as of 03/20/2024  Medication Sig   aluminum-magnesium  hydroxide 200-200 MG/5ML suspension Take 10 mLs by mouth  every 4 (four) hours as needed for indigestion.   aspirin  EC 81 MG tablet Take 81 mg by mouth daily. Swallow whole.   atorvastatin  (LIPITOR) 40 MG tablet Take 1 tablet (40 mg total) by mouth at bedtime.   chlorhexidine (PERIDEX) 0.12 % solution Use as directed in the mouth or throat at bedtime. 0.5 ounce by mouth at bedtime for mouth care (swish for 30 seconds and spit after evening mouth care. DO NOT RINSE.)   lisinopril  (ZESTRIL ) 5 MG tablet Take 1 tablet (5 mg total)  by mouth daily.   magnesium  chloride (SLOW-MAG) 64 MG TBEC SR tablet Take 1 tablet by mouth daily.   Multiple Vitamins-Minerals (CENTRUM MEN) TABS Take 1 tablet by mouth daily.   nystatin powder Apply to groin/affected areas topically as needed   polyethylene glycol (MIRALAX  / GLYCOLAX ) 17 g packet Take 17 g by mouth daily as needed.   Vitamin D , Ergocalciferol , (DRISDOL ) 1.25 MG (50000 UT) CAPS capsule Take 50,000 Units by mouth every 7 (seven) days.   [DISCONTINUED] magnesium  chloride (SLOW-MAG) 64 MG TBEC SR tablet Take 1 tablet by mouth at bedtime.   No facility-administered encounter medications on file as of 03/20/2024.    Review of Systems  Constitutional:  Negative for activity change, appetite change, fatigue and unexpected weight change.  HENT:  Negative for congestion and hearing loss.   Eyes: Negative.   Respiratory:  Negative for cough and shortness of breath.   Cardiovascular:  Negative for chest pain, palpitations and leg swelling.  Gastrointestinal:  Negative for abdominal pain, constipation and diarrhea.  Genitourinary:  Negative for difficulty urinating and dysuria.  Musculoskeletal:  Negative for arthralgias and myalgias.  Skin:  Negative for color change and wound.  Neurological:  Negative for dizziness and weakness.  Psychiatric/Behavioral:  Negative for agitation, behavioral problems and confusion.      Immunization History  Administered Date(s) Administered   Influenza Inj Mdck Quad Pf 08/25/2021, 08/31/2022   Influenza-Unspecified 08/27/2014, 09/03/2015, 08/31/2016, 08/29/2017, 09/21/2023   Moderna Covid-19 Fall Seasonal Vaccine 30yrs & older 03/08/2023   Moderna Sars-Covid-2 Vaccination 12/11/2019, 01/08/2020, 10/08/2020, 08/20/2021   Pneumococcal Conjugate-13 03/21/2014, 06/02/2017   Pneumococcal Polysaccharide-23 04/08/2016   Tdap 12/05/2017   Unspecified SARS-COV-2 Vaccination 08/26/2023   Pertinent  Health Maintenance Due  Topic Date Due   INFLUENZA  VACCINE  06/29/2024      11/24/2022   10:00 PM 11/25/2022    8:30 PM 11/26/2022    9:00 AM 04/24/2023    9:06 PM 03/15/2024    3:16 PM  Fall Risk  Falls in the past year?    1 1  Was there an injury with Fall?    1 0  Fall Risk Category Calculator    3 1  (RETIRED) Patient Fall Risk Level High fall risk High fall risk High fall risk    Patient at Risk for Falls Due to    History of fall(s);Impaired balance/gait;Impaired mobility History of fall(s);Impaired mobility  Fall risk Follow up    Falls prevention discussed Falls evaluation completed;Education provided   Functional Status Survey:    Vitals:   03/20/24 0934  BP: 112/71  Pulse: (!) 105  Resp: 20  Temp: 98.7 F (37.1 C)  SpO2: 97%  Weight: 181 lb 6.4 oz (82.3 kg)  Height: 5\' 8"  (1.727 m)   Body mass index is 27.58 kg/m. Physical Exam Constitutional:      General: He is not in acute distress.    Appearance: He is well-developed. He  is not diaphoretic.  HENT:     Head: Normocephalic and atraumatic.     Right Ear: External ear normal.     Left Ear: External ear normal.     Mouth/Throat:     Pharynx: No oropharyngeal exudate.  Eyes:     Conjunctiva/sclera: Conjunctivae normal.     Pupils: Pupils are equal, round, and reactive to light.  Cardiovascular:     Rate and Rhythm: Normal rate and regular rhythm.     Heart sounds: Normal heart sounds.  Pulmonary:     Effort: Pulmonary effort is normal.     Breath sounds: Normal breath sounds.  Abdominal:     General: Bowel sounds are normal.     Palpations: Abdomen is soft.  Musculoskeletal:        General: No tenderness.     Cervical back: Normal range of motion and neck supple.     Right lower leg: No edema.     Left lower leg: No edema.  Skin:    General: Skin is warm and dry.  Neurological:     Mental Status: He is alert and oriented to person, place, and time.     Labs reviewed: Recent Labs    11/16/23 0552 11/17/23 0603 11/18/23 0559  NA 133* 133*  136  K 4.0 3.9 4.2  CL 102 100 101  CO2 24 24 26   GLUCOSE 88 84 93  BUN 22 25* 28*  CREATININE 1.29* 1.53* 1.45*  CALCIUM  9.1 9.1 9.1   Recent Labs    09/29/23 0000 11/15/23 0928  AST 17 24  ALT 19 25  ALKPHOS 95 112  BILITOT  --  0.8  PROT  --  7.5  ALBUMIN  3.6 3.5   Recent Labs    11/15/23 0928 11/16/23 0552 11/17/23 0603 11/18/23 0559  WBC 9.6 10.3 10.4 10.5  NEUTROABS 6.8 6.9  --   --   HGB 14.9 13.3 13.5 14.3  HCT 46.2 40.7 40.4 42.7  MCV 93.0 90.8 89.2 89.9  PLT 482* 488* 489* 520*   No results found for: "TSH" Lab Results  Component Value Date   HGBA1C 5.6 11/15/2023   Lab Results  Component Value Date   CHOL 148 11/15/2023   HDL 42 11/15/2023   LDLCALC 90 11/15/2023   TRIG 79 11/15/2023   CHOLHDL 3.5 11/15/2023    Significant Diagnostic Results in last 30 days:  LONG TERM MONITOR (3-14 DAYS) Result Date: 03/03/2024 Patch Wear Time:  6 days and 20 hours (2025-03-16T14:36:39-0400 to 2025-03-23T10:38:49-0400) Patient had a min HR of 21 bpm, max HR of 141 bpm, and avg HR of 77 bpm. Predominant underlying rhythm was Sinus Rhythm. First Degree AV Block was present. 1 run of Ventricular Tachycardia occurred lasting 5 beats with a max rate of 141 bpm (avg 135 bpm). Second Degree AV Block-Mobitz I (Wenckebach) was present. Isolated SVEs were rare (<1.0%), and no SVE Couplets or SVE Triplets were present. Isolated VEs were rare (<1.0%, 2877), VE Couplets were rare (<1.0%, 77), and VE Triplets were rare (<1.0%, 1). Ventricular Bigeminy and Trigeminy were present. Conclusion Average heart rate 77, range 21-141. 1 episode of nonsustained VT. No sinus pauses. No atrial fibrillation or atrial flutter.    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   Prediabetes Continue dietary modifications. A1c at goal in December   Vitamin D  deficiency Continues on Vit D weekly supplement      Vikas Wegmann K. Denney Fisherman [facility] [phone]

## 2024-03-22 ENCOUNTER — Encounter: Payer: Self-pay | Admitting: Oncology

## 2024-03-22 DIAGNOSIS — E782 Mixed hyperlipidemia: Secondary | ICD-10-CM | POA: Insufficient documentation

## 2024-03-22 DIAGNOSIS — E559 Vitamin D deficiency, unspecified: Secondary | ICD-10-CM | POA: Insufficient documentation

## 2024-03-22 NOTE — Assessment & Plan Note (Signed)
 Continue lifestyle modifications with lipitor.  LDL at goal on last labs

## 2024-03-22 NOTE — Assessment & Plan Note (Signed)
 Continues on Vit D weekly supplement

## 2024-03-22 NOTE — Assessment & Plan Note (Signed)
 Blood pressure well controlled, goal bp <140/90 Continue current medications and dietary modifications follow metabolic panel

## 2024-03-22 NOTE — Assessment & Plan Note (Signed)
 Due to bladder cancer, draining well at this time.

## 2024-03-22 NOTE — Assessment & Plan Note (Signed)
 Continue dietary modifications. A1c at goal in December

## 2024-03-22 NOTE — Assessment & Plan Note (Signed)
 Stable at this time, significant improvement in weakness, doing very well with mobility and ADLS

## 2024-03-22 NOTE — Assessment & Plan Note (Signed)
 Chronic and stable Encourage proper hydration Follow metabolic panel Avoid nephrotoxic meds (NSAIDS)

## 2024-04-02 LAB — CBC AND DIFFERENTIAL
HCT: 41 (ref 41–53)
Hemoglobin: 13.1 — AB (ref 13.5–17.5)
Neutrophils Absolute: 6455
Platelets: 584 K/uL — AB (ref 150–400)
WBC: 9.9

## 2024-04-02 LAB — LIPID PANEL
Cholesterol: 154 (ref 0–200)
HDL: 41 (ref 35–70)
LDL Cholesterol: 94
Triglycerides: 97 (ref 40–160)

## 2024-04-02 LAB — CBC: RBC: 4.3 (ref 3.87–5.11)

## 2024-04-10 ENCOUNTER — Ambulatory Visit: Payer: Medicare Other | Admitting: Dermatology

## 2024-04-10 ENCOUNTER — Encounter: Payer: Self-pay | Admitting: Cardiology

## 2024-04-10 ENCOUNTER — Ambulatory Visit: Attending: Cardiology | Admitting: Cardiology

## 2024-04-10 ENCOUNTER — Encounter: Payer: Self-pay | Admitting: Dermatology

## 2024-04-10 VITALS — BP 124/73 | HR 86 | Ht 68.0 in | Wt 186.0 lb

## 2024-04-10 DIAGNOSIS — I10 Essential (primary) hypertension: Secondary | ICD-10-CM | POA: Insufficient documentation

## 2024-04-10 DIAGNOSIS — L578 Other skin changes due to chronic exposure to nonionizing radiation: Secondary | ICD-10-CM

## 2024-04-10 DIAGNOSIS — D492 Neoplasm of unspecified behavior of bone, soft tissue, and skin: Secondary | ICD-10-CM

## 2024-04-10 DIAGNOSIS — I440 Atrioventricular block, first degree: Secondary | ICD-10-CM | POA: Diagnosis present

## 2024-04-10 DIAGNOSIS — L570 Actinic keratosis: Secondary | ICD-10-CM

## 2024-04-10 DIAGNOSIS — C44311 Basal cell carcinoma of skin of nose: Secondary | ICD-10-CM | POA: Diagnosis not present

## 2024-04-10 DIAGNOSIS — C44319 Basal cell carcinoma of skin of other parts of face: Secondary | ICD-10-CM | POA: Diagnosis not present

## 2024-04-10 DIAGNOSIS — E7800 Pure hypercholesterolemia, unspecified: Secondary | ICD-10-CM | POA: Diagnosis present

## 2024-04-10 DIAGNOSIS — L820 Inflamed seborrheic keratosis: Secondary | ICD-10-CM

## 2024-04-10 DIAGNOSIS — W908XXA Exposure to other nonionizing radiation, initial encounter: Secondary | ICD-10-CM | POA: Diagnosis not present

## 2024-04-10 DIAGNOSIS — Z85828 Personal history of other malignant neoplasm of skin: Secondary | ICD-10-CM

## 2024-04-10 DIAGNOSIS — C4491 Basal cell carcinoma of skin, unspecified: Secondary | ICD-10-CM

## 2024-04-10 DIAGNOSIS — Z8589 Personal history of malignant neoplasm of other organs and systems: Secondary | ICD-10-CM

## 2024-04-10 HISTORY — DX: Basal cell carcinoma of skin, unspecified: C44.91

## 2024-04-10 NOTE — Patient Instructions (Addendum)

## 2024-04-10 NOTE — Progress Notes (Unsigned)
 Follow-Up Visit   Subjective  Philip [name] is a 83 y.o. male who presents for the following: Sores of face and head. The patient has spots, moles and lesions to be evaluated, some may be new or changing and the patient may have concern these could be cancer.  The following portions of the chart were reviewed this encounter and updated as appropriate: medications, allergies, medical history  Review of Systems:  No other skin or systemic complaints except as noted in HPI or Assessment and Plan.  Objective  Well appearing patient in no apparent distress; mood and affect are within normal limits.  A focused examination was performed of the following areas:face,scalp,arms,hands   Relevant exam findings are noted in the Assessment and Plan.  forehead x 3 and left scalp at hairline x 3 (6) Erythematous thin papules/macules with gritty scale.  right forehead x 1 Stuck-on, waxy, tan-brown papules and plaques -- Discussed benign etiology and prognosis.  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  - Recommend staying in the shade or wearing long sleeves, sun glasses (UVA+UVB protection) and wide brim hats (4-inch brim around the entire circumference of the hat). - Call for new or changing lesions.    If area treated on the left scalp is not gone in 6 to 8 weeks return to the office for a biopsy  See photo  Destruction of lesion - forehead x 3 and left scalp at hairline x 3 (6) Complexity: simple   Destruction method: cryotherapy   Informed consent: discussed and consent obtained   Timeout:   patient name, date of birth, surgical site, and procedure verified Lesion destroyed using liquid nitrogen: Yes   Region frozen until ice ball extended beyond lesion: Yes   Outcome: patient tolerated procedure well with no complications   Post-procedure details: wound care instructions given   INFLAMED SEBORRHEIC KERATOSIS right forehead x 1 Symptomatic, irritating, patient would like treated.  Destruction of lesion - right forehead x 1 Complexity: simple   Destruction method: cryotherapy   Informed consent: discussed and consent obtained   Timeout:  patient name, date of birth, surgical site, and procedure verified Lesion destroyed using liquid nitrogen: Yes   Region frozen until ice ball extended beyond lesion: Yes   Outcome: patient tolerated procedure well with no complications   Post-procedure details: wound care instructions given   NEOPLASM OF SKIN (2) right nose supra tip Epidermal / dermal shaving  Lesion diameter (cm):  1.5 Informed consent: discussed and consent obtained   Timeout: patient name, date of birth, surgical site, and procedure verified   Patient was prepped and draped in usual sterile fashion: area prepped with alcohol. Anesthesia: the lesion was anesthetized in a standard fashion   Anesthetic:  1% lidocaine  w/ epinephrine  1-100,000 local infiltration Instrument used: flexible razor blade   Hemostasis achieved with: pressure, aluminum chloride and electrodesiccation   Outcome: patient tolerated procedure well   Post-procedure details: wound care instructions given   Post-procedure details comment:  Ointment and a small bandage applied  Destruction of lesion  Destruction method: electrodesiccation and curettage   Informed consent: discussed  and consent obtained   Timeout:  patient name, date of birth, surgical site, and procedure verified Curettage performed in three different directions: Yes   Electrodesiccation performed over the curetted area: Yes    Final wound size (cm):  1.5 Hemostasis achieved with:  pressure, aluminum chloride and electrodesiccation Outcome: patient tolerated procedure well with no complications   Post-procedure details: wound care instructions given   Additional details:  Treated with EDC Specimen 1 - Surgical pathology Differential Diagnosis: R/O BCC   Check Margins: No left chin Epidermal / dermal shaving  Lesion diameter (cm):  1.2 Informed consent: discussed and consent obtained   Timeout: patient name, date of birth, surgical site, and procedure verified   Procedure prep:  Patient was prepped and draped in usual sterile fashion Prep type:  Isopropyl alcohol Anesthesia: the lesion was anesthetized in a standard fashion   Anesthetic:  1% lidocaine  w/ epinephrine  1-100,000 buffered w/ 8.4% NaHCO3 Hemostasis achieved with: pressure, aluminum chloride and electrodesiccation   Outcome: patient tolerated procedure well   Post-procedure details: sterile dressing applied and wound care instructions given   Dressing type: bandage and petrolatum    Destruction of lesion  Destruction method: electrodesiccation and curettage   Informed consent: discussed and consent obtained   Timeout:  patient name, date of birth, surgical site, and procedure verified Anesthesia: the lesion was anesthetized in a standard fashion   Anesthetic:  1% lidocaine  w/ epinephrine  1-100,000 buffered w/ 8.4% NaHCO3 Curettage performed in three different directions: Yes   Electrodesiccation performed over the curetted area: Yes   Curettage cycles:  3 Final wound size (cm):  1.2 Hemostasis achieved with:  electrodesiccation Outcome: patient tolerated procedure well with no complications   Post-procedure details: sterile dressing applied and wound care instructions given   Dressing type: petrolatum   Additional details:  Treated with EDC Specimen 2 - Surgical pathology Differential Diagnosis: R/O BCC  Check Margins: No ACTINIC SKIN  DAMAGE   HISTORY OF BASAL CELL CARCINOMA   HISTORY OF SQUAMOUS CELL CARCINOMA    HISTORY OF BASAL CELL CARCINOMA OF THE SKIN - No evidence of recurrence today - Recommend regular full body skin exams - Recommend daily broad spectrum sunscreen SPF 30+ to sun-exposed areas, reapply every 2 hours as needed.  - Call if any new or changing lesions are noted between office visits  HISTORY OF SQUAMOUS CELL CARCINOMA OF THE SKIN - No evidence of recurrence today - No lymphadenopathy - Recommend regular full body skin exams - Recommend daily broad spectrum sunscreen SPF 30+ to sun-exposed areas, reapply every 2 hours as needed.  - Call if any new or changing lesions are noted between office visits  Return in about 8 months (around 12/11/2024) for hx of skin cancer .  IClara Crisp, CMA, am acting as scribe for Celine Collard, MD .   Documentation: I have reviewed the above documentation for accuracy and completeness, and I agree with the above.  Celine Collard, MD

## 2024-04-10 NOTE — Progress Notes (Unsigned)
 Cardiology Office Note:    Date:  04/10/2024   ID:  Philip [name], DOB 1941/09/23, MRN [id number]  PCP:  Jimmy Moulding, MD   [name] Providers Cardiologist:  None     Referring MD: Philip Gehrig, MD   Chief Complaint  Patient presents with   Follow-up    6-8 week follow up. Patient states that he feels fine on today. Meds reviewed.     History of Present Illness:    Philip [name] is a 83 y.o. 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  Previous cardiac monitor showed a sinus pause, first-degree AV block.  Was previously on beta-blocker which was stopped.  Repeat cardiac monitor obtained to evaluate any significant conduction abnormalities.  He feels well, BP is adequately controlled, cholesterol also controlled.  Has no concerns at this time.  Prior notes/testing Ambulates with a walker or uses a wheelchair. Echocardiogram 10/2023 EF 60 to 65%, impaired relaxation. 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  No further episodes since stopping metoprolol .  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 chin. Nodular and infiltrative patterns.EDC.   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 Nodular. EDC   Basal cell carcinoma 08/26/2020   Right mid to inf. ear helix. Nodular, ulcerated. 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 Ulcerated.   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   L  upper eyelid, exc and ED, pt had BCC of L upper eyelid and excision also included this SCC    Past Surgical History:  Procedure Laterality Date   BACK SURGERY     CATARACT EXTRACTION     HERNIA REPAIR     PORTA CATH INSERTION N/A 01/01/2019   Procedure: PORTA CATH INSERTION;  Surgeon: Celso College, MD;  Location: [facility] INVASIVE CV LAB;  Service: Cardiovascular;  Laterality: N/A;   TONSILLECTOMY     TRANSURETHRAL RESECTION OF BLADDER TUMOR N/A 12/08/2018   Procedure: TRANSURETHRAL RESECTION OF BLADDER TUMOR (TURBT);  Surgeon: Lawerence Pressman, MD;  Location: [facility] ORS;  Service: Urology;  Laterality: N/A;    Current Medications: Current Meds  Medication Sig   aluminum-magnesium  hydroxide 200-200 MG/5ML suspension Take 10 mLs by mouth every 4 (four) hours as needed for indigestion.   aspirin  EC 81 MG tablet Take 81 mg by mouth daily. Swallow whole.   atorvastatin  (LIPITOR) 40 MG tablet Take 1 tablet (40 mg total) by mouth at bedtime.   chlorhexidine (PERIDEX) 0.12 % solution Use as directed in the mouth or throat at bedtime. 0.5 ounce by mouth at bedtime for mouth care (swish for 30 seconds and spit after evening mouth care. DO NOT RINSE.)   lisinopril  (ZESTRIL ) 5 MG tablet Take 1 tablet (5 mg total) by mouth daily.   magnesium  chloride (SLOW-MAG) 64 MG TBEC SR tablet Take 1 tablet by mouth daily.   Multiple  Vitamins-Minerals (CENTRUM MEN) TABS Take 1 tablet by mouth daily.   nystatin powder Apply to groin/affected areas topically as needed   polyethylene glycol (MIRALAX  / GLYCOLAX ) 17 g packet Take 17 g by mouth daily as needed.   Throat Lozenges (COUGH DROPS) LOZG Use as directed in the mouth or throat. 1 unit by mouth every 1 hour as needed for cough   Vitamin D , Ergocalciferol , (DRISDOL ) 1.25 MG (50000 UT) CAPS capsule Take 50,000 Units by mouth every 14 (fourteen) days.     Allergies:   Patient has no known allergies.   Social History   Socioeconomic History   Marital status:  Widowed    Spouse name: Not on file   Number of children: 4   Years of education: Not on file   Highest education level: Not on file  Occupational History   Not on file  Tobacco Use   Smoking status: Former    Current packs/day: 0.00    Types: Cigarettes    Start date: 11/29/1958    Quit date: 11/29/1993    Years since quitting: 30.3   Smokeless tobacco: Never  Vaping Use   Vaping status: Never Used  Substance and Sexual Activity   Alcohol use: Yes    Comment: a couple of drinks a day all the above   Drug use: Never   Sexual activity: Not Currently    Partners: Female  Other Topics Concern   Not on file  Social History Narrative   Not on file   Social Drivers of Health   Financial Resource Strain: Low Risk  (02/01/2024)   Received from [facility]   Overall Financial Resource Strain (CARDIA)    Difficulty of Paying Living Expenses: Not hard at all  Food Insecurity: No Food Insecurity (02/01/2024)   [facility]   Hunger Vital Sign    Worried About Running Out of Food in the Last Year: Never true    Ran Out of Food in the Last Year: Never true  Transportation Needs: No Transportation Needs (02/01/2024)   Received from [facility] - Transportation    In the past 12 months, has lack of transportation kept you from medical appointments or from getting medications?: No    Lack of Transportation (Non-Medical): No  Physical Activity: Not on file  Stress: Not on file  Social Connections: Not on file     Family History: The patient's family history includes Heart failure in his mother.  ROS:   Please see the history of present illness.     All other systems reviewed and are negative.  EKGs/Labs/Other Studies Reviewed:    The following studies were reviewed today:       Recent Labs: 11/15/2023: ALT 25 11/18/2023: BUN 28; Creatinine, Ser 1.45; Hemoglobin 14.3; Platelets 520; Potassium 4.2; Sodium  136  Recent Lipid Panel    Component Value Date/Time   CHOL 148 11/15/2023 0928   TRIG 79 11/15/2023 0928   HDL 42 11/15/2023 0928   CHOLHDL 3.5 11/15/2023 0928   VLDL 16 11/15/2023 0928   LDLCALC 90 11/15/2023 0928     Risk Assessment/Calculations:             Physical Exam:    VS:  BP 124/73   Pulse 86   Ht 5\' 8"  (1.727 m)   Wt 186 lb (84.4 kg)   SpO2 95%   BMI 28.28 kg/m     Wt Readings from  Last 3 Encounters:  04/10/24 186 lb (84.4 kg)  03/20/24 181 lb 6.4 oz (82.3 kg)  03/15/24 181 lb (82.1 kg)     GEN:  Well nourished, well developed in no acute distress HEENT: Normal NECK: No JVD; No carotid bruits CARDIAC: RRR, no murmurs, rubs, gallops RESPIRATORY:  Clear to auscultation without rales, wheezing or rhonchi  ABDOMEN: Soft, non-tender, non-distended MUSCULOSKELETAL:  No edema; No deformity  SKIN: Warm and dry NEUROLOGIC:  Alert and oriented x 3 PSYCHIATRIC:  Normal affect   ASSESSMENT:    1. First degree AV block   2. Primary hypertension   3. Pure hypercholesterolemia    PLAN:    In order of problems listed above:  History of first-degree AV block noted on ECG, sinus pauses during sleep noted on previous cardiac monitor, likely from autonomic tone.  Beta-blocker was stopped with improvement in symptoms, repeat cardiac monitor showed no sinus pauses.  Continue to avoid AV nodal agents. Hypertension, BP controlled, continue lisinopril  5 mg daily. Hyperlipidemia, recent CVA.  Continue aspirin , Lipitor 40 mg daily.  Cholesterol controlled.  Follow-up in 1 year.      Medication Adjustments/Labs and Tests Ordered: Current medicines are reviewed at length with the patient today.  Concerns regarding medicines are outlined above.  No orders of the defined types were placed in this encounter.  No orders of the defined types were placed in this encounter.   There are no Patient Instructions on file for this visit.   Signed, Constancia Delton, MD   04/10/2024 10:18 AM    [facility]

## 2024-04-10 NOTE — Patient Instructions (Signed)
 Medication Instructions:  Your Physician recommend you continue on your current medication as directed.    *If you need a refill on your cardiac medications before your next appointment, please call your pharmacy*  Lab Work: No labs ordered today  If you have labs (blood work) drawn today and your tests are completely normal, you will receive your results only by: MyChart Message (if you have MyChart) OR A paper copy in the mail If you have any lab test that is abnormal or we need to change your treatment, we will call you to review the results.  Testing/Procedures: No test ordered today   Follow-Up: At [facility], you and your health needs are our priority.  As part of our continuing mission to provide you with exceptional heart care, our providers are all part of one team.  This team includes your primary Cardiologist (physician) and Advanced Practice Providers or APPs (Physician Assistants and Nurse Practitioners) who all work together to provide you with the care you need, when you need it.  Your next appointment:   1 year(s)  Provider:   You may see Dr. Junnie Olives or one of the following Advanced Practice Providers on your designated Care Team:   Laneta Pintos, NP Gildardo Labrador, PA-C Varney Gentleman, PA-C Cadence [place], PA-C Ronald Cockayne, NP Morey Ar, NP    We recommend signing up for the patient portal called "MyChart".  Sign up information is provided on this After Visit Summary.  MyChart is used to connect with patients for Virtual Visits (Telemedicine).  Patients are able to view lab/test results, encounter notes, upcoming appointments, etc.  Non-urgent messages can be sent to your provider as well.   To learn more about what you can do with MyChart, go to [email or web address].

## 2024-04-12 ENCOUNTER — Ambulatory Visit: Payer: Self-pay | Admitting: Dermatology

## 2024-04-12 LAB — SURGICAL PATHOLOGY

## 2024-04-16 ENCOUNTER — Encounter: Payer: Self-pay | Admitting: Dermatology

## 2024-04-16 NOTE — Telephone Encounter (Addendum)
 Called and discussed results with patient's daughter. She verbalized understanding and denied further questions. Will recheck areas at next follow up. ----- Message from Celine Collard sent at 04/12/2024  7:48 PM EDT ----- FINAL DIAGNOSIS        1. Skin, right nose supra tip :       BASAL CELL CARCINOMA, NODULAR AND INFILTRATIVE PATTERNS        2. Skin, left chin :       BASAL CELL CARCINOMA, NODULAR PATTERN   1&2 - both Cancer = BCC Both already treated Recheck next visit

## 2024-05-21 ENCOUNTER — Non-Acute Institutional Stay (SKILLED_NURSING_FACILITY): Payer: Self-pay | Admitting: Student

## 2024-05-21 DIAGNOSIS — I10 Essential (primary) hypertension: Secondary | ICD-10-CM

## 2024-05-21 DIAGNOSIS — N1831 Chronic kidney disease, stage 3a: Secondary | ICD-10-CM | POA: Diagnosis not present

## 2024-05-21 DIAGNOSIS — R7303 Prediabetes: Secondary | ICD-10-CM

## 2024-05-21 DIAGNOSIS — Z936 Other artificial openings of urinary tract status: Secondary | ICD-10-CM | POA: Diagnosis not present

## 2024-05-31 NOTE — Progress Notes (Signed)
 [facility] Provider: ABDUL  Code Status: DNR Goals of Care:     03/20/2024   10:09 AM  Advanced Directives  Does Patient Have a Medical Advance Directive? Yes  Type of Estate agent of [place];Living will;Out of facility DNR (pink MOST or yellow form)  Does patient want to make changes to medical advance directive? No - Patient declined  Copy of Healthcare Power of Attorney in Chart? Yes - validated most recent copy scanned in chart (See row information)     Chief Complaint  Patient presents with   Medical Management of Chronic Issues    HPI: Patient is a 83 y.o. male seen today for medical management of chronic diseases.   Discussed the use of AI scribe software for clinical note transcription with the patient, who gave verbal consent to proceed. The patient presents for a routine visit.  He has been doing well with no current concerns. He is eating well and has regular bowel movements.  He has a urostomy which is functioning well. He changes the bag himself, with occasional assistance from a nurse. The bag typically lasts two to three weeks before needing a change.  He had a stroke in the past, which initially resulted in weakness of the left hand. All symptoms associated with the stroke, including weakness, pain, confusion, and dizziness, have resolved.  No chest pain or shortness of breath.  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 chin. Nodular and infiltrative patterns.EDC.   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 Nodular. EDC   Basal cell carcinoma 08/26/2020   Right mid to inf. ear helix. Nodular, ulcerated. 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 Ulcerated.   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   L upper eyelid, exc and ED, pt had BCC of L upper eyelid and excision also included this SCC    Past Surgical History:  Procedure Laterality Date   BACK SURGERY     CATARACT EXTRACTION     HERNIA REPAIR     PORTA CATH INSERTION N/A 01/01/2019   Procedure: PORTA CATH INSERTION;  Surgeon: [name], MD;  Location: [facility] INVASIVE CV LAB;  Service: Cardiovascular;  Laterality: N/A;   TONSILLECTOMY     TRANSURETHRAL RESECTION OF BLADDER TUMOR N/A 12/08/2018   Procedure: TRANSURETHRAL RESECTION OF BLADDER TUMOR (TURBT);  Surgeon: [name], MD;  Location: [facility] ORS;  Service: Urology;  Laterality: N/A;    No Known Allergies  Outpatient Encounter Medications as of 05/21/2024  Medication Sig   aluminum-magnesium  hydroxide 200-200 MG/5ML suspension Take 10 mLs by mouth every 4 (four) hours as needed for indigestion.  aspirin  EC 81 MG tablet Take 81 mg by mouth daily. Swallow whole.   atorvastatin  (LIPITOR) 40 MG tablet Take 1 tablet (40 mg total) by mouth at bedtime.   chlorhexidine (PERIDEX) 0.12 % solution Use as directed in the mouth or throat at bedtime. 0.5 ounce by mouth at bedtime for mouth care (swish for 30 seconds and spit after evening mouth care. DO NOT RINSE.)   lisinopril  (ZESTRIL ) 5 MG tablet Take 1 tablet (5 mg total) by mouth daily.   magnesium  chloride (SLOW-MAG) 64 MG TBEC SR tablet Take 1 tablet by mouth daily.   Multiple Vitamins-Minerals (CENTRUM MEN) TABS Take 1 tablet  by mouth daily.   nystatin powder Apply to groin/affected areas topically as needed   polyethylene glycol (MIRALAX  / GLYCOLAX ) 17 g packet Take 17 g by mouth daily as needed.   Throat Lozenges (COUGH DROPS) LOZG Use as directed in the mouth or throat. 1 unit by mouth every 1 hour as needed for cough   Vitamin D , Ergocalciferol , (DRISDOL ) 1.25 MG (50000 UT) CAPS capsule Take 50,000 Units by mouth every 14 (fourteen) days.   No facility-administered encounter medications on file as of 05/21/2024.    Review of Systems:  Review of Systems  Health Maintenance  Topic Date Due   Zoster Vaccines- Shingrix (1 of 2) Never done   COVID-19 Vaccine (7 - Moderna risk 2024-25 season) 02/23/2024   INFLUENZA VACCINE  06/29/2024   Medicare Annual Wellness (AWV)  03/15/2025   DTaP/Tdap/Td (2 - Td or Tdap) 12/06/2027   Pneumococcal Vaccine: 50+ Years  Completed   Hepatitis B Vaccines  Aged Out   HPV VACCINES  Aged Out   Meningococcal B Vaccine  Aged Out    Physical Exam: Vitals:   05/21/24 1054  BP: 108/67  Resp: 14  Temp: 97.6 F (36.4 C)  SpO2: 95%  Weight: 181 lb (82.1 kg)   Body mass index is 27.52 kg/m. Physical Exam Constitutional:      Appearance: Normal appearance.  Cardiovascular:     Rate and Rhythm: Normal rate and regular rhythm.     Pulses: Normal pulses.     Heart sounds: Normal heart sounds.  Pulmonary:     Effort: Pulmonary effort is normal.  Abdominal:     General: Abdomen is flat. Bowel sounds are normal.     Palpations: Abdomen is soft.  Musculoskeletal:        General: No swelling or tenderness.  Skin:    General: Skin is warm and dry.     Capillary Refill: Capillary refill takes 2 to 3 seconds.  Neurological:     Mental Status: He is alert and oriented to person, place, and time.  Psychiatric:        Mood and Affect: Mood normal.    Physical Exam   Labs reviewed: Basic Metabolic Panel: Recent Labs    11/16/23 0552 11/17/23 0603 11/18/23 0559  NA  133* 133* 136  K 4.0 3.9 4.2  CL 102 100 101  CO2 24 24 26   GLUCOSE 88 84 93  BUN 22 25* 28*  CREATININE 1.29* 1.53* 1.45*  CALCIUM  9.1 9.1 9.1   Liver Function Tests: Recent Labs    09/29/23 0000 11/15/23 0928  AST 17 24  ALT 19 25  ALKPHOS 95 112  BILITOT  --  0.8  PROT  --  7.5  ALBUMIN  3.6 3.5   No results for input(s): LIPASE, AMYLASE in the last 8760 hours. No results for input(s):  AMMONIA in the last 8760 hours. CBC: Recent Labs    11/15/23 0928 11/16/23 0552 11/17/23 0603 11/18/23 0559  WBC 9.6 10.3 10.4 10.5  NEUTROABS 6.8 6.9  --   --   HGB 14.9 13.3 13.5 14.3  HCT 46.2 40.7 40.4 42.7  MCV 93.0 90.8 89.2 89.9  PLT 482* 488* 489* 520*   Lipid Panel: Recent Labs    11/15/23 0928  CHOL 148  HDL 42  LDLCALC 90  TRIG 79  CHOLHDL 3.5   Lab Results  Component Value Date   HGBA1C 5.6 11/15/2023    Procedures since last visit: No results found. Results   Assessment/Plan History of stroke Complete resolution of symptoms following initial presentation with left hand weakness. No current symptoms such as pain, confusion, dizziness, chest pain, or shortness of breath. Improved weakness over the last 6 months. Continue ASA and atorvastatin .   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 Bag changed with assistance from nurses every two to three weeks.  Hypertension Well-controlled at this time with lisinopril .   HLD Continue Atorvastatin .   Labs/tests ordered:  * No order type specified * Next appt:  Visit date not found

## 2024-06-01 ENCOUNTER — Encounter: Payer: Self-pay | Admitting: Student

## 2024-07-03 ENCOUNTER — Encounter: Payer: Self-pay | Admitting: Nurse Practitioner

## 2024-07-03 ENCOUNTER — Non-Acute Institutional Stay (SKILLED_NURSING_FACILITY): Payer: Self-pay | Admitting: Nurse Practitioner

## 2024-07-03 DIAGNOSIS — R7303 Prediabetes: Secondary | ICD-10-CM

## 2024-07-03 DIAGNOSIS — E7800 Pure hypercholesterolemia, unspecified: Secondary | ICD-10-CM

## 2024-07-03 DIAGNOSIS — N1831 Chronic kidney disease, stage 3a: Secondary | ICD-10-CM | POA: Diagnosis not present

## 2024-07-03 DIAGNOSIS — I69359 Hemiplegia and hemiparesis following cerebral infarction affecting unspecified side: Secondary | ICD-10-CM | POA: Diagnosis not present

## 2024-07-03 DIAGNOSIS — E559 Vitamin D deficiency, unspecified: Secondary | ICD-10-CM

## 2024-07-03 DIAGNOSIS — I10 Essential (primary) hypertension: Secondary | ICD-10-CM

## 2024-07-03 DIAGNOSIS — E782 Mixed hyperlipidemia: Secondary | ICD-10-CM

## 2024-07-03 NOTE — Progress Notes (Unsigned)
 Location:  Other [place].  Nursing Home Room Number: [facility]A Place of Service:  SNF [phone]) Philip An, NP  PCP: Abdul Fine, MD  Patient Care Team: Abdul Fine, MD as PCP - General (Family Medicine) [name], MD as Consulting Physician (Urology) [name], MD as Consulting Physician (Oncology) [name], MD as Referring Physician (Vascular Surgery)  Extended Emergency Contact Information Primary Emergency Contact: warner,[name] Mobile Phone: [phone] Relation: Daughter Interpreter needed? No Secondary Emergency Contact: [name],[name] Home Phone: [phone] Mobile Phone: [phone] Relation: Daughter  Goals of care: Advanced Directive information    07/03/2024   12:05 PM  Advanced Directives  Does Patient Have a Medical Advance Directive? Yes  Type of Estate agent of [place];Living will  Does patient want to make changes to medical advance directive? No - Patient declined  Copy of Healthcare Power of Attorney in Chart? Yes - validated most recent copy scanned in chart (See row information)     Chief Complaint  Patient presents with   Medical Management of Chronic Issues    Medical Management of Chronic Issues    HPI:  Pt is a 83 y.o. male seen today for medical management of chronic disease.    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 chin. Nodular and infiltrative patterns.EDC.   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 Nodular. EDC   Basal cell carcinoma 08/26/2020   Right mid to inf. ear helix. Nodular, ulcerated. 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 Ulcerated.   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   L upper eyelid, exc and ED, pt had BCC of L upper eyelid and excision also included this SCC   Past Surgical History:  Procedure Laterality Date   BACK SURGERY     CATARACT EXTRACTION     HERNIA REPAIR     PORTA CATH INSERTION N/A 01/01/2019   Procedure: PORTA CATH INSERTION;  Surgeon: [name], MD;  Location: [facility] INVASIVE CV LAB;  Service: Cardiovascular;  Laterality: N/A;   TONSILLECTOMY     TRANSURETHRAL RESECTION OF BLADDER TUMOR N/A 12/08/2018   Procedure: TRANSURETHRAL RESECTION OF BLADDER TUMOR (TURBT);  Surgeon: [name], MD;  Location: [facility] ORS;  Service: Urology;  Laterality: N/A;    No Known Allergies  Outpatient Encounter Medications as of 07/03/2024  Medication Sig   aluminum-magnesium  hydroxide 200-200 MG/5ML suspension Take 10 mLs by mouth every 4 (four) hours as needed for indigestion.   aspirin  EC 81 MG tablet Take 81 mg by mouth daily. Swallow whole.   atorvastatin  (LIPITOR) 40 MG tablet Take 1 tablet (40 mg total) by mouth at bedtime.   chlorhexidine (  PERIDEX) 0.12 % solution Use as directed in the mouth or throat at bedtime. 0.5 ounce by mouth at bedtime for mouth care (swish for 30 seconds and spit after evening mouth care. DO NOT RINSE.)   lisinopril  (ZESTRIL ) 5 MG tablet Take 1 tablet (5 mg total) by mouth daily.   magnesium  chloride (SLOW-MAG) 64 MG TBEC SR tablet Take 1 tablet by mouth daily.   Multiple Vitamins-Minerals (CENTRUM MEN) TABS Take 1 tablet by mouth daily.   nystatin powder Apply to groin/affected areas  topically as needed   polyethylene glycol (MIRALAX  / GLYCOLAX ) 17 g packet Take 17 g by mouth daily as needed.   Throat Lozenges (COUGH DROPS) LOZG Use as directed in the mouth or throat. 1 unit by mouth every 1 hour as needed for cough   Vitamin D , Ergocalciferol , (DRISDOL ) 1.25 MG (50000 UT) CAPS capsule Take 50,000 Units by mouth every 14 (fourteen) days.   No facility-administered encounter medications on file as of 07/03/2024.    Review of Systems ***  Immunization History  Administered Date(s) Administered   Influenza Inj Mdck Quad Pf 08/25/2021, 08/31/2022   Influenza-Unspecified 08/27/2014, 09/03/2015, 08/31/2016, 08/29/2017, 09/21/2023   Moderna Covid-19 Fall Seasonal Vaccine 76yrs & older 03/08/2023   Moderna Sars-Covid-2 Vaccination 12/11/2019, 01/08/2020, 10/08/2020, 08/20/2021   Pneumococcal Conjugate-13 03/21/2014, 06/02/2017   Pneumococcal Polysaccharide-23 04/08/2016   Tdap 12/05/2017   Unspecified SARS-COV-2 Vaccination 08/26/2023, 03/09/2024   Pertinent  Health Maintenance Due  Topic Date Due   INFLUENZA VACCINE  06/29/2024      11/24/2022   10:00 PM 11/25/2022    8:30 PM 11/26/2022    9:00 AM 04/24/2023    9:06 PM 03/15/2024    3:16 PM  Fall Risk  Falls in the past year?    1 1  Was there [name] injury with Fall?    1 0  Fall Risk Category Calculator    3 1  (RETIRED) Patient Fall Risk Level High fall risk  High fall risk  High fall risk     Patient at Risk for Falls Due to    History of fall(s);Impaired balance/gait;Impaired mobility History of fall(s);Impaired mobility  Fall risk Follow up    Falls prevention discussed Falls evaluation completed;Education provided     Data saved with a previous flowsheet row definition   Functional Status Survey:    Vitals:   07/03/24 1153 07/03/24 1206  BP: (!) 148/80 120/72  Pulse: 76   Resp: 18   Temp: (!) 97.5 F (36.4 C)   SpO2: 94%   Weight: 182 lb 6.4 oz (82.7 kg)   Height: 5' 8 (1.727 m)    Body mass index  is 27.73 kg/m. Physical Exam***  Labs reviewed: Recent Labs    11/16/23 0552 11/17/23 0603 11/18/23 0559  NA 133* 133* 136  K 4.0 3.9 4.2  CL 102 100 101  CO2 24 24 26   GLUCOSE 88 84 93  BUN 22 25* 28*  CREATININE 1.29* 1.53* 1.45*  CALCIUM  9.1 9.1 9.1   Recent Labs    09/29/23 0000 11/15/23 0928  AST 17 24  ALT 19 25  ALKPHOS 95 112  BILITOT  --  0.8  PROT  --  7.5  ALBUMIN  3.6 3.5   Recent Labs    11/15/23 0928 11/16/23 0552 11/17/23 0603 11/18/23 0559 04/02/24 0000  WBC 9.6 10.3 10.4 10.5 9.9  NEUTROABS 6.8 6.9  --   --  6,455.00  HGB 14.9 13.3 13.5 14.3 13.1*  HCT  46.2 40.7 40.4 42.7 41  MCV 93.0 90.8 89.2 89.9  --   PLT 482* 488* 489* 520* 584*   No results found for: TSH Lab Results  Component Value Date   HGBA1C 5.6 11/15/2023   Lab Results  Component Value Date   CHOL 154 04/02/2024   HDL 41 04/02/2024   LDLCALC 94 04/02/2024   TRIG 97 04/02/2024   CHOLHDL 3.5 11/15/2023    Significant Diagnostic Results in last 30 days:  No results found.  Assessment/Plan No problem-specific Assessment & Plan notes found for this encounter.     Jessica K. Caro BODILY [facility] [phone]

## 2024-07-04 NOTE — Assessment & Plan Note (Signed)
 Continue dietary modifications. A1c at goal in December, continue to monitor.

## 2024-07-04 NOTE — Assessment & Plan Note (Signed)
 Continues on Vit D 50,000 units twice monthly

## 2024-07-04 NOTE — Assessment & Plan Note (Signed)
 Blood pressure well controlled, goal bp <140/90 Continue current medications and dietary modifications follow metabolic panel

## 2024-07-04 NOTE — Assessment & Plan Note (Signed)
 Continues on lipitor with dietary modifications  Monitor lipids routinely

## 2024-07-04 NOTE — Assessment & Plan Note (Signed)
 Chronic and stable Encourage proper hydration Follow metabolic panel Avoid nephrotoxic meds (NSAIDS)

## 2024-07-04 NOTE — Assessment & Plan Note (Signed)
 Stable at this time, significant improvement in weakness, doing very well with mobility and ADLS Continues on statin and ASA

## 2024-07-23 ENCOUNTER — Non-Acute Institutional Stay (SKILLED_NURSING_FACILITY): Payer: Self-pay | Admitting: Student

## 2024-07-23 DIAGNOSIS — M25519 Pain in unspecified shoulder: Secondary | ICD-10-CM | POA: Diagnosis not present

## 2024-07-28 ENCOUNTER — Encounter: Payer: Self-pay | Admitting: Student

## 2024-07-28 NOTE — Progress Notes (Signed)
 Location:  Other Nursing Home Room Number: [facility] [address]A Place of Service:  SNF [phone]) Provider:  Abdul Abdul, Richerd, MD  Patient Care Team: Abdul Richerd, MD as PCP - General (Family Medicine) [name], MD as Consulting Physician (Urology) [name], MD as Consulting Physician (Oncology) [name], MD as Referring Physician (Vascular Surgery)  Extended Emergency Contact Information Primary Emergency Contact: warner,[name] Mobile Phone: [phone] Relation: Daughter Interpreter needed? No Secondary Emergency Contact: [name],[name] Home Phone: [phone] Mobile Phone: [phone] Relation: Daughter  Code Status:  DNR Goals of care: Advanced Directive information    07/03/2024   12:05 PM  Advanced Directives  Does Patient Have a Medical Advance Directive? Yes  Type of Estate agent of [place];Living will  Does patient want to make changes to medical advance directive? No - Patient declined  Copy of Healthcare Power of Attorney in Chart? Yes - validated most recent copy scanned in chart (See row information)     Chief Complaint  Patient presents with   Shoulder Pain    HPI:  Pt is a 83 y.o. male seen today for an acute visit  Discussed the use of AI scribe software for clinical note transcription with the patient, who gave verbal consent to proceed.  History of Present Illness  History of Present Illness The patient presents with right shoulder pain.  He has been experiencing pain in his right shoulder since yesterday. The pain is localized to the shoulder and does not radiate. He is unsure of the cause but speculates it might be due to sleeping on it wrong. No associated chest pain.  He has been taking Tylenol  for the pain, which provides relief. He took a dose yesterday and another dose over half an hour ago. He reports that it is painful to reach for objects, such as a cup.  No chest  pain.   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 chin. Nodular and infiltrative patterns.EDC.   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 Nodular. EDC   Basal cell carcinoma 08/26/2020   Right mid to inf. ear helix. Nodular, ulcerated. 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 Ulcerated.   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   L upper eyelid, exc and ED, pt had BCC of L upper eyelid and excision also included this SCC   Past Surgical History:  Procedure Laterality Date   BACK SURGERY     CATARACT EXTRACTION     HERNIA REPAIR     PORTA CATH INSERTION N/A 01/01/2019   Procedure: PORTA CATH INSERTION;  Surgeon: [name], MD;  Location: [facility] INVASIVE CV LAB;  Service: Cardiovascular;  Laterality:  N/A;   TONSILLECTOMY     TRANSURETHRAL RESECTION OF BLADDER TUMOR N/A 12/08/2018   Procedure: TRANSURETHRAL RESECTION OF BLADDER TUMOR (TURBT);  Surgeon: [name], MD;  Location: [facility] ORS;  Service: Urology;  Laterality: N/A;    No Known Allergies  Outpatient Encounter Medications as of 07/23/2024  Medication Sig   aluminum-magnesium  hydroxide 200-200 MG/5ML suspension Take 10 mLs by mouth every 4 (four) hours as needed for indigestion.   aspirin  EC 81 MG tablet Take 81 mg by mouth  daily. Swallow whole.   atorvastatin  (LIPITOR) 40 MG tablet Take 1 tablet (40 mg total) by mouth at bedtime.   chlorhexidine (PERIDEX) 0.12 % solution Use as directed in the mouth or throat at bedtime. 0.5 ounce by mouth at bedtime for mouth care (swish for 30 seconds and spit after evening mouth care. DO NOT RINSE.)   lisinopril  (ZESTRIL ) 5 MG tablet Take 1 tablet (5 mg total) by mouth daily.   magnesium  chloride (SLOW-MAG) 64 MG TBEC SR tablet Take 1 tablet by mouth daily.   Multiple Vitamins-Minerals (CENTRUM MEN) TABS Take 1 tablet by mouth daily.   nystatin powder Apply to groin/affected areas topically as needed   polyethylene glycol (MIRALAX  / GLYCOLAX ) 17 g packet Take 17 g by mouth daily as needed.   Throat Lozenges (COUGH DROPS) LOZG Use as directed in the mouth or throat. 1 unit by mouth every 1 hour as needed for cough   Vitamin D , Ergocalciferol , (DRISDOL ) 1.25 MG (50000 UT) CAPS capsule Take 50,000 Units by mouth every 14 (fourteen) days.   No facility-administered encounter medications on file as of 07/23/2024.    Review of Systems  Immunization History  Administered Date(s) Administered   Influenza Inj Mdck Quad Pf 08/25/2021, 08/31/2022   Influenza-Unspecified 08/27/2014, 09/03/2015, 08/31/2016, 08/29/2017, 09/21/2023   Moderna Covid-19 Fall Seasonal Vaccine 70yrs & older 03/08/2023   Moderna Sars-Covid-2 Vaccination 12/11/2019, 01/08/2020, 10/08/2020, 08/20/2021   Pneumococcal Conjugate-13 03/21/2014, 06/02/2017   Pneumococcal Polysaccharide-23 04/08/2016   Tdap 12/05/2017   Unspecified SARS-COV-2 Vaccination 08/26/2023, 03/09/2024   Pertinent  Health Maintenance Due  Topic Date Due   INFLUENZA VACCINE  06/29/2024      11/24/2022   10:00 PM 11/25/2022    8:30 PM 11/26/2022    9:00 AM 04/24/2023    9:06 PM 03/15/2024    3:16 PM  Fall Risk  Falls in the past year?    1 1  Was there an injury with Fall?    1 0  Fall Risk Category Calculator    3 1  (RETIRED)  Patient Fall Risk Level High fall risk  High fall risk  High fall risk     Patient at Risk for Falls Due to    History of fall(s);Impaired balance/gait;Impaired mobility History of fall(s);Impaired mobility  Fall risk Follow up    Falls prevention discussed Falls evaluation completed;Education provided     Data saved with a previous flowsheet row definition   Functional Status Survey:    Vitals:   07/23/24 2122  BP: 124/71  Pulse: 79  Resp: 18  Temp: 98.1 F (36.7 C)  SpO2: 97%  Weight: 182 lb 6.4 oz (82.7 kg)   Body mass index is 27.73 kg/m. Physical Exam Cardiovascular:     Rate and Rhythm: Normal rate and regular rhythm.  Pulmonary:     Effort: Pulmonary effort is normal.  Musculoskeletal:     Comments: Tenderness in right shoulder   Neurological:     Mental Status:  He is alert and oriented to person, place, and time.     Labs reviewed: Recent Labs    11/16/23 0552 11/17/23 0603 11/18/23 0559  NA 133* 133* 136  K 4.0 3.9 4.2  CL 102 100 101  CO2 24 24 26   GLUCOSE 88 84 93  BUN 22 25* 28*  CREATININE 1.29* 1.53* 1.45*  CALCIUM  9.1 9.1 9.1   Recent Labs    09/29/23 0000 11/15/23 0928  AST 17 24  ALT 19 25  ALKPHOS 95 112  BILITOT  --  0.8  PROT  --  7.5  ALBUMIN  3.6 3.5   Recent Labs    11/15/23 0928 11/16/23 0552 11/17/23 0603 11/18/23 0559 04/02/24 0000  WBC 9.6 10.3 10.4 10.5 9.9  NEUTROABS 6.8 6.9  --   --  6,455.00  HGB 14.9 13.3 13.5 14.3 13.1*  HCT 46.2 40.7 40.4 42.7 41  MCV 93.0 90.8 89.2 89.9  --   PLT 482* 488* 489* 520* 584*   No results found for: TSH Lab Results  Component Value Date   HGBA1C 5.6 11/15/2023   Lab Results  Component Value Date   CHOL 154 04/02/2024   HDL 41 04/02/2024   LDLCALC 94 04/02/2024   TRIG 97 04/02/2024   CHOLHDL 3.5 11/15/2023    Significant Diagnostic Results in last 30 days:  No results found.  Assessment/Plan Right shoulder pain Acute right shoulder pain since yesterday,  localized to the shoulder and exacerbated by movement. No associated chest pain. Tylenol  provides relief. No specific event identified as the cause, possibly due to sleeping position. Physical examination shows intact strength with localized tenderness. - Continue Tylenol  for pain management. - Offer lidocaine  patch if needed. - Consider physical therapy for shoulder exercises if pain persists beyond a week.  Family/ staff Communication: nursing  Labs/tests ordered:  none

## 2024-09-25 ENCOUNTER — Non-Acute Institutional Stay (SKILLED_NURSING_FACILITY): Payer: Self-pay | Admitting: Nurse Practitioner

## 2024-09-25 ENCOUNTER — Encounter: Payer: Self-pay | Admitting: Nurse Practitioner

## 2024-09-25 DIAGNOSIS — N1831 Chronic kidney disease, stage 3a: Secondary | ICD-10-CM | POA: Diagnosis not present

## 2024-09-25 DIAGNOSIS — E782 Mixed hyperlipidemia: Secondary | ICD-10-CM | POA: Diagnosis not present

## 2024-09-25 DIAGNOSIS — I10 Essential (primary) hypertension: Secondary | ICD-10-CM | POA: Diagnosis not present

## 2024-09-25 DIAGNOSIS — Z8781 Personal history of (healed) traumatic fracture: Secondary | ICD-10-CM

## 2024-09-25 DIAGNOSIS — R7303 Prediabetes: Secondary | ICD-10-CM

## 2024-09-25 DIAGNOSIS — E559 Vitamin D deficiency, unspecified: Secondary | ICD-10-CM

## 2024-09-25 NOTE — Progress Notes (Signed)
 Location:  [place].  Nursing Home Room Number: [facility] Place of Service:  SNF [phone]) Harlene An, NP  PCP: [name], DO  Patient Care Team: [name], DO as PCP - General (Internal Medicine) [name], MD as Consulting Physician (Urology) [name], MD as Consulting Physician (Oncology) [name], MD as Referring Physician (Vascular Surgery)  Extended Emergency Contact Information Primary Emergency Contact: warner,[name] Mobile Phone: [phone] Relation: Daughter Interpreter needed? No Secondary Emergency Contact: [name],[name] Home Phone: [phone] Mobile Phone: [phone] Relation: Daughter  Goals of care: Advanced Directive information    07/03/2024   12:05 PM  Advanced Directives  Does Patient Have a Medical Advance Directive? Yes  Type of Estate Agent of [place];Living will  Does patient want to make changes to medical advance directive? No - Patient declined  Copy of Healthcare Power of Attorney in Chart? Yes - validated most recent copy scanned in chart (See row information)   Chief Complaint  Patient presents with   Medical Management of Chronic Issues    Medical Management of Chronic Issues.    HPI:  The patient is an 83 year old male presenting for routine medical management of chronic conditions. His medical history is significant for chronic kidney disease, cerebrovascular accident, hypertension, hyperlipidemia, and bladder cancer status post urostomy.  He reports no new pain and states he is sleeping and eating well. He drinks sweet tea regularly and approximately one cup of water daily. He is compliant with his medications and denies any issues with swallowing. Bowel movements are regular, and he is urinating without difficulty. He independently manages his urostomy bag, with nursing staff assisting periodically with bag changes. He denies headaches or changes in  vision.  Socially, he maintains regular contact with his eldest daughter who resides in [place]. He is originally from [place]  and has built a home in [place]. He previously lived at [facility] community with his wife before transitioning to long-term care approximately 2.5 years ago.   He uses a rollator walker for mobility and reports no significant limitations. Although he states his vision is adequate, he has not seen an eye care provider recently.  During the visit, we discussed his career and extensive travel history with his wife. He remains in good spirits and denies any current health concerns. Overall, his condition appears stable with no significant changes reported.  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 chin. Nodular and infiltrative patterns.EDC.   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 Nodular. EDC   Basal cell carcinoma 08/26/2020   Right mid to inf. ear helix. Nodular, ulcerated. 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 Ulcerated.   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   L upper eyelid, exc and ED, pt had BCC of L upper eyelid and excision also included this SCC   Past Surgical History:  Procedure Laterality Date   BACK SURGERY     CATARACT EXTRACTION      HERNIA REPAIR     PORTA CATH INSERTION N/A 01/01/2019   Procedure: PORTA CATH INSERTION;  Surgeon: [name], MD;  Location: [facility] INVASIVE CV LAB;  Service: Cardiovascular;  Laterality: N/A;   TONSILLECTOMY     TRANSURETHRAL RESECTION OF BLADDER TUMOR N/A 12/08/2018   Procedure: TRANSURETHRAL RESECTION OF BLADDER TUMOR (TURBT);  Surgeon: [name], MD;  Location: [facility] ORS;  Service: Urology;  Laterality: N/A;   No Known Allergies  Outpatient Encounter Medications as of 09/25/2024  Medication Sig   acetaminophen  (TYLENOL ) 500 MG tablet Take 1,000 mg by mouth every 8 (eight) hours as needed.   aluminum-magnesium  hydroxide 200-200 MG/5ML suspension Take 10 mLs by mouth every 4 (four) hours as needed for indigestion.   aspirin  EC 81 MG tablet Take 81 mg by mouth daily. Swallow whole.   atorvastatin  (LIPITOR) 40 MG tablet Take 1 tablet (40 mg total) by mouth at bedtime.   chlorhexidine (PERIDEX) 0.12 % solution Use as directed in the mouth or throat at bedtime. 0.5 ounce by mouth at bedtime for mouth care (swish for 30 seconds and spit after evening mouth care. DO NOT RINSE.)   lisinopril  (ZESTRIL ) 5 MG tablet Take 1 tablet (5 mg total) by mouth daily.   magnesium  chloride (SLOW-MAG) 64 MG TBEC SR tablet Take 1 tablet by mouth daily.   Multiple Vitamins-Minerals (CENTRUM MEN) TABS Take 1 tablet by mouth daily.   nystatin powder Apply to groin/affected areas topically as needed   polyethylene glycol (MIRALAX  / GLYCOLAX ) 17 g packet Take 17 g by mouth daily as needed.   Sodium Fluoride (PREVIDENT 5000 BOOSTER PLUS) 1.1 % PSTE Place 1 Application onto teeth at bedtime.   Throat Lozenges (COUGH DROPS) LOZG Use as directed in the mouth or throat. 1 unit by mouth every 1 hour as needed for cough   Vitamin D , Ergocalciferol , (DRISDOL ) 1.25 MG (50000 UT) CAPS capsule Take 50,000 Units by mouth every 14 (fourteen) days.   No facility-administered encounter medications on file as of 09/25/2024.    Review of Systems  Constitutional: Negative.   HENT: Negative.    Eyes: Negative.   Respiratory: Negative.    Cardiovascular: Negative.   Gastrointestinal: Negative.   Genitourinary: Negative.   Musculoskeletal: Negative.   Skin: Negative.   Allergic/Immunologic: Negative.   Neurological: Negative.   Psychiatric/Behavioral: Negative.      Immunization History  Administered Date(s) Administered   Influenza Inj Mdck Quad Pf 08/25/2021, 08/31/2022   Influenza-Unspecified 08/27/2014, 09/03/2015, 08/31/2016, 08/29/2017, 09/21/2023, 09/11/2024   Moderna Covid-19 Fall Seasonal Vaccine 66yrs & older 03/08/2023   Moderna Sars-Covid-2 Vaccination 12/11/2019, 01/08/2020, 10/08/2020, 08/20/2021   Pneumococcal Conjugate-13 03/21/2014, 06/02/2017   Pneumococcal Polysaccharide-23 04/08/2016   Tdap 12/05/2017   Unspecified SARS-COV-2 Vaccination 08/26/2023, 03/09/2024   Pertinent  Health Maintenance Due  Topic Date Due   Influenza Vaccine  Completed      11/24/2022   10:00 PM 11/25/2022    8:30 PM 11/26/2022    9:00 AM 04/24/2023    9:06 PM 03/15/2024    3:16 PM  Fall Risk  Falls in the past year?    1 1  Was there an injury with Fall?    1  0  Fall Risk Category Calculator    3 1  (RETIRED) Patient Fall Risk Level High fall risk  High fall risk  High fall risk     Patient at Risk for Falls Due to    History of fall(s);Impaired balance/gait;Impaired mobility History of fall(s);Impaired mobility  Fall risk Follow up    Falls prevention discussed Falls evaluation completed;Education provided     Data saved with a previous flowsheet row definition   Functional Status Survey:   Vitals:   09/25/24 1037 09/25/24 1043  BP: (!) 162/76 126/78  Pulse: 80   Resp: 20   Temp: 97.7 F (36.5 C)   SpO2: 96%   Weight: 180 lb 11.2 oz (82 kg)   Height: 5' 8 (1.727 m)    Body mass index is 27.48 kg/m. Physical Exam Vitals reviewed.  Constitutional:      Appearance: Normal appearance.   HENT:     Head: Normocephalic and atraumatic.     Right Ear: External ear normal.     Left Ear: External ear normal.     Nose: Nose normal.     Mouth/Throat:     Mouth: Mucous membranes are moist.     Pharynx: Oropharynx is clear.  Eyes:     Conjunctiva/sclera: Conjunctivae normal.  Cardiovascular:     Rate and Rhythm: Normal rate and regular rhythm.     Pulses: Normal pulses.     Heart sounds: Normal heart sounds.  Pulmonary:     Effort: Pulmonary effort is normal.     Breath sounds: Normal breath sounds.  Abdominal:     General: Bowel sounds are normal.     Palpations: Abdomen is soft.  Musculoskeletal:     Right lower leg: 1+ Pitting Edema present.     Left lower leg: 1+ Pitting Edema present.  Skin:    General: Skin is warm and dry.  Neurological:     General: No focal deficit present.     Mental Status: He is alert and oriented to person, place, and time. Mental status is at baseline.  Psychiatric:        Mood and Affect: Mood normal.        Behavior: Behavior normal.   Labs reviewed: Recent Labs    11/16/23 0552 11/17/23 0603 11/18/23 0559  NA 133* 133* 136  K 4.0 3.9 4.2  CL 102 100 101  CO2 24 24 26   GLUCOSE 88 84 93  BUN 22 25* 28*  CREATININE 1.29* 1.53* 1.45*  CALCIUM  9.1 9.1 9.1   Recent Labs    09/29/23 0000 11/15/23 0928  AST 17 24  ALT 19 25  ALKPHOS 95 112  BILITOT  --  0.8  PROT  --  7.5  ALBUMIN  3.6 3.5   Recent Labs    11/15/23 0928 11/16/23 0552 11/17/23 0603 11/18/23 0559 04/02/24 0000  WBC 9.6 10.3 10.4 10.5 9.9  NEUTROABS 6.8 6.9  --   --  6,455.00  HGB 14.9 13.3 13.5 14.3 13.1*  HCT 46.2 40.7 40.4 42.7 41  MCV 93.0 90.8 89.2 89.9  --   PLT 482* 488* 489* 520* 584*   No results found for: TSH Lab Results  Component Value Date   HGBA1C 5.6 11/15/2023   Lab Results  Component Value Date   CHOL 154 04/02/2024   HDL 41 04/02/2024   LDLCALC 94 04/02/2024   TRIG 97 04/02/2024   CHOLHDL 3.5 11/15/2023    Significant Diagnostic Results in last 30  days:  No results found.  Assessment/Plan  1. Essential hypertension (Primary) - Blood pressure remains well controlled; target BP <140/90 mmHg. - Continue lisinopril  as prescribed. - Maintain dietary modifications to support blood - pressure control. - Monitor renal function and electrolytes via metabolic panel every 6 months.  2. Prediabetes - Condition is stable; most recent HbA1c within target range. - Continue current dietary modifications and lifestyle measures.  3. Mixed hyperlipidemia - Chronic and stable on current regimen. - Continue atorvastatin  as prescribed. - Continue annual lipid panel monitoring.  4. Chronic kidney disease, stage 3a (HCC) - Stable. - Continue monitoring renal function with metabolic panel every 6 months.  5. Vitamin D  deficiency - Chronic condition. - Continue vitamin D  supplementation as prescribed.  6. Hx of compression fracture of spine - Stable. - Patient ambulates with a rollator walker; no acute concerns or recent falls reported. - Continue acetaminophen  as needed for pain management.   Irfat Avie, AGPCNP Student - [facility] -I personally was present during the history, physical exam and medical decision-making activities of this service and have verified that the service and findings are accurately documented in the student's note Americus Scheurich K. Caro BODILY [facility] [phone]

## 2024-09-25 NOTE — Progress Notes (Deleted)
 Location:  [place].  Nursing Home Room Number: [facility] Place of Service:  SNF [phone]) Harlene An, NP  PCP: [name], DO  Patient Care Team: [name], DO as PCP - General (Internal Medicine) [name], MD as Consulting Physician (Urology) [name], MD as Consulting Physician (Oncology) [name], MD as Referring Physician (Vascular Surgery)  Extended Emergency Contact Information Primary Emergency Contact: warner,[name] Mobile Phone: [phone] Relation: Daughter Interpreter needed? No Secondary Emergency Contact: [name],[name] Home Phone: [phone] Mobile Phone: [phone] Relation: Daughter  Goals of care: Advanced Directive information    07/03/2024   12:05 PM  Advanced Directives  Does Patient Have a Medical Advance Directive? Yes  Type of Estate Agent of [place];Living will  Does patient want to make changes to medical advance directive? No - Patient declined  Copy of Healthcare Power of Attorney in Chart? Yes - validated most recent copy scanned in chart (See row information)     Chief Complaint  Patient presents with   Medical Management of Chronic Issues    Medical Management of Chronic Issues.     HPI:  Pt is a 83 y.o. male seen today for medical management of chronic disease.    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 chin. Nodular and infiltrative patterns.EDC.   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 Nodular. EDC   Basal cell carcinoma 08/26/2020   Right mid to inf. ear helix. Nodular, ulcerated. 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 Ulcerated.   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   L upper eyelid, exc and ED, pt had BCC of L upper eyelid and excision also included this SCC   Past Surgical History:  Procedure Laterality Date   BACK SURGERY     CATARACT EXTRACTION     HERNIA REPAIR     PORTA CATH INSERTION N/A 01/01/2019   Procedure: PORTA CATH INSERTION;  Surgeon: [name], MD;  Location: [facility] INVASIVE CV LAB;  Service: Cardiovascular;  Laterality: N/A;   TONSILLECTOMY     TRANSURETHRAL RESECTION OF BLADDER TUMOR N/A 12/08/2018   Procedure: TRANSURETHRAL RESECTION OF BLADDER TUMOR (TURBT);  Surgeon: [name], MD;  Location: [facility] ORS;  Service: Urology;  Laterality: N/A;    No Known Allergies  Outpatient Encounter Medications as of 09/25/2024  Medication Sig   acetaminophen  (TYLENOL ) 500 MG tablet Take 1,000 mg by mouth every 8 (eight) hours as needed.   aluminum-magnesium  hydroxide 200-200 MG/5ML suspension Take 10 mLs by mouth every 4 (four) hours as needed for indigestion.   aspirin  EC 81 MG tablet Take 81 mg by mouth daily. Swallow whole.  atorvastatin  (LIPITOR) 40 MG tablet Take 1 tablet (40 mg total) by mouth at bedtime.   chlorhexidine (PERIDEX) 0.12 % solution Use as directed in the mouth or throat at bedtime. 0.5 ounce by mouth at bedtime for mouth care (swish for 30 seconds and spit after evening mouth care. DO NOT RINSE.)   lisinopril  (ZESTRIL ) 5 MG tablet Take 1 tablet (5 mg total) by mouth daily.   magnesium  chloride (SLOW-MAG) 64 MG TBEC SR tablet Take 1 tablet by mouth daily.   Multiple Vitamins-Minerals (CENTRUM MEN)  TABS Take 1 tablet by mouth daily.   nystatin powder Apply to groin/affected areas topically as needed   polyethylene glycol (MIRALAX  / GLYCOLAX ) 17 g packet Take 17 g by mouth daily as needed.   Sodium Fluoride (PREVIDENT 5000 BOOSTER PLUS) 1.1 % PSTE Place 1 Application onto teeth at bedtime.   Throat Lozenges (COUGH DROPS) LOZG Use as directed in the mouth or throat. 1 unit by mouth every 1 hour as needed for cough   Vitamin D , Ergocalciferol , (DRISDOL ) 1.25 MG (50000 UT) CAPS capsule Take 50,000 Units by mouth every 14 (fourteen) days.   No facility-administered encounter medications on file as of 09/25/2024.    Review of Systems ***  Immunization History  Administered Date(s) Administered   Influenza Inj Mdck Quad Pf 08/25/2021, 08/31/2022   Influenza-Unspecified 08/27/2014, 09/03/2015, 08/31/2016, 08/29/2017, 09/21/2023, 09/11/2024   Moderna Covid-19 Fall Seasonal Vaccine 36yrs & older 03/08/2023   Moderna Sars-Covid-2 Vaccination 12/11/2019, 01/08/2020, 10/08/2020, 08/20/2021   Pneumococcal Conjugate-13 03/21/2014, 06/02/2017   Pneumococcal Polysaccharide-23 04/08/2016   Tdap 12/05/2017   Unspecified SARS-COV-2 Vaccination 08/26/2023, 03/09/2024   Pertinent  Health Maintenance Due  Topic Date Due   Influenza Vaccine  Completed      11/24/2022   10:00 PM 11/25/2022    8:30 PM 11/26/2022    9:00 AM 04/24/2023    9:06 PM 03/15/2024    3:16 PM  Fall Risk  Falls in the past year?    1 1  Was there an injury with Fall?    1 0  Fall Risk Category Calculator    3 1  (RETIRED) Patient Fall Risk Level High fall risk  High fall risk  High fall risk     Patient at Risk for Falls Due to    History of fall(s);Impaired balance/gait;Impaired mobility History of fall(s);Impaired mobility  Fall risk Follow up    Falls prevention discussed Falls evaluation completed;Education provided     Data saved with a previous flowsheet row definition   Functional Status Survey:    Vitals:    09/25/24 1037 09/25/24 1043  BP: (!) 162/76 126/78  Pulse: 80   Resp: 20   Temp: 97.7 F (36.5 C)   SpO2: 96%   Weight: 180 lb 11.2 oz (82 kg)   Height: 5' 8 (1.727 m)    Body mass index is 27.48 kg/m. Physical Exam***  Labs reviewed: Recent Labs    11/16/23 0552 11/17/23 0603 11/18/23 0559  NA 133* 133* 136  K 4.0 3.9 4.2  CL 102 100 101  CO2 24 24 26   GLUCOSE 88 84 93  BUN 22 25* 28*  CREATININE 1.29* 1.53* 1.45*  CALCIUM  9.1 9.1 9.1   Recent Labs    09/29/23 0000 11/15/23 0928  AST 17 24  ALT 19 25  ALKPHOS 95 112  BILITOT  --  0.8  PROT  --  7.5  ALBUMIN  3.6 3.5   Recent Labs    11/15/23  9071 11/16/23 0552 11/17/23 0603 11/18/23 0559 04/02/24 0000  WBC 9.6 10.3 10.4 10.5 9.9  NEUTROABS 6.8 6.9  --   --  6,455.00  HGB 14.9 13.3 13.5 14.3 13.1*  HCT 46.2 40.7 40.4 42.7 41  MCV 93.0 90.8 89.2 89.9  --   PLT 482* 488* 489* 520* 584*   No results found for: TSH Lab Results  Component Value Date   HGBA1C 5.6 11/15/2023   Lab Results  Component Value Date   CHOL 154 04/02/2024   HDL 41 04/02/2024   LDLCALC 94 04/02/2024   TRIG 97 04/02/2024   CHOLHDL 3.5 11/15/2023    Significant Diagnostic Results in last 30 days:  No results found.  Assessment/Plan No problem-specific Assessment & Plan notes found for this encounter.     Olita Takeshita K. Caro BODILY [facility] [phone]

## 2024-10-01 LAB — BASIC METABOLIC PANEL WITH GFR
BUN: 22 — AB (ref 4–21)
CO2: 30 — AB (ref 13–22)
Chloride: 98 — AB (ref 99–108)
Creatinine: 1.3 (ref 0.6–1.3)
Glucose: 98
Potassium: 5 meq/L (ref 3.5–5.1)
Sodium: 134 — AB (ref 137–147)

## 2024-10-01 LAB — HEPATIC FUNCTION PANEL
ALT: 17 U/L (ref 10–40)
AST: 17 (ref 14–40)
Alkaline Phosphatase: 93 (ref 25–125)
Bilirubin, Total: 0.6

## 2024-10-01 LAB — COMPREHENSIVE METABOLIC PANEL WITH GFR
Albumin: 3.8 (ref 3.5–5.0)
Calcium: 9.6 (ref 8.7–10.7)
Globulin: 3.2
eGFR: 53

## 2024-10-01 LAB — CBC AND DIFFERENTIAL
HCT: 46 (ref 41–53)
Hemoglobin: 14.6 (ref 13.5–17.5)
Neutrophils Absolute: 7150
Platelets: 743 K/uL — AB (ref 150–400)
WBC: 10.2

## 2024-10-01 LAB — CBC: RBC: 4.96 (ref 3.87–5.11)

## 2024-10-22 ENCOUNTER — Encounter: Payer: Self-pay | Admitting: Internal Medicine

## 2024-10-22 ENCOUNTER — Non-Acute Institutional Stay (SKILLED_NURSING_FACILITY): Payer: Self-pay | Admitting: Internal Medicine

## 2024-10-22 DIAGNOSIS — E559 Vitamin D deficiency, unspecified: Secondary | ICD-10-CM

## 2024-10-22 DIAGNOSIS — N1831 Chronic kidney disease, stage 3a: Secondary | ICD-10-CM | POA: Diagnosis not present

## 2024-10-22 DIAGNOSIS — I10 Essential (primary) hypertension: Secondary | ICD-10-CM

## 2024-10-22 DIAGNOSIS — Z8673 Personal history of transient ischemic attack (TIA), and cerebral infarction without residual deficits: Secondary | ICD-10-CM | POA: Insufficient documentation

## 2024-10-22 DIAGNOSIS — Z936 Other artificial openings of urinary tract status: Secondary | ICD-10-CM

## 2024-10-22 DIAGNOSIS — E782 Mixed hyperlipidemia: Secondary | ICD-10-CM | POA: Diagnosis not present

## 2024-10-22 DIAGNOSIS — Z8551 Personal history of malignant neoplasm of bladder: Secondary | ICD-10-CM | POA: Insufficient documentation

## 2024-10-22 DIAGNOSIS — M4712 Other spondylosis with myelopathy, cervical region: Secondary | ICD-10-CM

## 2024-10-22 NOTE — Assessment & Plan Note (Addendum)
 10/22/24 check routine labs. CMP, CBC.  He remains on lisinopril  5 mg daily for renal protection for hypertension.

## 2024-10-22 NOTE — Assessment & Plan Note (Addendum)
 10/22/24 S/p cystectomy for bladder cancer.  Stable.

## 2024-10-22 NOTE — Assessment & Plan Note (Addendum)
 10/22/24 continue Lipitor 40 mg daily

## 2024-10-22 NOTE — Assessment & Plan Note (Signed)
Stable.  No evidence of recurrence.

## 2024-10-22 NOTE — Assessment & Plan Note (Addendum)
 10/22/24 continue with physical therapy

## 2024-10-22 NOTE — Assessment & Plan Note (Addendum)
 10/22/24 Continue with aspirin  81 mg daily, Lipitor 40 mg daily.

## 2024-10-22 NOTE — Assessment & Plan Note (Addendum)
 10/22/24 Continue lisinopril  5 mg daily.  Check CMP.

## 2024-10-22 NOTE — Assessment & Plan Note (Addendum)
 10/22/24 continue with vitamin D  supplementation 5000 units every other Friday

## 2024-10-22 NOTE — Progress Notes (Signed)
 [facility] Routine Visit Progress Note    Location:  Other Nursing Home Room Number: [facility] Place of Service:  SNF (31)   [name], DO   Patient Care Team: [name], DO as PCP - General (Internal Medicine) [name], MD as Consulting Physician (Urology) [name], MD as Consulting Physician (Oncology) [name], MD as Referring Physician (Vascular Surgery)   Extended Emergency Contact Information Primary Emergency Contact: warner,[name] Mobile Phone: [phone] Relation: Daughter Interpreter needed? No Secondary Emergency Contact: [name],[name] Home Phone: [phone] Mobile Phone: [phone] Relation: Daughter   Goals of care: Advanced Directive information    07/03/2024   12:05 PM  Advanced Directives  Does Patient Have a Medical Advance Directive? Yes  Type of Estate Agent of [place];Living will  Does patient want to make changes to medical advance directive? No - Patient declined  Copy of Healthcare Power of Attorney in Chart? Yes - validated most recent copy scanned in chart (See row information)    CODE STATUS: Full Code Do Not Resuscitate (DNR)   Chief Complaint  Patient presents with   Medical Management of Chronic Issues    Routine monthly visit     HPI: Pt is a 83 y.o. male seen today for medical management of chronic disease.  Patient is being seen for his routine monthly visit.  Philip [name] is an 83 year old male with a history of hypertension, hyperlipidemia, CKD stage III, prior history of stroke, history of bladder cancer status post cystectomy with urostomy who lives at [facility]. [place] room 502.  He is in long-term care.  I am meeting him for the first time.  Introduced myself as the new wellsite geologist here at [facility].  He has been at [facility] in long-term care since 2019-11-08.  His wife passed away several years ago.  He is an avid reader.  Still works on his  computer.  Likes to watch movies.  He states that he is going to physical therapy 5 days a week.  He is walking better.  No plans for him to move out of long-term care.  He has several children and grandchildren that live locally.  Overall he is very happy with his life here at [facility].  He has no health concerns at this point.  His medications are stable.  Nursing without any concerns.  Discussing his wife, the patient states that he met his wife during a dance class when he went to a [place]  [employer] to participate in a dance lesson.  Patient and his deceased wife have traveled extensively.  They have been all over the world.  They have traveled to all 50 [place]  states.  He has traveled to [place], [place], [place], [place], [place], [place].  He has visited several countries.  He used to be a technical brewer.  He has several pictures in his room that he is very proud of.  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 chin. Nodular and infiltrative patterns.EDC.   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 Nodular. EDC   Basal cell carcinoma 08/26/2020   Right mid to inf. ear helix. Nodular, ulcerated. 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 Ulcerated.   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   L upper eyelid, exc and ED, pt had BCC of L upper eyelid and excision also included this SCC   Past Surgical History:  Procedure Laterality Date   BACK SURGERY     CATARACT EXTRACTION     HERNIA REPAIR     PORTA CATH INSERTION N/A 01/01/2019   Procedure: PORTA CATH INSERTION;  Surgeon: [name], MD;  Location: [facility] INVASIVE CV LAB;  Service: Cardiovascular;  Laterality: N/A;   TONSILLECTOMY     TRANSURETHRAL RESECTION OF BLADDER TUMOR N/A 12/08/2018   Procedure: TRANSURETHRAL RESECTION OF BLADDER TUMOR (TURBT);  Surgeon: [name], MD;  Location: [facility] ORS;  Service: Urology;  Laterality: N/A;     No Known Allergies   Outpatient Encounter Medications as of 10/22/2024  Medication Sig   acetaminophen  (TYLENOL ) 500 MG tablet Take 1,000 mg by mouth every 8 (eight) hours as needed.   aluminum-magnesium  hydroxide 200-200 MG/5ML suspension Take 10 mLs by mouth every 4 (four) hours as needed for indigestion.   aspirin  EC 81 MG tablet Take 81 mg by mouth daily. Swallow whole.   atorvastatin  (LIPITOR) 40 MG tablet Take 1 tablet (40 mg total) by mouth at bedtime.   chlorhexidine (PERIDEX) 0.12 % solution Use as directed in the mouth or throat at bedtime. 0.5 ounce by mouth at bedtime for mouth care (swish for 30 seconds and spit after evening mouth care. DO NOT RINSE.)   lisinopril  (ZESTRIL ) 5 MG tablet Take 1 tablet (5 mg total) by mouth daily.   magnesium  chloride (SLOW-MAG) 64 MG TBEC SR tablet Take 1 tablet by mouth daily.   Multiple Vitamins-Minerals (CENTRUM MEN) TABS Take 1 tablet by mouth daily.   nystatin powder Apply to groin/affected areas topically as needed   polyethylene glycol (MIRALAX  / GLYCOLAX ) 17 g packet Take 17 g by mouth daily as needed.   Sodium Fluoride (PREVIDENT 5000 BOOSTER PLUS) 1.1 % PSTE Place 1 Application onto teeth at bedtime.   Throat Lozenges (COUGH DROPS) LOZG Use as directed in the mouth or throat. 1 unit by mouth every 1 hour as needed for  cough   Vitamin D , Ergocalciferol , (DRISDOL ) 1.25 MG (50000 UT) CAPS capsule Take 50,000 Units by mouth every 14 (fourteen) days.   No facility-administered encounter medications on file as of 10/22/2024.     Review of Systems  Constitutional: Negative.   HENT: Negative.    Eyes: Negative.   Respiratory: Negative.    Cardiovascular: Negative.   Gastrointestinal: Negative.   Endocrine: Negative.   Genitourinary:        Has a urostomy bag  Musculoskeletal: Negative.   Allergic/Immunologic: Negative.   Neurological: Negative.   Hematological: Negative.   Psychiatric/Behavioral: Negative.    All other systems reviewed and are negative.     Immunization History  Administered Date(s) Administered   Influenza Inj Mdck Quad Pf 08/25/2021, 08/31/2022   Influenza-Unspecified  08/27/2014, 09/03/2015, 08/31/2016, 08/29/2017, 09/21/2023, 09/11/2024   Moderna Covid-19 Fall Seasonal Vaccine 77yrs & older 03/08/2023   Moderna Sars-Covid-2 Vaccination 12/11/2019, 01/08/2020, 10/08/2020, 08/20/2021   Pneumococcal Conjugate-13 03/21/2014, 06/02/2017   Pneumococcal Polysaccharide-23 04/08/2016   Tdap 12/05/2017   Unspecified SARS-COV-2 Vaccination 08/26/2023, 03/09/2024   Pertinent  Health Maintenance Due  Topic Date Due   Influenza Vaccine  Completed      11/24/2022   10:00 PM 11/25/2022    8:30 PM 11/26/2022    9:00 AM 04/24/2023    9:06 PM 03/15/2024    3:16 PM  Fall Risk  Falls in the past year?    1 1  Was there an injury with Fall?    1 0  Fall Risk Category Calculator    3 1  (RETIRED) Patient Fall Risk Level High fall risk  High fall risk  High fall risk     Patient at Risk for Falls Due to    History of fall(s);Impaired balance/gait;Impaired mobility History of fall(s);Impaired mobility  Fall risk Follow up    Falls prevention discussed Falls evaluation completed;Education provided     Data saved with a previous flowsheet row definition   Functional Status Survey:      Vitals:   10/22/24 0946  BP: (!) 156/88  Pulse: 73  Resp: 20  Temp: 97.6 F (36.4 C)  SpO2: 95%  Weight: 181 lb 12.8 oz (82.5 kg)  Height: 5' 8 (1.727 m)   Body mass index is 27.64 kg/m. Physical Exam Vitals and nursing note reviewed.  Constitutional:      General: He is not in acute distress.    Appearance: He is not toxic-appearing or diaphoretic.     Comments: Appears slightly younger than his stated age of 65  HENT:     Head: Normocephalic and atraumatic.     Nose: Nose normal.  Eyes:     General: No scleral icterus. Cardiovascular:     Pulses: Normal pulses.     Heart sounds: Normal heart sounds.  Pulmonary:     Effort: Pulmonary effort is normal. No respiratory distress.  Abdominal:     General: Bowel sounds are normal. There is no distension.     Palpations: Abdomen is soft.     Comments: Right lower quadrant urostomy.  Musculoskeletal:     Right lower leg: No edema.     Left lower leg: No edema.  Skin:    General: Skin is warm and dry.     Capillary Refill: Capillary refill takes less than 2 seconds.  Neurological:     Mental Status: He is alert and oriented to person, place, and time.    Labs reviewed: Recent Labs    11/16/23 0552 11/17/23 0603 11/18/23 0559  NA 133* 133* 136  K 4.0 3.9 4.2  CL 102 100 101  CO2 24 24 26   GLUCOSE 88 84 93  BUN 22 25* 28*  CREATININE 1.29* 1.53* 1.45*  CALCIUM  9.1 9.1 9.1   Recent Labs    11/15/23 0928  AST 24  ALT 25  ALKPHOS 112  BILITOT 0.8  PROT 7.5  ALBUMIN  3.5   Recent Labs    11/15/23 0928 11/16/23 0552 11/17/23 0603 11/18/23 0559 04/02/24 0000  WBC 9.6 10.3 10.4 10.5 9.9  NEUTROABS 6.8 6.9  --   --  6,455.00  HGB 14.9 13.3 13.5 14.3 13.1*  HCT 46.2 40.7 40.4 42.7 41  MCV 93.0 90.8 89.2 89.9  --   PLT 482* 488*  489* 520* 584*    Lab Results  Component Value Date   HGBA1C 5.6 11/15/2023   Lab Results  Component Value Date   CHOL 154 04/02/2024   HDL 41 04/02/2024   LDLCALC 94  04/02/2024   TRIG 97 04/02/2024   CHOLHDL 3.5 11/15/2023     Assessment & Plan Chronic kidney disease, stage 3a (HCC) 10/22/24 check routine labs. CMP, CBC.  He remains on lisinopril  5 mg daily for renal protection for hypertension.      Essential hypertension 10/22/24 Continue lisinopril  5 mg daily.  Check CMP.      Mixed hyperlipidemia 10/22/24 continue Lipitor 40 mg daily      Presence of urostomy (HCC) 10/22/24 S/p cystectomy for bladder cancer.  Stable.      Cervical spondylosis with myelopathy 10/22/24 continue with physical therapy      Vitamin D  deficiency 10/22/24 continue with vitamin D  supplementation 5000 units every other Friday     History of bladder cancer Stable. No evidence of recurrence    History of ischemic stroke 10/22/24 Continue with aspirin  81 mg daily, Lipitor 40 mg daily.       Philip Door, DO [facility] [phone]

## 2024-10-22 NOTE — Assessment & Plan Note (Signed)
 10/22/24 Stable. No evidence of recurrence

## 2024-10-29 LAB — CBC AND DIFFERENTIAL
HCT: 44 (ref 41–53)
Hemoglobin: 14.2 (ref 13.5–17.5)
Neutrophils Absolute: 7166
Platelets: 759 K/uL — AB (ref 150–400)
WBC: 10.6

## 2024-10-29 LAB — BASIC METABOLIC PANEL WITH GFR
BUN: 23 — AB (ref 4–21)
CO2: 31 — AB (ref 13–22)
Chloride: 98 — AB (ref 99–108)
Creatinine: 1.4 — AB (ref 0.6–1.3)
Glucose: 85
Potassium: 5.5 meq/L — AB (ref 3.5–5.1)
Sodium: 135 — AB (ref 137–147)

## 2024-10-29 LAB — CBC: RBC: 4.83 (ref 3.87–5.11)

## 2024-10-29 LAB — HEPATIC FUNCTION PANEL
ALT: 15 U/L (ref 10–40)
AST: 15 (ref 14–40)
Alkaline Phosphatase: 98 (ref 25–125)
Bilirubin, Total: 0.6

## 2024-10-29 LAB — COMPREHENSIVE METABOLIC PANEL WITH GFR
Albumin: 3.5 (ref 3.5–5.0)
Calcium: 9.4 (ref 8.7–10.7)
Globulin: 3.1
eGFR: 50

## 2024-11-15 ENCOUNTER — Non-Acute Institutional Stay: Payer: Self-pay | Admitting: Nurse Practitioner

## 2024-11-15 ENCOUNTER — Encounter: Payer: Self-pay | Admitting: Nurse Practitioner

## 2024-11-15 DIAGNOSIS — E559 Vitamin D deficiency, unspecified: Secondary | ICD-10-CM

## 2024-11-15 DIAGNOSIS — I10 Essential (primary) hypertension: Secondary | ICD-10-CM

## 2024-11-15 DIAGNOSIS — Z8673 Personal history of transient ischemic attack (TIA), and cerebral infarction without residual deficits: Secondary | ICD-10-CM

## 2024-11-15 DIAGNOSIS — D75839 Thrombocytosis, unspecified: Secondary | ICD-10-CM | POA: Diagnosis not present

## 2024-11-15 DIAGNOSIS — N1831 Chronic kidney disease, stage 3a: Secondary | ICD-10-CM

## 2024-11-15 DIAGNOSIS — Z936 Other artificial openings of urinary tract status: Secondary | ICD-10-CM

## 2024-11-15 DIAGNOSIS — E782 Mixed hyperlipidemia: Secondary | ICD-10-CM | POA: Diagnosis not present

## 2024-11-15 NOTE — Assessment & Plan Note (Signed)
 Reports chronic cough- will stop lisinopril  and start losartan 25 mg daily

## 2024-11-15 NOTE — Assessment & Plan Note (Signed)
 Stable without deficit, Continue with aspirin  81 mg daily, Lipitor 40 mg daily.

## 2024-11-15 NOTE — Assessment & Plan Note (Signed)
 S/p cystectomy for bladder cancer.  Stable.

## 2024-11-15 NOTE — Assessment & Plan Note (Signed)
 Continues on lipitor, monitor lipids

## 2024-11-15 NOTE — Assessment & Plan Note (Signed)
 continue with vitamin D  supplementation 50000 units every other Friday

## 2024-11-15 NOTE — Progress Notes (Signed)
 Location:  Other [place].  Nursing Home Room Number: [facility] Place of Service:  SNF [phone]) Harlene An, NP  PCP: [name], DO  Patient Care Team: [name], DO as PCP - General (Internal Medicine) [name], MD as Consulting Physician (Urology) [name], MD as Consulting Physician (Oncology) [name], MD as Referring Physician (Vascular Surgery)  Extended Emergency Contact Information Primary Emergency Contact: warner,[name] Mobile Phone: [phone] Relation: Daughter Interpreter needed? No Secondary Emergency Contact: [name],[name] Home Phone: [phone] Mobile Phone: [phone] Relation: Daughter  Goals of care: Advanced Directive information    11/15/2024    9:31 AM  Advanced Directives  Does Patient Have a Medical Advance Directive? Yes  Type of Estate Agent of [place];Living will;Out of facility DNR (pink MOST or yellow form)  Does patient want to make changes to medical advance directive? No - Patient declined  Copy of Healthcare Power of Attorney in Chart? Yes - validated most recent copy scanned in chart (See row information)     Chief Complaint  Patient presents with   Medical Management of Chronic Issues    Medical Management of Chronic Issues.     HPI:  Pt is a 83 y.o. male seen today for medical management of chronic disease. Pt with hx of htn, hyperlipidemia, hx of CVA, CKD, bladder cancer s/p urostomy.   Pt reports he has had a chronic cough without congestion, GERD, or shortness of breath  He walks with a walker in facility. No increase or new pain.   Htn- is well controlled.   He has urostomy from bladder cancer- draining well without pain   Reports he eats well.  Staff has no concerns.    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 chin. Nodular and infiltrative patterns.EDC.   Basal cell carcinoma 12/21/2018   Right  sideburn preauricular. Nodular. EDC   Basal cell carcinoma 08/26/2020   Right mid to inf. ear helix. Nodular, ulcerated. 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 Ulcerated.   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   L upper eyelid, exc and ED, pt had BCC of L upper eyelid and excision also included this SCC   Past Surgical History:  Procedure Laterality Date   BACK SURGERY     CATARACT EXTRACTION     HERNIA REPAIR     PORTA CATH INSERTION N/A 01/01/2019   Procedure: PORTA CATH INSERTION;  Surgeon: [name], MD;  Location: [facility] INVASIVE CV LAB;  Service: Cardiovascular;  Laterality: N/A;   TONSILLECTOMY     TRANSURETHRAL RESECTION OF BLADDER TUMOR N/A  12/08/2018   Procedure: TRANSURETHRAL RESECTION OF BLADDER TUMOR (TURBT);  Surgeon: [name], MD;  Location: [facility] ORS;  Service: Urology;  Laterality: N/A;    Allergies[1]  Outpatient Encounter Medications as of 11/15/2024  Medication Sig   acetaminophen  (TYLENOL ) 500 MG tablet Take 1,000 mg by mouth every 8 (eight) hours as needed.   aluminum-magnesium  hydroxide 200-200 MG/5ML suspension Take 10 mLs by mouth every 4 (four) hours as needed for indigestion.   aspirin  EC 81 MG tablet Take 81 mg by mouth daily.  Swallow whole.   atorvastatin  (LIPITOR) 40 MG tablet Take 1 tablet (40 mg total) by mouth at bedtime.   chlorhexidine (PERIDEX) 0.12 % solution Use as directed in the mouth or throat at bedtime. 0.5 ounce by mouth at bedtime for mouth care (swish for 30 seconds and spit after evening mouth care. DO NOT RINSE.)   lisinopril  (ZESTRIL ) 5 MG tablet Take 1 tablet (5 mg total) by mouth daily.   magnesium  chloride (SLOW-MAG) 64 MG TBEC SR tablet Take 1 tablet by mouth daily.   Multiple Vitamins-Minerals (CENTRUM MEN) TABS Take 1 tablet by mouth daily.   nystatin powder Apply to groin/affected areas topically as needed   polyethylene glycol (MIRALAX  / GLYCOLAX ) 17 g packet Take 17 g by mouth daily as needed.   Sodium Fluoride (PREVIDENT 5000 BOOSTER PLUS) 1.1 % PSTE Place 1 Application onto teeth at bedtime.   Throat Lozenges (COUGH DROPS) LOZG Use as directed in the mouth or throat. 1 unit by mouth every 1 hour as needed for cough   Vitamin D , Ergocalciferol , (DRISDOL ) 1.25 MG (50000 UT) CAPS capsule Take 50,000 Units by mouth every 14 (fourteen) days.   No facility-administered encounter medications on file as of 11/15/2024.    Review of Systems  Constitutional:  Negative for activity change, appetite change, fatigue and unexpected weight change.  HENT:  Negative for congestion and hearing loss.   Eyes: Negative.   Respiratory:  Negative for cough and shortness of breath.   Cardiovascular:  Negative for chest pain, palpitations and leg swelling.  Gastrointestinal:  Negative for abdominal pain, constipation and diarrhea.  Genitourinary:  Negative for difficulty urinating and dysuria.  Musculoskeletal:  Negative for arthralgias and myalgias.  Skin:  Negative for color change and wound.  Neurological:  Negative for dizziness and weakness.  Psychiatric/Behavioral:  Negative for agitation, behavioral problems and confusion.      Immunization History  Administered Date(s) Administered   Influenza  Inj Mdck Quad Pf 08/25/2021, 08/31/2022   Influenza-Unspecified 08/27/2014, 09/03/2015, 08/31/2016, 08/29/2017, 09/21/2023, 09/11/2024   Moderna Covid-19 Fall Seasonal Vaccine 36yrs & older 03/08/2023   Moderna Sars-Covid-2 Vaccination 12/11/2019, 01/08/2020, 10/08/2020, 08/20/2021   Pneumococcal Conjugate-13 03/21/2014, 06/02/2017   Pneumococcal Polysaccharide-23 04/08/2016   Tdap 12/05/2017   Unspecified SARS-COV-2 Vaccination 08/26/2023, 03/09/2024   Pertinent  Health Maintenance Due  Topic Date Due   Influenza Vaccine  Completed      11/24/2022   10:00 PM 11/25/2022    8:30 PM 11/26/2022    9:00 AM 04/24/2023    9:06 PM 03/15/2024    3:16 PM  Fall Risk  Falls in the past year?    1 1  Was there an injury with Fall?    1  0   Fall Risk Category Calculator    3 1  (RETIRED) Patient Fall Risk Level High fall risk  High fall risk  High fall risk     Patient at Risk for Falls Due to  History of fall(s);Impaired balance/gait;Impaired mobility History of fall(s);Impaired mobility  Fall risk Follow up    Falls prevention discussed Falls evaluation completed;Education provided     Data saved with a previous flowsheet row definition   Functional Status Survey:    Vitals:   11/15/24 0858  BP: 131/71  Pulse: 73  Resp: 18  Temp: 97.7 F (36.5 C)  SpO2: 96%  Weight: 181 lb 11.2 oz (82.4 kg)  Height: 5' 8 (1.727 m)   Body mass index is 27.63 kg/m. Physical Exam Constitutional:      General: He is not in acute distress.    Appearance: He is well-developed. He is not diaphoretic.  HENT:     Head: Normocephalic and atraumatic.     Right Ear: External ear normal.     Left Ear: External ear normal.     Mouth/Throat:     Pharynx: No oropharyngeal exudate.  Eyes:     Conjunctiva/sclera: Conjunctivae normal.     Pupils: Pupils are equal, round, and reactive to light.  Cardiovascular:     Rate and Rhythm: Normal rate and regular rhythm.     Heart sounds: Normal heart sounds.   Pulmonary:     Effort: Pulmonary effort is normal.     Breath sounds: Normal breath sounds.  Abdominal:     General: Bowel sounds are normal.     Palpations: Abdomen is soft.  Musculoskeletal:        General: No tenderness.     Cervical back: Normal range of motion and neck supple.     Right lower leg: No edema.     Left lower leg: No edema.  Skin:    General: Skin is warm and dry.  Neurological:     Mental Status: He is alert and oriented to person, place, and time.     Labs reviewed: Recent Labs    11/17/23 0603 11/18/23 0559 10/01/24 0000 10/29/24 0000  NA 133* 136 134* 135*  K 3.9 4.2 5.0 5.5*  CL 100 101 98* 98*  CO2 24 26 30* 31*  GLUCOSE 84 93  --   --   BUN 25* 28* 22* 23*  CREATININE 1.53* 1.45* 1.3 1.4*  CALCIUM  9.1 9.1 9.6 9.4   Recent Labs    10/01/24 0000 10/29/24 0000  AST 17 15  ALT 17 15  ALKPHOS 93 98  ALBUMIN  3.8 3.5   Recent Labs    11/17/23 0603 11/18/23 0559 04/02/24 0000 10/01/24 0000 10/29/24 0000  WBC 10.4 10.5 9.9 10.2 10.6  NEUTROABS  --   --  6,455.00 7,150.00 7,166.00  HGB 13.5 14.3 13.1* 14.6 14.2  HCT 40.4 42.7 41 46 44  MCV 89.2 89.9  --   --   --   PLT 489* 520* 584* 743* 759*   No results found for: TSH Lab Results  Component Value Date   HGBA1C 5.6 11/15/2023   Lab Results  Component Value Date   CHOL 154 04/02/2024   HDL 41 04/02/2024   LDLCALC 94 04/02/2024   TRIG 97 04/02/2024   CHOLHDL 3.5 11/15/2023    Significant Diagnostic Results in last 30 days:  No results found.  Assessment/Plan Vitamin D  deficiency continue with vitamin D  supplementation 50000 units every other Friday    Presence of urostomy [facility]) S/p cystectomy for bladder cancer.  Stable.   Mixed hyperlipidemia Continues on lipitor, monitor lipids  History of ischemic stroke Stable without deficit, Continue with aspirin  81 mg daily, Lipitor 40 mg  daily.    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 Caro BODILY [facility] [phone]       [1] No Known Allergies

## 2024-11-15 NOTE — Assessment & Plan Note (Signed)
 Platelet count continues to elevate- referral to hematology at this time

## 2024-11-19 LAB — BASIC METABOLIC PANEL WITH GFR
BUN: 20 (ref 4–21)
CO2: 28 — AB (ref 13–22)
Chloride: 97 — AB (ref 99–108)
Creatinine: 1.2 (ref 0.6–1.3)
Glucose: 89
Potassium: 5.4 meq/L — AB (ref 3.5–5.1)
Sodium: 134 — AB (ref 137–147)

## 2024-11-19 LAB — COMPREHENSIVE METABOLIC PANEL WITH GFR
Calcium: 9.6 (ref 8.7–10.7)
eGFR: 62

## 2024-11-20 ENCOUNTER — Telehealth: Payer: Self-pay | Admitting: Internal Medicine

## 2024-11-20 NOTE — Telephone Encounter (Signed)
 Called pt's dtr christine crockett [phone]. Explained the reason for hematology referral. Pt has had slightly elevated platelet counts for about 1 year.  09-2024 had plt cnt of 743K. Repeat in 10-2024 was 759K. Pt on ASA 81 mg daily.  Dtr states she understands the reasoning for hematology referral and will be going to office appointment with patient.  Philip Door, DO [facility]

## 2024-11-27 ENCOUNTER — Inpatient Hospital Stay: Attending: Internal Medicine | Admitting: Internal Medicine

## 2024-11-27 ENCOUNTER — Inpatient Hospital Stay

## 2024-11-27 ENCOUNTER — Telehealth: Payer: Self-pay | Admitting: *Deleted

## 2024-11-27 ENCOUNTER — Encounter: Payer: Self-pay | Admitting: Internal Medicine

## 2024-11-27 VITALS — BP 153/68 | HR 71 | Temp 97.7°F | Resp 18 | Ht 68.0 in | Wt 181.0 lb

## 2024-11-27 DIAGNOSIS — Z8673 Personal history of transient ischemic attack (TIA), and cerebral infarction without residual deficits: Secondary | ICD-10-CM | POA: Insufficient documentation

## 2024-11-27 DIAGNOSIS — Z87891 Personal history of nicotine dependence: Secondary | ICD-10-CM | POA: Diagnosis not present

## 2024-11-27 DIAGNOSIS — Z7982 Long term (current) use of aspirin: Secondary | ICD-10-CM | POA: Insufficient documentation

## 2024-11-27 DIAGNOSIS — Z8551 Personal history of malignant neoplasm of bladder: Secondary | ICD-10-CM | POA: Diagnosis not present

## 2024-11-27 DIAGNOSIS — Z8546 Personal history of malignant neoplasm of prostate: Secondary | ICD-10-CM | POA: Insufficient documentation

## 2024-11-27 DIAGNOSIS — D75839 Thrombocytosis, unspecified: Secondary | ICD-10-CM

## 2024-11-27 DIAGNOSIS — D473 Essential (hemorrhagic) thrombocythemia: Secondary | ICD-10-CM | POA: Insufficient documentation

## 2024-11-27 DIAGNOSIS — Z906 Acquired absence of other parts of urinary tract: Secondary | ICD-10-CM | POA: Insufficient documentation

## 2024-11-27 DIAGNOSIS — I10 Essential (primary) hypertension: Secondary | ICD-10-CM | POA: Diagnosis not present

## 2024-11-27 DIAGNOSIS — Z79899 Other long term (current) drug therapy: Secondary | ICD-10-CM | POA: Diagnosis not present

## 2024-11-27 HISTORY — DX: Thrombocytosis, unspecified: D75.839

## 2024-11-27 LAB — CBC WITH DIFFERENTIAL/PLATELET
Abs Immature Granulocytes: 0.17 K/uL — ABNORMAL HIGH (ref 0.00–0.07)
Basophils Absolute: 0.1 K/uL (ref 0.0–0.1)
Basophils Relative: 1 %
Eosinophils Absolute: 0.7 K/uL — ABNORMAL HIGH (ref 0.0–0.5)
Eosinophils Relative: 6 %
HCT: 39.7 % (ref 39.0–52.0)
Hemoglobin: 12.9 g/dL — ABNORMAL LOW (ref 13.0–17.0)
Immature Granulocytes: 1 %
Lymphocytes Relative: 8 %
Lymphs Abs: 1 K/uL (ref 0.7–4.0)
MCH: 28.7 pg (ref 26.0–34.0)
MCHC: 32.5 g/dL (ref 30.0–36.0)
MCV: 88.4 fL (ref 80.0–100.0)
Monocytes Absolute: 1 K/uL (ref 0.1–1.0)
Monocytes Relative: 8 %
Neutro Abs: 9.4 K/uL — ABNORMAL HIGH (ref 1.7–7.7)
Neutrophils Relative %: 76 %
Platelets: 1038 K/uL (ref 150–400)
RBC: 4.49 MIL/uL (ref 4.22–5.81)
RDW: 15 % (ref 11.5–15.5)
WBC: 12.4 K/uL — ABNORMAL HIGH (ref 4.0–10.5)
nRBC: 0 % (ref 0.0–0.2)

## 2024-11-27 LAB — TECHNOLOGIST SMEAR REVIEW: Plt Morphology: INCREASED

## 2024-11-27 LAB — COMPREHENSIVE METABOLIC PANEL WITH GFR
ALT: 18 U/L (ref 0–44)
AST: 24 U/L (ref 15–41)
Albumin: 3.3 g/dL — ABNORMAL LOW (ref 3.5–5.0)
Alkaline Phosphatase: 121 U/L (ref 38–126)
Anion gap: 11 (ref 5–15)
BUN: 17 mg/dL (ref 8–23)
CO2: 26 mmol/L (ref 22–32)
Calcium: 9.8 mg/dL (ref 8.9–10.3)
Chloride: 94 mmol/L — ABNORMAL LOW (ref 98–111)
Creatinine, Ser: 1.18 mg/dL (ref 0.61–1.24)
GFR, Estimated: >60 mL/min
Glucose, Bld: 87 mg/dL (ref 70–99)
Potassium: 4.7 mmol/L (ref 3.5–5.1)
Sodium: 131 mmol/L — ABNORMAL LOW (ref 135–145)
Total Bilirubin: 0.4 mg/dL (ref 0.0–1.2)
Total Protein: 7.3 g/dL (ref 6.5–8.1)

## 2024-11-27 LAB — IRON AND TIBC
Iron: 23 ug/dL — ABNORMAL LOW (ref 45–182)
Saturation Ratios: 10 % — ABNORMAL LOW (ref 17.9–39.5)
TIBC: 220 ug/dL — ABNORMAL LOW (ref 250–450)
UIBC: 197 ug/dL

## 2024-11-27 LAB — FERRITIN: Ferritin: 280 ng/mL (ref 24–336)

## 2024-11-27 LAB — LACTATE DEHYDROGENASE: LDH: 248 U/L — ABNORMAL HIGH (ref 105–235)

## 2024-11-27 NOTE — Progress Notes (Signed)
 No questions/concerns at this time.

## 2024-11-27 NOTE — Progress Notes (Signed)
 [facility] CONSULT NOTE  Patient Care Team: [name], DO as PCP - General (Internal Medicine) [name], MD as Consulting Physician (Urology) [name], MD as Consulting Physician (Oncology) [name], MD as Referring Physician (Vascular Surgery) Rennie Cindy SAUNDERS, MD as Consulting Physician (Oncology)  CHIEF COMPLAINTS/PURPOSE OF CONSULTATION: Thrombocytosis.   HEMATOLOGY HISTORY  # THROMBOCYTOSIS [platelets- ; Hb; white count]; CT/US -   Latest Reference Range & Units 01/11/19 08:27 01/18/19 08:41 02/01/19 08:49 02/08/19 08:59 02/22/19 08:28 03/01/19 08:48 03/15/19 08:29 03/22/19 08:28 03/28/19 10:54 05/26/19 10:29 11/23/22 22:49 11/25/22 06:19 01/27/23 00:00 09/29/23 00:00 11/15/23 09:28 11/16/23 05:52 11/17/23 06:03 11/18/23 05:59 04/02/24 00:00 10/01/24 00:00 10/29/24 00:00  Platelets 150 - 400 K/uL 251 139 (L) 309 203 289 214 332 216 87 (L) 192 482 (H) 433 (H) 419 ! (E) 470 ! (E) 482 (H) 488 (H) 489 (H) 520 (H) 584 ! (E) 743 ! (E) 759 ! (E)  (L): Data is abnormally low (H): Data is abnormally high !: Data is abnormal (E): External lab result    HISTORY OF PRESENTING ILLNESS: Patient ambulating- with assistance- wheel chair/walker. Accompanied by daughter, [place]- skill nursling.   Philip [name] 83 y.o.  male pleasant patient was been referred to us  for further evaluation of elevated platelets which was incidentally found on blood work.   Discussed the use of AI scribe software for clinical note transcription with the patient, who gave verbal consent to proceed.  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  He has had a gradual increase in platelet counts over several years, with recent values in the 700,000s (743,000 and  759,000). He reports no burning pain in the hands or feet. He takes daily aspirin , initiated after transient ischemic attacks last December.  He has a significant history of cerebrovascular events, including transient ischemic attacks and a stroke one year ago, which caused temporary left-sided weakness and loss of hand function. He has since regained full use of his hand and denies current focal weakness or neurological deficits. MRI of the brain in April demonstrated chronic and acute infarctions in the occipital lobes. He has no current neurological complaints.  Mobility is limited due to a back fracture sustained after a fall two years ago, resulting in persistent functional decline. He ambulates slowly with a walker within his room and to the dining room, but otherwise uses a wheelchair for longer distances. He continues to receive physical therapy.  He has a history of bladder cancer treated with neoadjuvant intravenous chemotherapy and cystectomy with urostomy approximately five years ago, which he tolerated well. He also has a history of prostate cancer, though details of treatment and current status were not discussed during this visit.       Review of Systems  Constitutional:  Negative for chills, diaphoresis, fever, malaise/fatigue and weight loss.  HENT:  Negative for nosebleeds and sore throat.   Eyes:  Negative for double vision.  Respiratory:  Negative for cough, hemoptysis, sputum production, shortness of breath and wheezing.   Cardiovascular:  Negative for chest pain, palpitations, orthopnea and leg swelling.  Gastrointestinal:  Negative for abdominal pain, blood in stool, constipation, diarrhea, heartburn, melena, nausea and vomiting.  Genitourinary:  Negative for dysuria, frequency and urgency.  Musculoskeletal:  Negative for back pain and joint pain.  Skin: Negative.  Negative for itching and rash.  Neurological:  Negative for dizziness, tingling, focal weakness, weakness and  headaches.  Endo/Heme/Allergies:  Does not bruise/bleed easily.  Psychiatric/Behavioral:  Negative for depression. The patient is not nervous/anxious and does not have insomnia.      MEDICAL HISTORY:  Past Medical History:  Diagnosis Date   Basal cell carcinoma 10/02/2018   Left lat. chin. Nodular and infiltrative patterns.EDC.   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 Nodular. EDC   Basal cell carcinoma 08/26/2020   Right mid to inf. ear helix. Nodular, ulcerated. 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 Ulcerated.   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    SURGICAL HISTORY: Past Surgical History:  Procedure Laterality Date   BACK SURGERY     CATARACT EXTRACTION     HERNIA REPAIR     PORTA CATH INSERTION N/A 01/01/2019   Procedure: PORTA CATH INSERTION;  Surgeon: [name], MD;  Location: [facility] INVASIVE CV LAB;  Service: Cardiovascular;  Laterality: N/A;   TONSILLECTOMY     TRANSURETHRAL RESECTION OF BLADDER TUMOR N/A 12/08/2018   Procedure: TRANSURETHRAL RESECTION OF BLADDER TUMOR (TURBT);  Surgeon: [name], MD;  Location: [facility] ORS;  Service: Urology;  Laterality: N/A;    SOCIAL HISTORY: Social History   Socioeconomic History   Marital status: Widowed    Spouse name: Not on file   Number of children: 4   Years of education: Not on file   Highest education level: Not on file  Occupational History   Not on file  Tobacco Use   Smoking status: Former    Current packs/day: 0.00    Types: Cigarettes    Start date: 11/29/1958    Quit date: 11/29/1993    Years since quitting: 31.0   Smokeless tobacco: Never  Vaping Use   Vaping status: Never Used  Substance and Sexual Activity   Alcohol use: Yes    Comment: a couple of drinks a day all the above   Drug use: Never   Sexual activity: Not Currently    Partners: Female  Other Topics Concern   Not on file  Social History Narrative   Not on file   Social Drivers of Health   Tobacco Use: Medium Risk (11/27/2024)   Patient History    Smoking Tobacco Use: Former    Smokeless Tobacco Use: Never    Passive Exposure: Not on file  Financial Resource Strain: Low Risk  (02/01/2024)  Received from [facility]   Overall Financial Resource Strain (CARDIA)    Difficulty of Paying Living Expenses: Not hard at all  Food Insecurity: No Food Insecurity (11/27/2024)   Epic    Worried About Running Out of Food in the Last Year: Never true    Ran Out of Food in the Last Year: Never true  Transportation Needs: No Transportation Needs (11/27/2024)   Epic    Lack of Transportation (Medical): No    Lack of Transportation (Non-Medical): No  Physical Activity: Not on file  Stress: Not on file  Social Connections: Not on file  Intimate Partner Violence: Not At Risk (11/27/2024)   Epic    Fear of Current or Ex-Partner: No    Emotionally Abused: No    Physically Abused: No    Sexually Abused: No  Depression (PHQ2-9): Low Risk (11/27/2024)   Depression (PHQ2-9)    PHQ-2 Score: 0  Alcohol Screen: Not on file  Housing: Low Risk  (11/27/2024)   Epic    Unable to Pay for Housing in the Last Year: No    Number of Times Moved in the Last Year: 0    Homeless in the Last Year: No  Utilities: Not At Risk (11/27/2024)   Epic    Threatened with loss of utilities: No  Health Literacy: Not on file    FAMILY HISTORY: Family History  Problem Relation Age of Onset   Heart failure Mother     ALLERGIES:  has no known allergies.  MEDICATIONS:  Current Outpatient Medications  Medication Sig Dispense Refill   acetaminophen  (TYLENOL ) 500 MG tablet Take 1,000 mg by mouth every 8 (eight) hours as needed.     aluminum-magnesium  hydroxide 200-200 MG/5ML suspension Take 10 mLs by mouth every 4 (four) hours as needed for indigestion.     aspirin  EC 81 MG tablet Take 81 mg by mouth daily. Swallow whole.     atorvastatin  (LIPITOR) 40 MG tablet Take 1 tablet (40 mg total) by mouth at bedtime. 30 tablet 0   chlorhexidine (PERIDEX) 0.12 % solution Use as directed in the mouth or throat at bedtime. 0.5 ounce by mouth at bedtime for mouth care (swish for 30 seconds and spit after evening mouth care. DO NOT RINSE.)     losartan (COZAAR) 25 MG tablet Take 25 mg by mouth daily.     magnesium  chloride (SLOW-MAG) 64 MG TBEC SR tablet Take 1 tablet by mouth daily.     Multiple Vitamins-Minerals (CENTRUM MEN) TABS Take 1 tablet by mouth daily.     polyethylene glycol (MIRALAX  / GLYCOLAX ) 17 g packet Take 17 g by mouth daily as needed.     Sodium Fluoride (PREVIDENT 5000 BOOSTER PLUS) 1.1 % PSTE Place 1 Application onto teeth at bedtime.     Throat Lozenges (COUGH DROPS) LOZG Use as directed in the mouth or throat. 1 unit by mouth every 1 hour as needed for cough     Vitamin D , Ergocalciferol , (DRISDOL ) 1.25 MG (50000 UT) CAPS capsule Take 50,000 Units by mouth every 14 (fourteen) days.     No current facility-administered medications for this visit.     PHYSICAL EXAMINATION:   Vitals:   11/27/24 1059  BP: (!) 153/68  Pulse: 71  Resp:  18  Temp: 97.7 F (36.5 C)  SpO2: 95%   Filed Weights   11/27/24 1059  Weight: 181 lb (82.1 kg)    Physical Exam Vitals and nursing note reviewed.  HENT:  Head: Normocephalic and atraumatic.     Mouth/Throat:     Pharynx: Oropharynx is clear.  Eyes:     Extraocular Movements: Extraocular movements intact.     Pupils: Pupils are equal, round, and reactive to light.  Cardiovascular:     Rate and Rhythm: Normal rate and regular rhythm.  Pulmonary:     Comments: Decreased breath sounds bilaterally.  Abdominal:     Palpations: Abdomen is soft.  Musculoskeletal:        General: Normal range of motion.     Cervical back: Normal range of motion.  Skin:    General: Skin is warm.  Neurological:     General: No focal deficit present.     Mental Status: He is alert and oriented to person, place, and time.  Psychiatric:        Behavior: Behavior normal.        Judgment: Judgment normal.      LABORATORY DATA:  I have reviewed the data as listed Lab Results  Component Value Date   WBC 10.6 10/29/2024   HGB 14.2 10/29/2024   HCT 44 10/29/2024   MCV 89.9 11/18/2023   PLT 759 (A) 10/29/2024   Recent Labs    10/01/24 0000 10/29/24 0000  NA 134* 135*  K 5.0 5.5*  CL 98* 98*  CO2 30* 31*  BUN 22* 23*  CREATININE 1.3 1.4*  CALCIUM  9.6 9.4  ALBUMIN  3.8 3.5  AST 17 15  ALT 17 15  ALKPHOS 93 98     No results found.  ASSESSMENT & PLAN:   Thrombocythemia # Thrombocytosis -question essential thrombocytosis versus reactive [clinically less likely].  Patient had history of stroke/TIAs-with decreased mobility.   # I had a Long discussion with the patient regarding other potential causes of the abnormal blood counts-including reactive [infection inflammation-iatrogenic like splenectomy].  Also possibility of bone marrow disorder like MPN/essential thrombocytosis.  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  For now I  recommend checking CBC;CMP jak 2 BCR ABL; MPL; CALR mutation on the  peripheral blood.LDH;  Check peripheral smear. Also discussed regarding bone marrow biopsy with the above workup is inconclusive; however I would prefer not to do a bone marrow unless absolutely needed.  # If this is diagnosis MPN I discussed the need for Hydrea to improve his platelet count in the context of his elevated platelets-especially given his history of stroke/TIAs  # History of stroke/TIA- [Limited mobility]-currently on aspirin .  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 Ethan- s/p cystectomy- [[facility]]-no concern for any recurrence.  Thank you Ms. Lina NP for allowing me to participate in the care of your pleasant patient. Please do not hesitate to contact me with questions or concerns in the interim.  # Patient follow-up with me in approximately 2  weeks to review the above results. All questions were answered. The patient knows to call the clinic with any problems, questions or concerns.  # DISPOSITION: # labs today- ordered- CBC;CMP jak 2 BCR ABL; MPL; CALR mutation on the peripheral blood.LDH; iron studies and ferritin # follow up in appx 2  weeks- MD; no labs- Dr.B  # 45 minutes face-to-face with the patient discussing the above plan of care; more than 50% of time spent on prognosis/ natural history; counseling and coordination.          Cindy JONELLE Joe, MD 11/27/2024 12:58 PM

## 2024-11-27 NOTE — Telephone Encounter (Signed)
 Critical lab called by [facility] in lab; Platelets 1,038. Read back. Dr B notified.

## 2024-11-27 NOTE — Assessment & Plan Note (Addendum)
#   Thrombocytosis -question essential thrombocytosis versus reactive [clinically less likely].  Patient had history of stroke/TIAs-with decreased mobility.   # I had a Long discussion with the patient regarding other potential causes of the abnormal blood counts-including reactive [infection inflammation-iatrogenic like splenectomy].  Also possibility of bone marrow disorder like MPN/essential thrombocytosis.  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  For now I  recommend checking CBC;CMP jak 2 BCR ABL; MPL; CALR mutation on the peripheral blood.LDH;  Check peripheral smear. Also discussed regarding bone marrow biopsy with the above workup is inconclusive; however I would prefer not to do a bone marrow unless absolutely needed.  # If this is diagnosis MPN I discussed the need for Hydrea to improve his platelet count in the context of his elevated platelets-especially given his history of stroke/TIAs  # History of stroke/TIA- [Limited mobility]-currently on aspirin .  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 Ethan- s/p cystectomy- [[facility]]-no concern for any recurrence.  Thank you Ms. Lina NP for allowing me to participate in the care of your pleasant patient. Please do not hesitate to contact me with questions or concerns in the interim.  # Patient follow-up with me in approximately 2  weeks to review the above results. All questions were answered. The patient knows to call the clinic with any problems, questions or concerns.  # DISPOSITION: # labs today- ordered- CBC;CMP jak 2 BCR ABL; MPL; CALR mutation on the peripheral blood.LDH; iron studies and ferritin # follow up in appx 2  weeks- MD; no labs- Dr.B  # 45 minutes face-to-face with the patient discussing the above plan of care; more than 50% of time spent on prognosis/ natural history; counseling and coordination.

## 2024-12-03 LAB — BCR-ABL1 FISH
Cells Analyzed: 200
Cells Counted: 200

## 2024-12-04 LAB — JAK2 GENOTYPR

## 2024-12-05 ENCOUNTER — Telehealth: Payer: Self-pay | Admitting: *Deleted

## 2024-12-05 ENCOUNTER — Telehealth: Payer: Self-pay | Admitting: Internal Medicine

## 2024-12-05 ENCOUNTER — Other Ambulatory Visit: Payer: Self-pay | Admitting: *Deleted

## 2024-12-05 MED ORDER — HYDROXYUREA 500 MG PO CAPS: 500.0000 mg | ORAL_CAPSULE | Freq: Every day | ORAL | 0 refills | Status: AC

## 2024-12-05 NOTE — Telephone Encounter (Signed)
 Spoke to [employer] Nurse at [facility] and informed her of new medication orders and that I would fax the prescription to her at [phone] for hydrea .

## 2024-12-05 NOTE — Telephone Encounter (Signed)
 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  Daughter will discuss with her father.  Patient having dental workup-recent extraction done-at Pharmacist, hospital, [address] [place].  Discussed risk of increased bleeding with platelets more than 1 million.  The daughter will also call the dentist office.  Andrea/michelle-   Please reach out to dentist office-and had the dentist call me directly re: this pt.   Recommend patient start Hydrea  at 500 mg a day number 30 pills -no refills-please send prescription to [facility].   Thank you, GB

## 2024-12-10 ENCOUNTER — Ambulatory Visit: Payer: Self-pay | Admitting: Internal Medicine

## 2024-12-10 LAB — MPL MUTATION ANALYSIS

## 2024-12-10 NOTE — Telephone Encounter (Signed)
 Discussed with dentist-Dr. Gaitan-recommend holding off any dental extraction until further stabilization of patient's platelet counts-unless there is an emergency.  Continue Hydrea  for now-   Will reevaluate the patient at next visit/ next week.  GB

## 2024-12-11 ENCOUNTER — Ambulatory Visit: Admitting: Dermatology

## 2024-12-11 LAB — CALRETICULIN (CALR) MUTATION ANALYSIS

## 2024-12-18 ENCOUNTER — Inpatient Hospital Stay

## 2024-12-18 ENCOUNTER — Inpatient Hospital Stay: Attending: Internal Medicine | Admitting: Internal Medicine

## 2024-12-18 ENCOUNTER — Encounter: Payer: Self-pay | Admitting: Internal Medicine

## 2024-12-18 ENCOUNTER — Telehealth: Payer: Self-pay | Admitting: *Deleted

## 2024-12-18 VITALS — BP 117/55 | HR 71 | Temp 98.4°F | Resp 18 | Ht 68.0 in | Wt 180.8 lb

## 2024-12-18 DIAGNOSIS — D473 Essential (hemorrhagic) thrombocythemia: Secondary | ICD-10-CM

## 2024-12-18 LAB — CBC WITH DIFFERENTIAL (CANCER CENTER ONLY)
Abs Immature Granulocytes: 0.05 K/uL (ref 0.00–0.07)
Basophils Absolute: 0.1 K/uL (ref 0.0–0.1)
Basophils Relative: 1 %
Eosinophils Absolute: 0.8 K/uL — ABNORMAL HIGH (ref 0.0–0.5)
Eosinophils Relative: 8 %
HCT: 39.9 % (ref 39.0–52.0)
Hemoglobin: 13 g/dL (ref 13.0–17.0)
Immature Granulocytes: 1 %
Lymphocytes Relative: 9 %
Lymphs Abs: 0.9 K/uL (ref 0.7–4.0)
MCH: 28.9 pg (ref 26.0–34.0)
MCHC: 32.6 g/dL (ref 30.0–36.0)
MCV: 88.7 fL (ref 80.0–100.0)
Monocytes Absolute: 0.7 K/uL (ref 0.1–1.0)
Monocytes Relative: 7 %
Neutro Abs: 7.4 K/uL (ref 1.7–7.7)
Neutrophils Relative %: 74 %
Platelet Count: 577 K/uL — ABNORMAL HIGH (ref 150–400)
RBC: 4.5 MIL/uL (ref 4.22–5.81)
RDW: 16.6 % — ABNORMAL HIGH (ref 11.5–15.5)
WBC Count: 9.9 K/uL (ref 4.0–10.5)
nRBC: 0 % (ref 0.0–0.2)

## 2024-12-18 LAB — CMP (CANCER CENTER ONLY)
ALT: 12 U/L (ref 0–44)
AST: 17 U/L (ref 15–41)
Albumin: 3.5 g/dL (ref 3.5–5.0)
Alkaline Phosphatase: 118 U/L (ref 38–126)
Anion gap: 8 (ref 5–15)
BUN: 19 mg/dL (ref 8–23)
CO2: 29 mmol/L (ref 22–32)
Calcium: 9.7 mg/dL (ref 8.9–10.3)
Chloride: 97 mmol/L — ABNORMAL LOW (ref 98–111)
Creatinine: 1.19 mg/dL (ref 0.61–1.24)
GFR, Estimated: 60 mL/min — ABNORMAL LOW
Glucose, Bld: 98 mg/dL (ref 70–99)
Potassium: 4.8 mmol/L (ref 3.5–5.1)
Sodium: 133 mmol/L — ABNORMAL LOW (ref 135–145)
Total Bilirubin: 0.6 mg/dL (ref 0.0–1.2)
Total Protein: 7 g/dL (ref 6.5–8.1)

## 2024-12-18 NOTE — Progress Notes (Signed)
 No concerns today.

## 2024-12-18 NOTE — Assessment & Plan Note (Addendum)
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  BCR-ABL negative.  # At length I discussed the pathophysiology and overall prognosis of JAK2 positive essential thrombocytosis.  Discussed the need for bone marrow biopsy for further evaluation-including evaluation of fibrosis/to rule out myelofibrosis [although he is clinically unlikely].  Given the age/and comorbidities I think is reasonable to hold off of bone marrow biopsy at this time.    # Recommend continued twice-daily aspirin ; along with institution of Hydrea  to better control the blood counts continue Hydrea  500 mg once a day.  # History of stroke/TIA- [Limited mobility]-currently on aspirin .  However would recommend aspirin  81 mg twice a day  # Dental extraction- s/p extraction-discussed with dentistry.  No significant bleeding noted-however noted to have slightly higher bleeding tendency postextraction than other regular patients.  Patient currently does not have any urgent dental issues.  Recommend holding off extraction until at least next visit.  Discussed with patient dentist  # DISPOSITION: # labs today- cbc/cmp; # follow up in appx 4  weeks- MD;  labs- cbc/cmp Dr.B  I spoke at length with the patient's daughter, [place] regarding the patient's clinical status/plan of care.  Family agreement. Also discussed with patients dentist.

## 2024-12-18 NOTE — Progress Notes (Signed)
 [facility] CONSULT NOTE  Patient Care Team: [name], DO as PCP - General (Internal Medicine) [name], MD as Consulting Physician (Urology) [name], MD as Consulting Physician (Oncology) [name], MD as Referring Physician (Vascular Surgery) Rennie Cindy SAUNDERS, MD as Consulting Physician (Oncology)  CHIEF COMPLAINTS/PURPOSE OF CONSULTATION: Thrombocytosis.   HEMATOLOGY HISTORY  # THROMBOCYTOSIS [platelets- ; Hb; white count]; CT/US -   Latest Reference Range & Units 01/11/19 08:27 01/18/19 08:41 02/01/19 08:49 02/08/19 08:59 02/22/19 08:28 03/01/19 08:48 03/15/19 08:29 03/22/19 08:28 03/28/19 10:54 05/26/19 10:29 11/23/22 22:49 11/25/22 06:19 01/27/23 00:00 09/29/23 00:00 11/15/23 09:28 11/16/23 05:52 11/17/23 06:03 11/18/23 05:59 04/02/24 00:00 10/01/24 00:00 10/29/24 00:00  Platelets 150 - 400 K/uL 251 139 (L) 309 203 289 214 332 216 87 (L) 192 482 (H) 433 (H) 419 ! (E) 470 ! (E) 482 (H) 488 (H) 489 (H) 520 (H) 584 ! (E) 743 ! (E) 759 ! 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 Ethan- s/p cystectomy- [[facility]]-no concern for any recurrence.  HISTORY OF PRESENTING ILLNESS: Patient ambulating- with assistance- wheel chair/walker. Accompanied by care giver.  [place] [facility]- skill nursling.   Philip [name] 84 y.o.  male pleasant patient was been referred to us  for further evaluation of elevated platelets which was incidentally found on blood work.   Discussed the use of AI scribe software for clinical note transcription with the patient, who gave verbal consent to proceed.  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 Hydroxyurea  was initiated ten days ago, and he is currently taking this medication without adverse effects, including diarrhea, nausea, vomiting, or cutaneous reactions. He denies new symptoms related to his hematologic condition, including abnormal bleeding, ecchymosis, or palpable masses.  He has a history of cerebral infarction but reports no new neurological deficits. He recently underwent dental extractions and currently has no pain or hemorrhage at the extraction sites and is able to [facility] without difficulty. Additional dental procedures are planned but have not yet occurred.       Review of Systems  Constitutional:  Negative for chills, diaphoresis, fever, malaise/fatigue and weight loss.  HENT:  Negative for nosebleeds and sore throat.   Eyes:  Negative for double vision.  Respiratory:  Negative for cough, hemoptysis, sputum production, shortness of breath and wheezing.   Cardiovascular:  Negative for chest pain, palpitations, orthopnea and leg swelling.  Gastrointestinal:  Negative for abdominal pain, blood in stool, constipation, diarrhea, heartburn, melena, nausea and vomiting.  Genitourinary:  Negative for dysuria, frequency and urgency.  Musculoskeletal:  Negative for back pain and joint pain.  Skin: Negative.  Negative for itching and rash.  Neurological:  Negative for dizziness, tingling, focal weakness, weakness and headaches.  Endo/Heme/Allergies:  Does not bruise/bleed easily.  Psychiatric/Behavioral:  Negative for depression. The patient is not nervous/anxious and does not have insomnia.      MEDICAL HISTORY:  Past Medical History:  Diagnosis Date   Basal cell carcinoma 10/02/2018   Left lat. chin. Nodular and infiltrative patterns.EDC.   Basal  cell carcinoma 12/21/2018   Right sideburn preauricular. Nodular. EDC   Basal  cell carcinoma 08/26/2020   Right mid to inf. ear helix. Nodular, ulcerated. 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 Ulcerated.   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    SURGICAL HISTORY: Past Surgical History:  Procedure Laterality Date   BACK SURGERY     CATARACT EXTRACTION     HERNIA REPAIR     PORTA CATH INSERTION N/A 01/01/2019   Procedure: PORTA CATH INSERTION;  Surgeon: [name], MD;  Location: [facility] INVASIVE CV LAB;  Service: Cardiovascular;  Laterality: N/A;   TONSILLECTOMY     TRANSURETHRAL RESECTION OF BLADDER TUMOR N/A 12/08/2018   Procedure: TRANSURETHRAL RESECTION OF BLADDER TUMOR (TURBT);  Surgeon: [name], MD;  Location: [facility] ORS;  Service: Urology;  Laterality: N/A;    SOCIAL HISTORY: Social History   Socioeconomic History   Marital status: Widowed    Spouse name: Not on file   Number of children: 4   Years of education: Not on file   Highest education level: Not on file  Occupational History   Not on file  Tobacco Use   Smoking status: Former    Current packs/day: 0.00    Types: Cigarettes    Start  date: 11/29/1958    Quit date: 11/29/1993    Years since quitting: 31.0   Smokeless tobacco: Never  Vaping Use   Vaping status: Never Used  Substance and Sexual Activity   Alcohol use: Yes    Comment: a couple of drinks a day all the above   Drug use: Never   Sexual activity: Not Currently    Partners: Female  Other Topics Concern   Not on file  Social History Narrative   Not on file   Social Drivers of Health   Tobacco Use: Medium Risk (12/18/2024)   Patient History    Smoking Tobacco Use: Former    Smokeless Tobacco Use: Never    Passive Exposure: Not on file  Financial Resource Strain: Low Risk  (02/01/2024)   Received from [facility]   Overall Financial Resource Strain (CARDIA)    Difficulty of Paying Living Expenses: Not hard at all  Food Insecurity: No Food Insecurity (11/27/2024)   Epic    Worried About Radiation Protection Practitioner of Food in the Last Year: Never true    Ran Out of Food in the Last Year: Never true  Transportation Needs: No Transportation Needs (11/27/2024)   Epic  Lack of Transportation (Medical): No    Lack of Transportation (Non-Medical): No  Physical Activity: Not on file  Stress: Not on file  Social Connections: Not on file  Intimate Partner Violence: Not At Risk (11/27/2024)   Epic    Fear of Current or Ex-Partner: No    Emotionally Abused: No    Physically Abused: No    Sexually Abused: No  Depression (PHQ2-9): Low Risk (12/18/2024)   Depression (PHQ2-9)    PHQ-2 Score: 0  Alcohol Screen: Not on file  Housing: Low Risk (11/27/2024)   Epic    Unable to Pay for Housing in the Last Year: No    Number of Times Moved in the Last Year: 0    Homeless in the Last Year: No  Utilities: Not At Risk (11/27/2024)   Epic    Threatened with loss of utilities: No  Health Literacy: Not on file    FAMILY HISTORY: Family History  Problem Relation Age of Onset   Heart failure Mother     ALLERGIES:  has no known allergies.  MEDICATIONS:   Current Outpatient Medications  Medication Sig Dispense Refill   acetaminophen  (TYLENOL ) 500 MG tablet Take 1,000 mg by mouth every 8 (eight) hours as needed.     aluminum-magnesium  hydroxide 200-200 MG/5ML suspension Take 10 mLs by mouth every 4 (four) hours as needed for indigestion.     aspirin  EC 81 MG tablet Take 81 mg by mouth in the morning and at bedtime. Swallow whole.     atorvastatin  (LIPITOR) 40 MG tablet Take 1 tablet (40 mg total) by mouth at bedtime. 30 tablet 0   chlorhexidine (PERIDEX) 0.12 % solution Use as directed in the mouth or throat at bedtime. 0.5 ounce by mouth at bedtime for mouth care (swish for 30 seconds and spit after evening mouth care. DO NOT RINSE.)     hydroxyurea  (HYDREA ) 500 MG capsule Take 1 capsule (500 mg total) by mouth daily. May take with food to minimize GI side effects. 30 capsule 0   losartan (COZAAR) 25 MG tablet Take 25 mg by mouth daily.     magnesium  chloride (SLOW-MAG) 64 MG TBEC SR tablet Take 1 tablet by mouth daily.     Multiple Vitamins-Minerals (CENTRUM MEN) TABS Take 1 tablet by mouth daily.     polyethylene glycol (MIRALAX  / GLYCOLAX ) 17 g packet Take 17 g by mouth daily as needed.     Sodium Fluoride (PREVIDENT 5000 BOOSTER PLUS) 1.1 % PSTE Place 1 Application onto teeth at bedtime.     Throat Lozenges (COUGH DROPS) LOZG Use as directed in the mouth or throat. 1 unit by mouth every 1 hour as needed for cough     Vitamin D , Ergocalciferol , (DRISDOL ) 1.25 MG (50000 UT) CAPS capsule Take 50,000 Units by mouth every 14 (fourteen) days.     No current facility-administered medications for this visit.     PHYSICAL EXAMINATION:   Vitals:   12/18/24 1007  BP: (!) 117/55  Pulse: 71  Resp: 18  Temp: 98.4 F (36.9 C)  SpO2: 96%   Filed Weights   12/18/24 1007  Weight: 180 lb 12.8 oz (82 kg)    Physical Exam Vitals and nursing note reviewed.  HENT:     Head: Normocephalic and atraumatic.     Mouth/Throat:     Pharynx:  Oropharynx is clear.  Eyes:     Extraocular Movements: Extraocular movements intact.     Pupils: Pupils are equal, round, and  reactive to light.  Cardiovascular:     Rate and Rhythm: Normal rate and regular rhythm.  Pulmonary:     Comments: Decreased breath sounds bilaterally.  Abdominal:     Palpations: Abdomen is soft.  Musculoskeletal:        General: Normal range of motion.     Cervical back: Normal range of motion.  Skin:    General: Skin is warm.  Neurological:     General: No focal deficit present.     Mental Status: He is alert and oriented to person, place, and time.  Psychiatric:        Behavior: Behavior normal.        Judgment: Judgment normal.      LABORATORY DATA:  I have reviewed the data as listed Lab Results  Component Value Date   WBC 9.9 12/18/2024   HGB 13.0 12/18/2024   HCT 39.9 12/18/2024   MCV 88.7 12/18/2024   PLT 577 (H) 12/18/2024   Recent Labs    10/29/24 0000 11/27/24 1204 12/18/24 1056  NA 135* 131* 133*  K 5.5* 4.7 4.8  CL 98* 94* 97*  CO2 31* 26 29  GLUCOSE  --  87 98  BUN 23* 17 19  CREATININE 1.4* 1.18 1.19  CALCIUM  9.4 9.8 9.7  GFRNONAA  --  >60 60*  PROT  --  7.3 7.0  ALBUMIN  3.5 3.3* 3.5  AST 15 24 17   ALT 15 18 12   ALKPHOS 98 121 118  BILITOT  --  0.4 0.6     No results found.  ASSESSMENT & PLAN:   Essential thrombocytosis (HCC) # High risk essential thrombocytosis- JAK-2-POSITIVE [history of stroke/TIAs-with decreased mobility].  BCR-ABL negative.  # At length I discussed the pathophysiology and overall prognosis of JAK2 positive essential thrombocytosis.  Discussed the need for bone marrow biopsy for further evaluation-including evaluation of fibrosis/to rule out myelofibrosis [although he is clinically unlikely].  Given the age/and comorbidities I think is reasonable to hold off of bone marrow biopsy at this time.    # Recommend continued twice-daily aspirin ; along with institution of Hydrea  to better control  the blood counts continue Hydrea  500 mg once a day.  # History of stroke/TIA- [Limited mobility]-currently on aspirin .  However would recommend aspirin  81 mg twice a day  # Dental extraction- s/p extraction-discussed with dentistry.  No significant bleeding noted-however noted to have slightly higher bleeding tendency postextraction than other regular patients.  Patient currently does not have any urgent dental issues.  Recommend holding off extraction until at least next visit.  Discussed with patient dentist  # DISPOSITION: # labs today- cbc/cmp; # follow up in appx 4  weeks- MD;  labs- cbc/cmp Dr.B  I spoke at length with the patient's daughter, Wanda regarding the patient's clinical status/plan of care.  Family agreement. Also discussed with patients dentist.        Cindy JONELLE Joe, MD 12/18/2024 12:07 PM

## 2024-12-18 NOTE — Telephone Encounter (Signed)
 Faxed orders to continue Hydrea  500mg  daily and Increase aspirin  81 mg to Twice a day to Nurse at [facility]. Called daughter to inform her of improvement in platelet count with above. Also informed her of Dad's next appt at cancer center.

## 2024-12-19 ENCOUNTER — Non-Acute Institutional Stay (SKILLED_NURSING_FACILITY): Payer: Self-pay | Admitting: Orthopedic Surgery

## 2024-12-19 ENCOUNTER — Encounter: Payer: Self-pay | Admitting: Orthopedic Surgery

## 2024-12-19 DIAGNOSIS — N1831 Chronic kidney disease, stage 3a: Secondary | ICD-10-CM

## 2024-12-19 DIAGNOSIS — M4712 Other spondylosis with myelopathy, cervical region: Secondary | ICD-10-CM

## 2024-12-19 DIAGNOSIS — H906 Mixed conductive and sensorineural hearing loss, bilateral: Secondary | ICD-10-CM

## 2024-12-19 DIAGNOSIS — Z936 Other artificial openings of urinary tract status: Secondary | ICD-10-CM | POA: Diagnosis not present

## 2024-12-19 DIAGNOSIS — I10 Essential (primary) hypertension: Secondary | ICD-10-CM | POA: Diagnosis not present

## 2024-12-19 DIAGNOSIS — E782 Mixed hyperlipidemia: Secondary | ICD-10-CM | POA: Diagnosis not present

## 2024-12-19 DIAGNOSIS — D75839 Thrombocytosis, unspecified: Secondary | ICD-10-CM

## 2024-12-19 NOTE — Progress Notes (Signed)
 " Location:  Other [place].  Nursing Home Room Number: [facility] Place of Service:  SNF [id number]) Provider:  Greig Cluster, NP  PCP: [name], DO  Patient Care Team: [name], DO as PCP - General (Internal Medicine) [name], MD as Consulting Physician (Urology) [name], MD as Consulting Physician (Oncology) Marea, Selinda RAMAN, MD as Referring Physician (Vascular Surgery) Rennie Cindy SAUNDERS, MD as Consulting Physician (Oncology)  Extended Emergency Contact Information Primary Emergency Contact: [name],[name] Home Phone: [phone] Mobile Phone: [phone] Relation: Daughter Secondary Emergency Contact: warner,[name] Mobile Phone: [phone] Relation: Daughter Interpreter needed? No  Code Status:  DNR Goals of care: Advanced Directive information    12/18/2024   10:08 AM  Advanced Directives  Does Patient Have a Medical Advance Directive? Yes  Type of Estate Agent of [place];Living will;Out of facility DNR (pink MOST or yellow form)  Does patient want to make changes to medical advance directive? No - Patient declined  Copy of Healthcare Power of Attorney in Chart? Yes - validated most recent copy scanned in chart (See row information)  Would patient like information on creating a medical advance directive? No - Patient declined     Chief Complaint  Patient presents with   Medical Management of Chronic Issues    Medical Management of Chronic Issues.     HPI:  Pt is a 84 y.o. male seen today for medical management of chronic diseases.    He currently resides on the skilled nursing unit at [facility]. PMH: HTV, 1st degree AV block, HTN, HLD, cervical spondylosis, hemiparesis s/p CVA, CKD, thrombocytosis, bladder cancer s/p urostomy, prediabetes, vitamin D  deficiency.  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 No concerns. No recent falls or injuries. 01/15 dental exam> no new orders. Recent BIMS score 15/15 11/09/2024.   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 chin. Nodular and infiltrative patterns.EDC.   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 Nodular. EDC   Basal cell carcinoma 08/26/2020   Right mid to inf. ear helix. Nodular, ulcerated. 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 Ulcerated.   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   L upper eyelid, exc and ED, pt had BCC of L upper eyelid and excision also included this SCC    Thrombocythemia 11/27/2024   Past Surgical History:  Procedure Laterality Date   BACK SURGERY     CATARACT EXTRACTION     HERNIA REPAIR     PORTA CATH INSERTION N/A 01/01/2019   Procedure: PORTA CATH INSERTION;  Surgeon: [name], MD;  Location: [facility] INVASIVE CV LAB;  Service: Cardiovascular;  Laterality: N/A;   TONSILLECTOMY     TRANSURETHRAL RESECTION OF BLADDER TUMOR N/A 12/08/2018   Procedure: TRANSURETHRAL RESECTION OF BLADDER TUMOR (TURBT);  Surgeon: [name], MD;  Location: [facility] ORS;  Service: Urology;  Laterality: N/A;    Allergies[1]  Outpatient Encounter Medications as of 12/19/2024  Medication Sig   acetaminophen  (TYLENOL ) 500 MG tablet Take 1,000 mg by mouth every 8 (eight) hours as needed.   aspirin  EC 81 MG tablet Take 81 mg by mouth in the morning and at bedtime. Swallow whole.   atorvastatin  (LIPITOR) 40 MG tablet Take 1 tablet (40 mg total) by mouth at bedtime.   chlorhexidine (PERIDEX) 0.12 % solution Use as directed in the mouth or throat at bedtime. 0.5 ounce by mouth at bedtime for mouth care (swish for 30 seconds and spit after evening mouth care. DO NOT RINSE.)   hydrocortisone cream 1 % Apply 1 Application topically every 12 (twelve) hours as needed for itching.   hydroxyurea  (HYDREA ) 500 MG capsule Take 1 capsule (500 mg total) by mouth daily. May take with food to minimize GI side effects.   losartan (COZAAR) 25 MG tablet Take 25 mg by mouth daily.   magnesium  chloride (SLOW-MAG) 64 MG TBEC SR tablet Take 1 tablet by mouth daily.   Multiple Vitamins-Minerals (CENTRUM MEN) TABS Take 1 tablet by mouth daily.   polyethylene glycol (MIRALAX  / GLYCOLAX ) 17 g packet Take 17 g by mouth daily as needed.   Sodium Fluoride (PREVIDENT 5000 BOOSTER PLUS) 1.1 % PSTE Place 1 Application onto teeth at bedtime.   Throat Lozenges (COUGH DROPS) LOZG Use as directed in the mouth or throat. 1 unit by mouth every 1 hour as needed for cough   Vitamin D , Ergocalciferol ,  (DRISDOL ) 1.25 MG (50000 UT) CAPS capsule Take 50,000 Units by mouth every 14 (fourteen) days.   aluminum-magnesium  hydroxide 200-200 MG/5ML suspension Take 10 mLs by mouth every 4 (four) hours as needed for indigestion. (Patient not taking: Reported on 12/19/2024)   No facility-administered encounter medications on file as of 12/19/2024.    Review of Systems  Constitutional:  Negative for fatigue and fever.  HENT:  Negative for sore throat and trouble swallowing.   Eyes:  Negative for visual disturbance.  Respiratory:  Negative for cough and shortness of breath.   Cardiovascular:  Negative for chest pain and leg swelling.  Gastrointestinal:  Positive for constipation. Negative for abdominal distention, abdominal pain, diarrhea, nausea and vomiting.  Genitourinary:  Negative for dysuria and frequency.  Musculoskeletal:  Positive for back pain and gait problem.  Skin:  Negative for wound.  Neurological:  Positive for weakness. Negative for dizziness and headaches.  Psychiatric/Behavioral:  Negative for confusion, dysphoric mood and sleep disturbance. The patient is not nervous/anxious.     Immunization History  Administered Date(s)  Administered   Influenza Inj Mdck Quad Pf 08/25/2021, 08/31/2022   Influenza-Unspecified 08/27/2014, 09/03/2015, 08/31/2016, 08/29/2017, 09/21/2023, 09/11/2024, 09/11/2024   Moderna Covid-19 Fall Seasonal Vaccine 21yrs & older 03/08/2023   Moderna Sars-Covid-2 Vaccination 12/11/2019, 01/08/2020, 10/08/2020, 08/20/2021   Pneumococcal Conjugate-13 03/21/2014, 06/02/2017   Pneumococcal Polysaccharide-23 04/08/2016   Tdap 12/05/2017   Unspecified SARS-COV-2 Vaccination 08/26/2023, 03/09/2024, 09/21/2024   Pertinent  Health Maintenance Due  Topic Date Due   Influenza Vaccine  Completed      11/24/2022   10:00 PM 11/25/2022    8:30 PM 11/26/2022    9:00 AM 04/24/2023    9:06 PM 03/15/2024    3:16 PM  Fall Risk  Falls in the past year?    1 1  Was there an  injury with Fall?    1  0   Fall Risk Category Calculator    3 1  (RETIRED) Patient Fall Risk Level High fall risk  High fall risk  High fall risk     Patient at Risk for Falls Due to    History of fall(s);Impaired balance/gait;Impaired mobility History of fall(s);Impaired mobility  Fall risk Follow up    Falls prevention discussed Falls evaluation completed;Education provided     Data saved with a previous flowsheet row definition   Functional Status Survey:    Vitals:   12/19/24 1030  BP: (!) 100/56  Pulse: 69  Resp: 18  Temp: 98.1 F (36.7 C)  SpO2: 96%  Weight: 184 lb 12.8 oz (83.8 kg)  Height: 5' 8 (1.727 m)   Body mass index is 28.1 kg/m. Physical Exam Vitals reviewed.  Constitutional:      General: He is not in acute distress. HENT:     Head: Normocephalic.     Right Ear: There is no impacted cerumen.     Left Ear: There is no impacted cerumen.     Ears:     Comments: Bilateral hearing aids    Nose: Nose normal.     Mouth/Throat:     Mouth: Mucous membranes are moist.     Pharynx: No posterior oropharyngeal erythema.  Eyes:     General:        Right eye: No discharge.        Left eye: No discharge.  Cardiovascular:     Rate and Rhythm: Normal rate and regular rhythm.     Pulses: Normal pulses.     Heart sounds: Normal heart sounds.  Pulmonary:     Effort: Pulmonary effort is normal.     Breath sounds: Normal breath sounds.  Abdominal:     General: Bowel sounds are normal. There is no distension.     Palpations: Abdomen is soft.     Tenderness: There is no abdominal tenderness.  Genitourinary:    Comments: RLQ urostomy present, surrounding skin intact Musculoskeletal:     Cervical back: Neck supple.     Right lower leg: No edema.     Left lower leg: No edema.  Skin:    General: Skin is warm.     Capillary Refill: Capillary refill takes less than 2 seconds.     Comments: Multiple senile purpura, very in size on extremities  Neurological:      General: No focal deficit present.     Mental Status: He is alert and oriented to person, place, and time.     Gait: Gait abnormal.     Comments: rolator  Psychiatric:        Mood  and Affect: Mood normal.     Labs reviewed: Recent Labs    11/19/24 0000 11/27/24 1204 12/18/24 1056  NA 134* 131* 133*  K 5.4* 4.7 4.8  CL 97* 94* 97*  CO2 28* 26 29  GLUCOSE  --  87 98  BUN 20 17 19   CREATININE 1.2 1.18 1.19  CALCIUM  9.6 9.8 9.7   Recent Labs    10/29/24 0000 11/27/24 1204 12/18/24 1056  AST 15 24 17   ALT 15 18 12   ALKPHOS 98 121 118  BILITOT  --  0.4 0.6  PROT  --  7.3 7.0  ALBUMIN  3.5 3.3* 3.5   Recent Labs    10/29/24 0000 11/27/24 1204 12/18/24 1056  WBC 10.6 12.4* 9.9  NEUTROABS 7,166.00 9.4* 7.4  HGB 14.2 12.9* 13.0  HCT 44 39.7 39.9  MCV  --  88.4 88.7  PLT 759* 1,038* 577*   No results found for: TSH Lab Results  Component Value Date   HGBA1C 5.6 11/15/2023   Lab Results  Component Value Date   CHOL 154 04/02/2024   HDL 41 04/02/2024   LDLCALC 94 04/02/2024   TRIG 97 04/02/2024   CHOLHDL 3.5 11/15/2023    Significant Diagnostic Results in last 30 days:  No results found.  Assessment/Plan 1. Thrombocythemia (Primary) - followed by hematology - recent platelets 577 12/18/2024 - cont hydroxyurea  and asa  2. Essential hypertension - controlled, goal < 150/90 - cont losartan  3. Mixed hyperlipidemia - LDL stable with atorvastatin   4. Chronic kidney disease, stage 3a (HCC) - encourage hydration with water - avoid NSAIDS  5. Presence of urostomy [facility]) - s/p cystectomy 2020 - cont daily care   6. Cervical spondylosis with myelopathy - denies increased pain  - cont tylenol  prn   7. Mixed conductive and sensorineural hearing loss of both ears - cont bilateral hearing aids    Family/ staff Communication: plan discussed with patient and nurse  Labs/tests ordered:  none        [1] No Known Allergies  "

## 2025-01-07 ENCOUNTER — Ambulatory Visit: Admitting: Dermatology

## 2025-01-15 ENCOUNTER — Inpatient Hospital Stay

## 2025-01-15 ENCOUNTER — Inpatient Hospital Stay: Admitting: Internal Medicine
